# Patient Record
Sex: Male | Born: 1940
Health system: Southern US, Community
[De-identification: ages and names within clinical notes are randomized; demographics above are authoritative.]

## PROBLEM LIST (undated history)

## (undated) DIAGNOSIS — K5720 Diverticulitis of large intestine with perforation and abscess without bleeding: Secondary | ICD-10-CM

## (undated) DIAGNOSIS — I2510 Atherosclerotic heart disease of native coronary artery without angina pectoris: Secondary | ICD-10-CM

## (undated) DIAGNOSIS — E785 Hyperlipidemia, unspecified: Secondary | ICD-10-CM

## (undated) DIAGNOSIS — C679 Malignant neoplasm of bladder, unspecified: Secondary | ICD-10-CM

## (undated) DIAGNOSIS — K5792 Diverticulitis of intestine, part unspecified, without perforation or abscess without bleeding: Secondary | ICD-10-CM

## (undated) DIAGNOSIS — T8859XA Other complications of anesthesia, initial encounter: Secondary | ICD-10-CM

## (undated) DIAGNOSIS — C4490 Unspecified malignant neoplasm of skin, unspecified: Secondary | ICD-10-CM

## (undated) DIAGNOSIS — I6529 Occlusion and stenosis of unspecified carotid artery: Secondary | ICD-10-CM

## (undated) DIAGNOSIS — I739 Peripheral vascular disease, unspecified: Secondary | ICD-10-CM

## (undated) DIAGNOSIS — G458 Other transient cerebral ischemic attacks and related syndromes: Secondary | ICD-10-CM

## (undated) DIAGNOSIS — Z5320 Procedure and treatment not carried out because of patient's decision for unspecified reasons: Secondary | ICD-10-CM

## (undated) DIAGNOSIS — L02211 Cutaneous abscess of abdominal wall: Secondary | ICD-10-CM

## (undated) DIAGNOSIS — IMO0001 Reserved for inherently not codable concepts without codable children: Secondary | ICD-10-CM

## (undated) DIAGNOSIS — I10 Essential (primary) hypertension: Secondary | ICD-10-CM

## (undated) HISTORY — PX: APPENDECTOMY: SHX54

## (undated) HISTORY — DX: Unspecified malignant neoplasm of skin, unspecified: C44.90

## (undated) HISTORY — DX: Cutaneous abscess of abdominal wall: L02.211

## (undated) HISTORY — DX: Diverticulitis of large intestine with perforation and abscess without bleeding: K57.20

## (undated) HISTORY — DX: Peripheral vascular disease, unspecified: I73.9

## (undated) HISTORY — PX: CYSTOSCOPY WITH INJECTION: SHX1424

## (undated) HISTORY — DX: Other transient cerebral ischemic attacks and related syndromes: G45.8

## (undated) HISTORY — DX: Reserved for inherently not codable concepts without codable children: IMO0001

## (undated) HISTORY — DX: Atherosclerotic heart disease of native coronary artery without angina pectoris: I25.10

## (undated) HISTORY — DX: Procedure and treatment not carried out because of patient's decision for unspecified reasons: Z53.20

## (undated) HISTORY — DX: Essential (primary) hypertension: I10

## (undated) HISTORY — DX: Occlusion and stenosis of unspecified carotid artery: I65.29

## (undated) HISTORY — PX: TONSILLECTOMY: SUR1361

## (undated) HISTORY — DX: Hyperlipidemia, unspecified: E78.5

## (undated) HISTORY — PX: ROTATOR CUFF REPAIR: SHX139

## (undated) HISTORY — DX: Diverticulitis of intestine, part unspecified, without perforation or abscess without bleeding: K57.92

## (undated) SURGERY — VIDEO BRONCHOSCOPY WITHOUT FLUORO
Anesthesia: General | Laterality: Right

---

## 1998-03-21 ENCOUNTER — Ambulatory Visit (HOSPITAL_BASED_OUTPATIENT_CLINIC_OR_DEPARTMENT_OTHER): Admission: RE | Admit: 1998-03-21 | Discharge: 1998-03-21 | Payer: Self-pay | Admitting: Urology

## 1998-03-21 ENCOUNTER — Emergency Department (HOSPITAL_COMMUNITY): Admission: EM | Admit: 1998-03-21 | Discharge: 1998-03-21 | Payer: Self-pay | Admitting: Emergency Medicine

## 2000-04-16 HISTORY — PX: CARDIAC CATHETERIZATION: SHX172

## 2000-08-20 ENCOUNTER — Ambulatory Visit (HOSPITAL_COMMUNITY): Admission: RE | Admit: 2000-08-20 | Discharge: 2000-08-20 | Payer: Self-pay | Admitting: Cardiology

## 2005-02-21 ENCOUNTER — Ambulatory Visit (HOSPITAL_BASED_OUTPATIENT_CLINIC_OR_DEPARTMENT_OTHER): Admission: RE | Admit: 2005-02-21 | Discharge: 2005-02-21 | Payer: Self-pay | Admitting: Urology

## 2005-02-21 ENCOUNTER — Encounter (INDEPENDENT_AMBULATORY_CARE_PROVIDER_SITE_OTHER): Payer: Self-pay | Admitting: Specialist

## 2005-02-21 ENCOUNTER — Ambulatory Visit (HOSPITAL_COMMUNITY): Admission: RE | Admit: 2005-02-21 | Discharge: 2005-02-21 | Payer: Self-pay | Admitting: Urology

## 2007-03-24 ENCOUNTER — Ambulatory Visit: Payer: Self-pay | Admitting: Vascular Surgery

## 2008-04-16 DIAGNOSIS — IMO0001 Reserved for inherently not codable concepts without codable children: Secondary | ICD-10-CM

## 2008-04-16 HISTORY — DX: Reserved for inherently not codable concepts without codable children: IMO0001

## 2009-11-21 ENCOUNTER — Ambulatory Visit: Payer: Self-pay | Admitting: Cardiology

## 2010-07-31 ENCOUNTER — Other Ambulatory Visit: Payer: Self-pay | Admitting: Dermatology

## 2010-08-29 NOTE — Procedures (Signed)
CAROTID DUPLEX EXAM   INDICATION:  Bilateral arm pressure differences.   HISTORY:  Diabetes:  No.  Cardiac:  No.  Hypertension:  Yes.  Smoking:  Pipe.  Previous Surgery:  CV History:  Amaurosis Fugax No, Paresthesias No, Hemiparesis No                                       RIGHT             LEFT  Brachial systolic pressure:         158               110  Brachial Doppler waveforms:         Biphasic          Monophasic  Vertebral direction of flow:        Antegrade         Retrograde  DUPLEX VELOCITIES (cm/sec)  CCA peak systolic                   98                81  ECA peak systolic                   122               111  ICA peak systolic                   107               74  ICA end diastolic                   40                30  PLAQUE MORPHOLOGY:                  heterogeneous     heterogeneous  PLAQUE AMOUNT:                      Mild              Mild  PLAQUE LOCATION:                    ICA and ECA   IMPRESSION:  1. Possibly greater than 80% stenosis noted in the proximal left      subclavian artery (623/133).  2. 1-39% stenosis noted in bilateral ICAs.  3. Retrograde left vertebral artery.   ___________________________________________  Janetta Hora Fields, MD   MG/MEDQ  D:  03/24/2007  T:  03/25/2007  Job:  [id number]

## 2010-09-01 NOTE — H&P (Signed)
[place]  Patient:    [name], [name]                        MRN: [id number] Adm. Date:  08/20/00 Attending:  Colleen Can. Deborah Chalk, M.D. Dictator:   Jennet Maduro. Earl Gala, R.N., A.N.P. CC:         Maretta Bees. Vonita Moss, M.D.                         History and Physical  DATE OF BIRTH:  [birth year]  CHIEF COMPLAINT:  Palpitations with subsequent abnormal stress echocardiogram.  HISTORY OF PRESENT ILLNESS:  [name] is a pleasant 70 year old male who basically has had no significant cardiac history except for hypertension.  He currently had not been taking any medicines.  He has had a four to six week period of having increase in palpitations which he describes as a forceful heartbeat as well as tachycardia.  He had a 24 hour Holter monitor placed which basically showed sinus rhythm.  His average heart rate was 84 beats a minute.  There were PACs and PVCs noted, but mainly single ectopics.  There were no atrial runs.  His maximal heart rate was 147.  There were no ST changes.  The only time that the patient noted palpitations that was noted on the diary there were no ectopics on the tracings able to be discerned.  He was referred on for a stress echocardiogram which was performed on Aug 19, 2000. With this he demonstrated very poor exercise tolerance.  There was no EKG changes, yet echocardiographically there was an abnormal wall motion abnormality of the distal septum.  In light of these findings he is now referred for cardiac catheterization.  PAST MEDICAL HISTORY: 1. Hypertension. 2. History of fever in 1946, questionable rheumatic fever in origin. 3. Bladder cancer followed by Dr. Vonita Moss. 4. Diverticulitis.  ALLERGIES:  None.  CURRENT MEDICATIONS:  Toprol XL 50 mg q.d.  FAMILY HISTORY:  Both of his parents are deceased.  Father died age 85 with COPD.  Mother died at 58 with history of hypertension and actually died from lung cancer.  SOCIAL  HISTORY:  He is married.  He is employed at the maintenance at [employer].  There has been no tobacco products for the past four years but previous to that he smoked for 40 years.  There is no alcohol use.  REVIEW OF SYSTEMS:  Otherwise unremarkable.  PHYSICAL EXAMINATION  GENERAL:  He is an alert white male, somewhat thin.  He is in no acute distress.  VITAL SIGNS:  Weight 172 pounds, blood pressure 170/100 sitting, 140/100 standing, heart rate 88 with occasional ectopy, respirations 18.  He is afebrile.  SKIN:  Warm and dry.  Color is unremarkable.  He is sun tanned.  NECK:  Supple.  No JVD.  LUNGS:  Basically clear.  HEART:  Regular rhythm.  There was no murmur or rumble that could be appreciated.  ABDOMEN:  Soft, positive bowel sounds, nontender.  EXTREMITIES:  Without edema.  LABORATORIES:  Currently pending.  Previous cholesterol panel showed total cholesterol 199, HDL 42, LDL 126, triglyceride level 157.  IMPRESSION: 1. Palpitations with a basically negative Holter monitor with subsequent    abnormal stress echocardiogram. 2. Hypertension.  PLAN:  Will continue the Toprol XL 50 mg q.d.  Will place him on aspirin q.d. as well.  He is referred on for elective  cardiac catheterization.  The risks, procedure, and benefits are discussed in full detail and he is going to proceed on Aug 20, 2000. DD:  08/19/00 TD:  08/19/00 Job: [id number]

## 2010-09-01 NOTE — Op Note (Signed)
[name], [name]               ACCOUNT NO.:  [id number]   MEDICAL RECORD NO.:  [id number]          PATIENT TYPE:  AMB   LOCATION:  [facility]                         FACILITY:  [facility]   PHYSICIAN:  Maretta Bees. Vonita Moss, M.D.DATE OF BIRTH:  October 13, 1940   DATE OF PROCEDURE:  02/21/2005  DATE OF DISCHARGE:                                 OPERATIVE REPORT   PREOPERATIVE DIAGNOSIS:  Recurrent bladder carcinoma.   POSTOPERATIVE DIAGNOSIS:  Not given.   PROCEDURE:  Cystoscopy, resection bladder tumor and bilateral retrograde  pyelograms with interpretation.   SURGEON:  Dr. Larey Dresser.   ANESTHESIA:  General.   INDICATIONS:  This 70 year old gentleman has had bladder tumors resected in  November 1998, March 1999 and November 1999 followed by a 6 week course of  therapy with BCG. He has been tumor free until a recent cystoscopy showed a  small papillary tumor that needs resection.   DESCRIPTION OF PROCEDURE:  The patient was brought to the operating room and  placed in lithotomy position. The external genitalia prepped and draped in  the usual fashion. He was cystoscoped, the anterior and prostatic urethras  were unremarkable. The only lesion was a small 1/2 cm papillary tumor on the  anterior wall.   Bilateral retrograde pyelograms were obtained using the cone-tip catheters  and the pyelocaliceal systems were delicate and without filling defects and  the ureters were nonobstructed and without filling defects.   The cold cup bladder biopsy forceps was utilized to completely remove this  1/2 cm tumor on the anterior wall and the biopsy site fulgurated with the  Bugbee electrode. The bladder was emptied, the scope removed and the patient  sent to the recovery room in good condition having tolerated the procedure  well.      Maretta Bees. Vonita Moss, M.D.  Electronically Signed     LJP/MEDQ  D:  02/21/2005  T:  02/21/2005  Job:  [id number]

## 2010-09-01 NOTE — Cardiovascular Report (Signed)
[place]  Patient:    NEEV, MCMAINS                      MRN: [id number] Proc. Date: 08/20/00 Adm. Date:  [id number] Attending:  Eleanora Neighbor                        Cardiac Catheterization  HISTORY:  Mr. Stammer has a history of hypertension and palpitations.  He has a history of previous bladder cancer.  He was referred for catheterization after having an abnormal exercise test with distal septal hypokinesia and poor exercise tolerance.  He had no EKG changes with exercise.  PROCEDURE:  Left heart catheterization with selective coronary angiography and left ventricular angiography with Perclose.  CARDIOLOGIST:  Colleen Can. Deborah Chalk, M.D.  TYPE AND SITE OF ENTRY:  Percutaneous right femoral artery.  CATHETERS:  6-French 4 curved Judkins right and left coronary catheters, 6-French pigtail ventricular guiding catheter.  MEDICATIONS GIVEN PRIOR TO PROCEDURE:  Valium 10 mg p.o.  MEDICATIONS GIVEN DURING PROCEDURE:  Versed 2 mg IV.  COMMENTS:  The patient tolerated the procedure well.  HEMODYNAMIC DATA:  The aortic pressure was 99/70.  LV was 103/8.  There was no aortic valve gradient noted on pullback.  ANGIOGRAPHIC DATA:  LEFT VENTRICULAR ANGIOGRAM:  The left ventricular angiogram was performed in the RAO position.  Overall cardiac size and silhouette were normal.  Left ventricular function is normal.  CORONARY ARTERIES:  The coronary arteries arise and distribute normally. 1. There is some mild narrowing in the right coronary artery that would be    less than 10 to 20%.  It is in the continuation portion of the right    coronary artery just prior to the acute margin. 2. Left main coronary artery is normal. 3. Left circumflex:  The left circumflex has some mild proximal narrowing.    It bifurcates within 1 cm of its origin into a large high obtuse marginal    and a large second obtuse marginal.  There are some mild  irregularities    but no significant focal disease. 4. Left anterior descending: The left anterior descending has mild scattered    irregularities, all being less than 10%; however, mild irregularities and    luminal are noted.  OVERALL IMPRESSION: 1. Normal left ventricular function. 2. Mild coronary artery disease.  DISCUSSION:  These findings would suggest that the degree of coronary disease that Mr. Quinney has does not contribute to any of his symptoms.  He would be at low risk, but it should be noted that he does have irregularities in the vessels. DD:  08/20/00 TD:  08/20/00 Job: [id number]

## 2010-11-20 ENCOUNTER — Encounter: Payer: Self-pay | Admitting: Nurse Practitioner

## 2010-11-27 ENCOUNTER — Encounter: Payer: Self-pay | Admitting: Nurse Practitioner

## 2010-11-27 ENCOUNTER — Ambulatory Visit (INDEPENDENT_AMBULATORY_CARE_PROVIDER_SITE_OTHER): Payer: Medicare Other | Admitting: Nurse Practitioner

## 2010-11-27 VITALS — BP 138/86 | HR 78 | Ht 74.0 in | Wt 159.6 lb

## 2010-11-27 DIAGNOSIS — I2510 Atherosclerotic heart disease of native coronary artery without angina pectoris: Secondary | ICD-10-CM

## 2010-11-27 DIAGNOSIS — E785 Hyperlipidemia, unspecified: Secondary | ICD-10-CM | POA: Insufficient documentation

## 2010-11-27 DIAGNOSIS — G458 Other transient cerebral ischemic attacks and related syndromes: Secondary | ICD-10-CM

## 2010-11-27 DIAGNOSIS — I498 Other specified cardiac arrhythmias: Secondary | ICD-10-CM

## 2010-11-27 DIAGNOSIS — I10 Essential (primary) hypertension: Secondary | ICD-10-CM | POA: Insufficient documentation

## 2010-11-27 DIAGNOSIS — R001 Bradycardia, unspecified: Secondary | ICD-10-CM | POA: Insufficient documentation

## 2010-11-27 MED ORDER — SIMVASTATIN 40 MG PO TABS: 40.0000 mg | ORAL_TABLET | Freq: Every day | ORAL | Status: DC

## 2010-11-27 MED ORDER — METOPROLOL TARTRATE 50 MG PO TABS: 50.0000 mg | ORAL_TABLET | Freq: Two times a day (BID) | ORAL | Status: DC

## 2010-11-27 NOTE — Progress Notes (Signed)
[name] Date of Birth: January 05, 1941   History of Present Illness: [name] is seen today for a one year visit. He is seen for [name]. He is a former patient of [name]. He continues to do well. He denies chest pain or shortness of breath. No left arm pain. Blood pressure has been good at home. He notes some low heart rates but he is totally asymptomatic. He continues to walk 2 miles each day. He is not dizzy. He tolerates his medicines. He just celebrated his 50th wedding anniversary with a trip to [place].  Current Outpatient Prescriptions on File Prior to Visit  Medication Sig Dispense Refill  . aspirin 81 MG tablet Take 81 mg by mouth daily.        Marland Kitchen BIOTIN PO Take by mouth.        . Cyanocobalamin (VITAMIN B 12 PO) Take by mouth.        . fish oil-omega-3 fatty acids 1000 MG capsule Take 2 g by mouth daily.        Marland Kitchen glipiZIDE (GLUCOTROL) 5 MG tablet Take 5 mg by mouth 2 (two) times daily before a meal.        . Multiple Vitamin (MULTIVITAMIN PO) Take by mouth.        . DISCONTD: simvastatin (ZOCOR) 40 MG tablet Take 40 mg by mouth daily.        Marland Kitchen dicyclomine (BENTYL) 20 MG tablet Take 20 mg by mouth as needed.        Marland Kitchen DISCONTD: metoprolol (LOPRESSOR) 50 MG tablet Take 50 mg by mouth 2 (two) times daily.          No Known Allergies  Past Medical History  Diagnosis Date  . Diabetes mellitus   . Hypertension   . CAD (coronary artery disease)     Mild per cath in 2002  . Hyperlipidemia     Is on statin therapy  . Diverticulitis   . Skin cancer   . Hx of bladder cancer   . Normal nuclear stress test 2010    EF 63% with no ischemia  . Subclavian steal syndrome     left; evaluated by [name]; managed medically    Past Surgical History  Procedure Date  . Cardiac catheterization 2002    He had a which showed mild coronary atherosclerosis and normal left ventricular function.    History  Smoking status  . Never Smoker   Smokeless tobacco  . Not on  file    History  Alcohol Use No    Family History  Problem Relation Age of Onset  . Hypertension Mother   . Lung cancer Mother     Review of Systems: The review of systems is as above. No chest pain, shortness of breath. No cough or edema.  All other systems were reviewed and are negative.  Physical Exam: BP 138/86  Pulse 78  Ht 6\' 2"  (1.88 m)  Wt 159 lb 9.6 oz (72.394 kg)  BMI 20.49 kg/m2 Patient is very pleasant and in no acute distress. Skin is warm and dry. Color is normal.  HEENT is unremarkable. Normocephalic/atraumatic. PERRL. Sclera are nonicteric. Neck is supple. No masses. No JVD. Lungs are clear. Cardiac exam shows a regular rate and rhythm. He is bradycardic on exam. Abdomen is soft. Extremities are without edema. Gait and ROM are intact. No gross neurologic deficits noted.  LABORATORY DATA: Recent labs from his primary care are reviewed. Lipids are excellent.  EKG shows sinus bradycardia.   Assessment / Plan:

## 2010-11-27 NOTE — Assessment & Plan Note (Signed)
Lipids are excellent. No change in his therapy.

## 2010-11-27 NOTE — Assessment & Plan Note (Signed)
Blood pressure has been good at home and is satisfactory here today. No change in his medicines. He will continue to monitor.

## 2010-11-27 NOTE — Assessment & Plan Note (Signed)
He has a resting bradycardia. He is on metoprolol. He is totally asymptomatic. He will continue to monitor his heart rate with his blood pressure at home. He will let us know if he has symptoms. For now, no change in his medicines.

## 2010-11-27 NOTE — Assessment & Plan Note (Signed)
Remote cath in 2002. Last nuclear was in 2010. He remains asymptomatic. Will continue with his current medicines. May consider repeat stress testing on return. He remains active and exercises routinely. I will have him see [name] in one year. Patient is agreeable to this plan and will call if any problems develop in the interim.

## 2010-11-27 NOTE — Assessment & Plan Note (Signed)
He was evaluated by [name] back in 2009. He has been managed with blood pressure control and lipid lowering.

## 2010-11-27 NOTE — Patient Instructions (Signed)
Stay on your current medicines Keep up with your current exercise program I will have you see Dr. Marca Ancona in one year Let me know if you have any dizzy spells or lightheadedness

## 2010-11-28 ENCOUNTER — Encounter: Payer: Self-pay | Admitting: Nurse Practitioner

## 2010-11-28 NOTE — Progress Notes (Signed)
Agree with above note. [name] [employer]

## 2011-04-02 ENCOUNTER — Encounter: Payer: Self-pay | Admitting: Nurse Practitioner

## 2011-04-24 DIAGNOSIS — Z87440 Personal history of urinary (tract) infections: Secondary | ICD-10-CM | POA: Diagnosis not present

## 2011-04-24 DIAGNOSIS — R6882 Decreased libido: Secondary | ICD-10-CM | POA: Diagnosis not present

## 2011-04-24 DIAGNOSIS — N401 Enlarged prostate with lower urinary tract symptoms: Secondary | ICD-10-CM | POA: Diagnosis not present

## 2011-04-30 ENCOUNTER — Other Ambulatory Visit: Payer: Self-pay | Admitting: Urology

## 2011-05-07 ENCOUNTER — Encounter (HOSPITAL_COMMUNITY): Payer: Self-pay | Admitting: Pharmacy Technician

## 2011-05-08 ENCOUNTER — Encounter: Payer: Self-pay | Admitting: Nurse Practitioner

## 2011-05-08 ENCOUNTER — Ambulatory Visit (INDEPENDENT_AMBULATORY_CARE_PROVIDER_SITE_OTHER): Payer: Medicare Other | Admitting: Nurse Practitioner

## 2011-05-08 VITALS — BP 134/70 | HR 48 | Ht 74.0 in | Wt 161.0 lb

## 2011-05-08 DIAGNOSIS — E785 Hyperlipidemia, unspecified: Secondary | ICD-10-CM

## 2011-05-08 DIAGNOSIS — I2510 Atherosclerotic heart disease of native coronary artery without angina pectoris: Secondary | ICD-10-CM | POA: Diagnosis not present

## 2011-05-08 DIAGNOSIS — R001 Bradycardia, unspecified: Secondary | ICD-10-CM

## 2011-05-08 DIAGNOSIS — Z01818 Encounter for other preprocedural examination: Secondary | ICD-10-CM

## 2011-05-08 DIAGNOSIS — I10 Essential (primary) hypertension: Secondary | ICD-10-CM

## 2011-05-08 DIAGNOSIS — I498 Other specified cardiac arrhythmias: Secondary | ICD-10-CM

## 2011-05-08 NOTE — Assessment & Plan Note (Signed)
Lipids are excellent on statin therapy.

## 2011-05-08 NOTE — Patient Instructions (Addendum)
I think you are doing well.   We will see you back at your regular time.   Call the [facility] office at [phone] if you have any questions, problems or concerns.

## 2011-05-08 NOTE — Assessment & Plan Note (Signed)
He is doing well from our standpoint. Should be at low risk for his planned TURP.

## 2011-05-08 NOTE — Assessment & Plan Note (Signed)
This is chronic. He is asymptomatic.

## 2011-05-08 NOTE — Assessment & Plan Note (Signed)
He is doing well clinically. Last nuclear was in 2010 and was normal. He is not wanting to have repeat stress testing. He remains very active and exercises regularly. No CHF. No valvular disease. His risk is felt to be low for surgery. It was explained to him however that complications may arise and he understands that.

## 2011-05-08 NOTE — Progress Notes (Signed)
Agree with note.  Should be reasonable risk to undergo TURP.  Does not need pre-op stress with no new symptoms and good exercise tolerance.

## 2011-05-08 NOTE — Assessment & Plan Note (Signed)
Repeat blood pressure by me is good. No change in his therapy.

## 2011-05-08 NOTE — Progress Notes (Signed)
[name] Date of Birth: January 23, 1941 Medical Record #[id number]  History of Present Illness: [name] is seen back today for a preop clearance visit. He is seen for [name]. He is a former patient of [name]. He has a known history of mild nonobstructive CAD from remote cath in 2002. His last stress test was in 2012 and was negative for ischemia with a normal EF. He has had chronic asymptomatic bradycardia. His other problems include hyperlipidemia, HTN and diabetes.  He comes in today. He is doing very well. He continues to walk 2 1/2 miles 5 days a week without any issue. He is not having any chest pain or arm pain. He is not lightheaded or dizzy. He feels good on his medicines. He brings in his labs which are very good. LDL is 51.  He is totally asymptomatic from a cardiac standpoint. He does have BPH and is planning on having a TURP with [name] next week.   Current Outpatient Prescriptions on File Prior to Visit  Medication Sig Dispense Refill  . aspirin 81 MG tablet Take 81 mg by mouth daily after breakfast.       . BIOTIN PO Take 1 tablet by mouth daily.       . Cyanocobalamin (VITAMIN B 12 PO) Take 500 mcg by mouth daily.       Marland Kitchen dicyclomine (BENTYL) 20 MG tablet Take 20 mg by mouth 4 (four) times daily as needed. Bladder spasm       . finasteride (PROSCAR) 5 MG tablet Take 5 mg by mouth daily.      . fish oil-omega-3 fatty acids 1000 MG capsule Take 1 g by mouth daily.       Marland Kitchen glipiZIDE (GLUCOTROL) 5 MG tablet Take 2.5 mg by mouth daily before breakfast.       . metoprolol (LOPRESSOR) 50 MG tablet Take 1 tablet (50 mg total) by mouth 2 (two) times daily.  60 tablet  11  . Multiple Vitamin (MULTIVITAMIN PO) Take 1 tablet by mouth daily.       . simvastatin (ZOCOR) 40 MG tablet Take 40 mg by mouth every evening.      . Tamsulosin HCl (FLOMAX) 0.4 MG CAPS Take 0.4 mg by mouth daily after supper.        No Known Allergies  Past Medical History  Diagnosis Date  .  Diabetes mellitus   . Hypertension   . CAD (coronary artery disease)     Mild per cath in 2002  . Hyperlipidemia     Is on statin therapy  . Diverticulitis   . Skin cancer   . Hx of bladder cancer   . Normal nuclear stress test 2010    EF 63% with no ischemia  . Subclavian steal syndrome     left; evaluated by [name]; managed medically    Past Surgical History  Procedure Date  . Cardiac catheterization 2002    He had a which showed mild coronary atherosclerosis and normal left ventricular function.    History  Smoking status  . Current Some Day Smoker  . Types: Pipe  Smokeless tobacco  . Not on file    History  Alcohol Use No    Family History  Problem Relation Age of Onset  . Hypertension Mother   . Lung cancer Mother     Review of Systems: The review of systems is positive for BPH.  All other systems were reviewed and are negative.  Physical Exam: BP 134/70  Pulse 48  Ht 6\' 2"  (1.88 m)  Wt 161 lb (73.029 kg)  BMI 20.67 kg/m2 Patient is very pleasant and in no acute distress. Skin is warm and dry. Color is normal.  HEENT is unremarkable. Normocephalic/atraumatic. PERRL. Sclera are nonicteric. Neck is supple. No masses. No JVD. Lungs are clear. Cardiac exam shows a regular rate and rhythm. Abdomen is soft. Extremities are without edema. Gait and ROM are intact. No gross neurologic deficits noted.   LABORATORY DATA: EKG today shows sinus bradycardia. Rate of 48. Tracing is unchanged from his prior tracings.    Assessment / Plan:

## 2011-05-14 ENCOUNTER — Encounter (HOSPITAL_COMMUNITY)
Admission: RE | Admit: 2011-05-14 | Discharge: 2011-05-14 | Disposition: A | Discharge status: Home / Self Care | Payer: Medicare Other | Source: Ambulatory Visit | Attending: Urology | Admitting: Urology

## 2011-05-14 ENCOUNTER — Encounter (HOSPITAL_COMMUNITY): Payer: Self-pay

## 2011-05-14 ENCOUNTER — Ambulatory Visit (HOSPITAL_COMMUNITY)
Admission: RE | Admit: 2011-05-14 | Discharge: 2011-05-14 | Disposition: A | Discharge status: Home / Self Care | Payer: Medicare Other | Source: Ambulatory Visit | Attending: Urology | Admitting: Urology

## 2011-05-14 DIAGNOSIS — Z01818 Encounter for other preprocedural examination: Secondary | ICD-10-CM | POA: Diagnosis not present

## 2011-05-14 DIAGNOSIS — J449 Chronic obstructive pulmonary disease, unspecified: Secondary | ICD-10-CM | POA: Diagnosis not present

## 2011-05-14 DIAGNOSIS — Z01811 Encounter for preprocedural respiratory examination: Secondary | ICD-10-CM | POA: Diagnosis not present

## 2011-05-14 DIAGNOSIS — Z01812 Encounter for preprocedural laboratory examination: Secondary | ICD-10-CM | POA: Diagnosis not present

## 2011-05-14 HISTORY — DX: Malignant neoplasm of bladder, unspecified: C67.9

## 2011-05-14 LAB — CBC
MCH: 33.1 pg (ref 26.0–34.0)
MCHC: 33.9 g/dL (ref 30.0–36.0)
MCV: 97.5 fL (ref 78.0–100.0)
Platelets: 261 10*3/uL (ref 150–400)
RDW: 12.8 % (ref 11.5–15.5)
WBC: 7.6 10*3/uL (ref 4.0–10.5)

## 2011-05-14 LAB — BASIC METABOLIC PANEL
Calcium: 9 mg/dL (ref 8.4–10.5)
Creatinine, Ser: 0.79 mg/dL (ref 0.50–1.35)
GFR calc Af Amer: >90 mL/min (ref >90)
GFR calc non Af Amer: 89 mL/min — ABNORMAL LOW (ref >90)

## 2011-05-14 NOTE — Patient Instructions (Signed)
[name]  05/14/2011   Your procedure is scheduled on:   05/17/11   Surgery   [id number]   Thursday  Report to [facility] at 0600 AM.  Call this number if you have problems the morning of surgery: [phone]   [facility]  PST [id number]   Remember:   Do not eat food:After Midnight.  Wednesday NIGHT  May have clear liquids:until Midnight .WEDNESDAY NIGHT   Clear liquids include soda, tea, black coffee, apple or grape juice, broth.  Take these medicines the morning of surgery with A SIP OF WATER: METOPROLOL WITH SIP WATER              NO BLOOD SUGAR MEDICINE AM OF SURGERY   Do not wear jewelry, make-up or nail polish.  Do not wear lotions, powders, or perfumes. You may wear deodorant.  Do not shave 48 hours prior to surgery.  Do not bring valuables to the hospital.  Contacts, dentures or bridgework may not be worn into surgery.  Leave suitcase in the car. After surgery it may be brought to your room.  For patients admitted to the hospital, checkout time is 11:00 AM the day of discharge.   Patients discharged the day of surgery will not be allowed to drive home.  Name and phone number of your driver: wife    [name]  Special Instructions: CHG Shower Use Special Wash: 1/2 bottle night before surgery and 1/2 bottle morning of surgery.   Regular soap face and privates    May shave face day of surgery   Please read over the following fact sheets that you were given: MRSA Information

## 2011-05-14 NOTE — Pre-Procedure Instructions (Signed)
Spoke with Pam at [facility] and asked her to notify Dr Annabell Howells that Mr Godman has not stopped his asa and vitamins- instructed patient no more- last dose this AM

## 2011-05-14 NOTE — H&P (Signed)
e Problems Problems  1. Benign Prostatic Hypertrophy With Urinary Obstruction 600.01 2. History of  Bladder Cancer V10.51 3. Organic Impotence 607.84  History of Present Illness  [name] returns today in f/u.   He was seen in October for f/u of bladder cancer and BPH with BOO.  He was on Rapaflo but was switched to tamsulosin and finasteride for progressive voiding symptoms.  He has not noticed a lot of improvement but has reduced nocturia to 0-1x from q1hr.  His PSA on therapy is 0.9.   His PVR is 57cc.   Past Medical History Problems  1. History of  Acute Urinary Retention 788.20 2. History of  Bladder Cancer V10.51 3. History of  Bladder Neck Contracture 596.0 4. History of  Diabetes Mellitus 250.00 5. History of  Heartburn 787.1 6. History of  Hypercholesterolemia 272.0 7. History of  Hypertension 401.9 8. History of  Pyuria 791.9 9. History of  Skin Cancer V10.83 10. History of  Urinary Tract Infection V13.02  Surgical History Problems  1. History of  Bladder Surgery 2. History of  Cystoscopy With Resection Of Tumor  Current Meds 1. Aspirin 81 MG Oral Tablet; Therapy: (Recorded:31Jan2008) to 2. Biotin TABS; Therapy: (Recorded:16Aug2012) to 3. Finasteride 5 MG Oral Tablet; TAKE 1 TABLET DAILY AS DIRECTED; Therapy: 01Oct2012 to  (Evaluate:26Sep2013)  Requested for: 01Oct2012; Last Rx:01Oct2012 4. Fish Oil CAPS; Therapy: (Recorded:16Aug2012) to 5. GlipiZIDE 5 MG Oral Tablet; Therapy: (Recorded:17May2010) to 6. Loratadine 10 MG Oral Tablet; Therapy: 16Aug2012 to 7. Multi-Vitamin Oral Tablet; Therapy: (Recorded:17May2010) to 8. Simvastatin 40 MG Oral Tablet; Therapy: (Recorded:17May2010) to 9. Tamsulosin HCl 0.4 MG Oral Capsule; TAKE 1 CAPSULE Daily with meals; Therapy: 01Oct2012  to (Evaluate:26Sep2013)  Requested for: 01Oct2012; Last Rx:01Oct2012 10. Toprol XL 50 MG Oral Tablet Extended Release 24 Hour; Therapy: (Recorded:31Jan2008) to 11. Vitamin B-12 ER TBCR;  Therapy: (Recorded:16Aug2012) to  Allergies Medication  1. No Known Drug Allergies  Social History Problems  1. Marital History - Currently Married 2. Tobacco Use V15.82 has smoked a pipe for 50 years Denied  3. History of  Alcohol Use  Review of Systems  Genitourinary: decreased libido.  ENT: nasal passage blockage (worse at night).  Cardiovascular: no leg swelling.  Endocrine: gynecomastia.    Vitals Vital Signs [Data Includes: Last 1 Day]  08Jan2013 09:55AM  Blood Pressure: 143 / 73 Temperature: 97.5 F Heart Rate: 44  Physical Exam Constitutional: Well nourished and well developed . No acute distress.  Pulmonary: No respiratory distress and normal respiratory rhythm and effort.  Cardiovascular: Heart rate and rhythm are normal . No peripheral edema.    Results/Data Urine [Data Includes: Last 1 Day]   08Jan2013  COLOR YELLOW   APPEARANCE CLEAR   SPECIFIC GRAVITY 1.010   pH 7.0   GLUCOSE NEG mg/dL  BILIRUBIN NEG   KETONE NEG mg/dL  BLOOD NEG   PROTEIN NEG mg/dL  UROBILINOGEN 0.2 mg/dL  NITRITE NEG   LEUKOCYTE ESTERASE NEG    The following clinical lab reports were reviewed:  PSA from 12/17 was 0.9.  Flow Rate: Voided 85 ml. A peak flow rate of 53ml/s, mean flow rate of 94ml/s and with a very erratic sawtooth pattern. flow curve .  PVR: Ultrasound PVR 57 ml. Prostate volume is 54cc.    Assessment Assessed  1. Benign Prostatic Hypertrophy With Urinary Obstruction 600.01 2. Urinary Tract Infection V13.02 3. Libido, Decreased 799.81   He has had improvement in the combination therapy but still has a slow  stream. he has a reduced libido on finasteride and nasal stuffiness on the tamsulosin.   Plan Benign Prostatic Hypertrophy With Urinary Obstruction (600.01)  1. Follow-up Month x 3 Office  Follow-up  Requested for: 08Jan2013 2. Complex Uroflowmetry  Requested for: 08Jan2013 3. IPSS Questionnarie  Requested for: 08Jan2013 4. PSA 5. PVR U/S  Requested for:  08Jan2013 Health Maintenance (V70.0)  6. UA With REFLEX  Done: 08Jan2013 09:39AM   I discussed continued medical therapy, CTT and TURP and reviewed the risks and benefits of each options. He has decided to proceed with a TURP.   Time in consultation was with >50% face to face.

## 2011-05-17 ENCOUNTER — Encounter (HOSPITAL_COMMUNITY): Payer: Self-pay | Admitting: *Deleted

## 2011-05-17 ENCOUNTER — Other Ambulatory Visit: Payer: Self-pay | Admitting: Urology

## 2011-05-17 ENCOUNTER — Observation Stay (HOSPITAL_COMMUNITY)
Admission: RE | Admit: 2011-05-17 | Discharge: 2011-05-18 | Disposition: A | Discharge status: Home / Self Care | Payer: Medicare Other | Source: Ambulatory Visit | Attending: Urology | Admitting: Urology

## 2011-05-17 ENCOUNTER — Ambulatory Visit (HOSPITAL_COMMUNITY): Payer: Medicare Other | Admitting: Anesthesiology

## 2011-05-17 ENCOUNTER — Encounter (HOSPITAL_COMMUNITY)
Admission: RE | Disposition: A | Discharge status: Home / Self Care | Payer: Self-pay | Source: Ambulatory Visit | Attending: Urology

## 2011-05-17 ENCOUNTER — Encounter (HOSPITAL_COMMUNITY): Payer: Self-pay | Admitting: Anesthesiology

## 2011-05-17 DIAGNOSIS — E785 Hyperlipidemia, unspecified: Secondary | ICD-10-CM | POA: Diagnosis not present

## 2011-05-17 DIAGNOSIS — E119 Type 2 diabetes mellitus without complications: Secondary | ICD-10-CM | POA: Insufficient documentation

## 2011-05-17 DIAGNOSIS — I10 Essential (primary) hypertension: Secondary | ICD-10-CM | POA: Insufficient documentation

## 2011-05-17 DIAGNOSIS — C679 Malignant neoplasm of bladder, unspecified: Secondary | ICD-10-CM | POA: Insufficient documentation

## 2011-05-17 DIAGNOSIS — Z7982 Long term (current) use of aspirin: Secondary | ICD-10-CM | POA: Insufficient documentation

## 2011-05-17 DIAGNOSIS — N320 Bladder-neck obstruction: Secondary | ICD-10-CM | POA: Diagnosis not present

## 2011-05-17 DIAGNOSIS — N401 Enlarged prostate with lower urinary tract symptoms: Secondary | ICD-10-CM | POA: Diagnosis not present

## 2011-05-17 DIAGNOSIS — N400 Enlarged prostate without lower urinary tract symptoms: Secondary | ICD-10-CM | POA: Diagnosis not present

## 2011-05-17 DIAGNOSIS — Z79899 Other long term (current) drug therapy: Secondary | ICD-10-CM | POA: Diagnosis not present

## 2011-05-17 DIAGNOSIS — N138 Other obstructive and reflux uropathy: Principal | ICD-10-CM | POA: Insufficient documentation

## 2011-05-17 HISTORY — PX: TRANSURETHRAL RESECTION OF PROSTATE: SHX73

## 2011-05-17 LAB — GLUCOSE, CAPILLARY
Glucose-Capillary: 105 mg/dL — ABNORMAL HIGH (ref 70–99)
Glucose-Capillary: 111 mg/dL — ABNORMAL HIGH (ref 70–99)

## 2011-05-17 SURGERY — TRANSURETHRAL RESECTION OF THE PROSTATE WITH GYRUS INSTRUMENTS
Anesthesia: General | Site: Prostate | Wound class: Clean Contaminated

## 2011-05-17 MED ORDER — DOCUSATE SODIUM 100 MG PO CAPS: 100.0000 mg | ORAL_CAPSULE | Freq: Two times a day (BID) | ORAL | Status: AC

## 2011-05-17 MED ORDER — POTASSIUM CHLORIDE IN NACL 20-0.45 MEQ/L-% IV SOLN
INTRAVENOUS | Status: DC
  Administered 2011-05-17: 13:00:00 via INTRAVENOUS
  Filled 2011-05-17 (×5): qty 1000

## 2011-05-17 MED ORDER — ZOLPIDEM TARTRATE 5 MG PO TABS: 5.0000 mg | ORAL_TABLET | Freq: Every evening | ORAL | Status: DC | PRN

## 2011-05-17 MED ORDER — LACTATED RINGERS IV SOLN: INTRAVENOUS | Status: DC

## 2011-05-17 MED ORDER — PROMETHAZINE HCL 25 MG/ML IJ SOLN: 6.2500 mg | INTRAMUSCULAR | Status: DC | PRN

## 2011-05-17 MED ORDER — ONDANSETRON HCL 4 MG/2ML IJ SOLN
INTRAMUSCULAR | Status: DC | PRN
  Administered 2011-05-17: 4 mg via INTRAVENOUS

## 2011-05-17 MED ORDER — METOPROLOL TARTRATE 50 MG PO TABS
50.0000 mg | ORAL_TABLET | Freq: Two times a day (BID) | ORAL | Status: DC
  Administered 2011-05-17 – 2011-05-18 (×2): 50 mg via ORAL
  Filled 2011-05-17 (×4): qty 1

## 2011-05-17 MED ORDER — CIPROFLOXACIN IN D5W 400 MG/200ML IV SOLN
400.0000 mg | INTRAVENOUS | Status: AC
  Administered 2011-05-17: 400 mg via INTRAVENOUS

## 2011-05-17 MED ORDER — HYDROMORPHONE HCL PF 1 MG/ML IJ SOLN: 0.2500 mg | INTRAMUSCULAR | Status: DC | PRN

## 2011-05-17 MED ORDER — SODIUM CHLORIDE 0.9 % IR SOLN
Status: DC | PRN
  Administered 2011-05-17: 15000 mL

## 2011-05-17 MED ORDER — LACTATED RINGERS IV SOLN
INTRAVENOUS | Status: DC | PRN
  Administered 2011-05-17 (×2): via INTRAVENOUS

## 2011-05-17 MED ORDER — MORPHINE SULFATE 2 MG/ML IJ SOLN: 2.0000 mg | INTRAMUSCULAR | Status: DC | PRN

## 2011-05-17 MED ORDER — GLIPIZIDE 2.5 MG HALF TABLET
2.5000 mg | ORAL_TABLET | Freq: Every day | ORAL | Status: DC
  Filled 2011-05-17 (×2): qty 1

## 2011-05-17 MED ORDER — SIMVASTATIN 40 MG PO TABS
40.0000 mg | ORAL_TABLET | Freq: Every day | ORAL | Status: DC
  Administered 2011-05-17: 40 mg via ORAL
  Filled 2011-05-17 (×2): qty 1

## 2011-05-17 MED ORDER — FENTANYL CITRATE 0.05 MG/ML IJ SOLN
INTRAMUSCULAR | Status: DC | PRN
  Administered 2011-05-17 (×3): 25 ug via INTRAVENOUS
  Administered 2011-05-17: 100 ug via INTRAVENOUS
  Administered 2011-05-17: 25 ug via INTRAVENOUS
  Administered 2011-05-17: 50 ug via INTRAVENOUS

## 2011-05-17 MED ORDER — PROPOFOL 10 MG/ML IV BOLUS
INTRAVENOUS | Status: DC | PRN
  Administered 2011-05-17: 140 mg via INTRAVENOUS

## 2011-05-17 MED ORDER — CIPROFLOXACIN HCL 250 MG PO TABS: 250.0000 mg | ORAL_TABLET | Freq: Two times a day (BID) | ORAL | Status: AC

## 2011-05-17 MED ORDER — HYDROCODONE-ACETAMINOPHEN 5-325 MG PO TABS: 1.0000 | ORAL_TABLET | Freq: Four times a day (QID) | ORAL | Status: AC | PRN

## 2011-05-17 MED ORDER — INSULIN ASPART 100 UNIT/ML ~~LOC~~ SOLN: 0.0000 [IU] | Freq: Three times a day (TID) | SUBCUTANEOUS | Status: DC

## 2011-05-17 MED ORDER — ACETAMINOPHEN 10 MG/ML IV SOLN
INTRAVENOUS | Status: DC | PRN
  Administered 2011-05-17: 1000 mg via INTRAVENOUS

## 2011-05-17 MED ORDER — ONDANSETRON HCL 4 MG/2ML IJ SOLN: 4.0000 mg | INTRAMUSCULAR | Status: DC | PRN

## 2011-05-17 MED ORDER — DOCUSATE SODIUM 100 MG PO CAPS
100.0000 mg | ORAL_CAPSULE | Freq: Two times a day (BID) | ORAL | Status: DC
  Administered 2011-05-17 – 2011-05-18 (×3): 100 mg via ORAL
  Filled 2011-05-17 (×4): qty 1

## 2011-05-17 MED ORDER — CIPROFLOXACIN HCL 250 MG PO TABS
250.0000 mg | ORAL_TABLET | Freq: Two times a day (BID) | ORAL | Status: DC
  Administered 2011-05-17 – 2011-05-18 (×3): 250 mg via ORAL
  Filled 2011-05-17 (×4): qty 1

## 2011-05-17 MED ORDER — ACETAMINOPHEN 325 MG PO TABS: 650.0000 mg | ORAL_TABLET | ORAL | Status: DC | PRN

## 2011-05-17 MED ORDER — HYDROCODONE-ACETAMINOPHEN 5-325 MG PO TABS
1.0000 | ORAL_TABLET | ORAL | Status: DC | PRN
  Administered 2011-05-17: 1 via ORAL
  Filled 2011-05-17: qty 1

## 2011-05-17 MED ORDER — HYOSCYAMINE SULFATE 0.125 MG SL SUBL
0.1250 mg | SUBLINGUAL_TABLET | SUBLINGUAL | Status: DC | PRN
  Filled 2011-05-17: qty 1

## 2011-05-17 MED ORDER — MEPERIDINE HCL 50 MG/ML IJ SOLN: 6.2500 mg | INTRAMUSCULAR | Status: DC | PRN

## 2011-05-17 SURGICAL SUPPLY — 24 items
BAG URINE DRAINAGE (UROLOGICAL SUPPLIES) ×1 IMPLANT
BAG URO CATCHER STRL LF (DRAPE) ×2 IMPLANT
BLADE SURG 15 STRL LF DISP TIS (BLADE) IMPLANT
BLADE SURG 15 STRL SS (BLADE)
CATH FOLEY 3WAY 30CC 22FR (CATHETERS) ×1 IMPLANT
CLOTH BEACON ORANGE TIMEOUT ST (SAFETY) ×2 IMPLANT
DRAPE CAMERA CLOSED 9X96 (DRAPES) ×2 IMPLANT
ELECT BUTTON HF 24-28F 2 30DE (ELECTRODE) ×1 IMPLANT
ELECT HF RESECT BIPO 24F 45 ND (CUTTING LOOP) ×2 IMPLANT
ELECT LOOP MED HF 24F 12D (CUTTING LOOP) ×2 IMPLANT
ELECT REM PT RETURN 9FT ADLT (ELECTROSURGICAL) ×2
ELECTRODE REM PT RTRN 9FT ADLT (ELECTROSURGICAL) ×1 IMPLANT
GLOVE SURG SS PI 8.0 STRL IVOR (GLOVE) ×2 IMPLANT
GOWN PREVENTION PLUS XLARGE (GOWN DISPOSABLE) ×2 IMPLANT
GOWN STRL REIN XL XLG (GOWN DISPOSABLE) ×2 IMPLANT
HOLDER FOLEY CATH W/STRAP (MISCELLANEOUS) ×1 IMPLANT
IV NS IRRIG 3000ML ARTHROMATIC (IV SOLUTION) ×9 IMPLANT
KIT ASPIRATION TUBING (SET/KITS/TRAYS/PACK) ×2 IMPLANT
MANIFOLD NEPTUNE II (INSTRUMENTS) ×2 IMPLANT
PACK CYSTO (CUSTOM PROCEDURE TRAY) ×2 IMPLANT
SUT ETHILON 3 0 PS 1 (SUTURE) IMPLANT
SYR 30ML LL (SYRINGE) ×1 IMPLANT
SYRINGE IRR TOOMEY STRL 70CC (SYRINGE) ×1 IMPLANT
TUBING CONNECTING 10 (TUBING) ×2 IMPLANT

## 2011-05-17 NOTE — Transfer of Care (Signed)
Immediate Anesthesia Transfer of Care Note  Patient: [name]  Procedure(s) Performed:  TRANSURETHRAL RESECTION OF THE PROSTATE WITH GYRUS INSTRUMENTS  Patient Location: PACU  Anesthesia Type: General  Level of Consciousness: awake, alert , oriented and patient cooperative  Airway & Oxygen Therapy: Patient Spontanous Breathing and Patient connected to face mask oxygen  Post-op Assessment: Report given to PACU RN and Post -op Vital signs reviewed and stable  Post vital signs: Reviewed and stable  Complications: No apparent anesthesia complications

## 2011-05-17 NOTE — Brief Op Note (Signed)
05/17/2011  10:53 AM  PATIENT:  [name]  71 y.o. male  PRE-OPERATIVE DIAGNOSIS:  Benign Prostatic Hyperthrophy  POST-OPERATIVE DIAGNOSIS:  Benign Prostatic Hyperthrophy with obstruction PROCEDURE:  Procedure(s): TRANSURETHRAL RESECTION OF THE PROSTATE WITH GYRUS INSTRUMENTS  SURGEON:  Surgeon(s): Anner Crete, MD  PHYSICIAN ASSISTANT:   ASSISTANTS: none   ANESTHESIA:   general  EBL:  Total I/O In: 1200 [I.V.:1200] Out: -500   BLOOD ADMINISTERED:none  DRAINS: Urinary Catheter (Foley)   LOCAL MEDICATIONS USED:  NONE  SPECIMEN:  Source of Specimen:  Prostate chips  DISPOSITION OF SPECIMEN:  PATHOLOGY  COUNTS:  YES  TOURNIQUET:  * No tourniquets in log *  DICTATION: .Other Dictation: Dictation Number [id number]  PLAN OF CARE: Admit for overnight observation  PATIENT DISPOSITION:  PACU - hemodynamically stable.   Delay start of Pharmacological VTE agent (>24hrs) due to surgical blood loss or risk of bleeding:  {YES/NO/NOT APPLICABLE:20182

## 2011-05-17 NOTE — Anesthesia Postprocedure Evaluation (Signed)
  Anesthesia Post-op Note  Patient: [name]  Procedure(s) Performed:  TRANSURETHRAL RESECTION OF THE PROSTATE WITH GYRUS INSTRUMENTS  Patient Location: PACU  Anesthesia Type: General  Level of Consciousness: awake and alert   Airway and Oxygen Therapy: Patient Spontanous Breathing  Post-op Pain: mild  Post-op Assessment: Post-op Vital signs reviewed, Patient's Cardiovascular Status Stable, Respiratory Function Stable, Patent Airway and No signs of Nausea or vomiting  Post-op Vital Signs: stable  Complications: No apparent anesthesia complications

## 2011-05-17 NOTE — Anesthesia Preprocedure Evaluation (Addendum)
Anesthesia Evaluation  Patient identified by MRN, date of birth, ID band Patient awake    Reviewed: Allergy & Precautions, H&P , NPO status , Patient's Chart, lab work & pertinent test results  Airway Mallampati: III TM Distance: >3 FB Neck ROM: Full    Dental No notable dental hx.    Pulmonary neg pulmonary ROS, COPDCurrent Smoker,  clear to auscultation  Pulmonary exam normal       Cardiovascular hypertension, + CAD (mild on cath 2002) and neg cardio ROS Regular Normal Subclavian steal syndrome    Neuro/Psych Negative Neurological ROS  Negative Psych ROS   GI/Hepatic negative GI ROS, Neg liver ROS,   Endo/Other  Negative Endocrine ROSDiabetes mellitus-, Oral Hypoglycemic Agents  Renal/GU negative Renal ROS  Genitourinary negative   Musculoskeletal negative musculoskeletal ROS (+)   Abdominal   Peds negative pediatric ROS (+)  Hematology negative hematology ROS (+)   Anesthesia Other Findings   Reproductive/Obstetrics negative OB ROS                          Anesthesia Physical Anesthesia Plan  ASA: III  Anesthesia Plan: General   Post-op Pain Management:    Induction: Intravenous  Airway Management Planned:   Additional Equipment:   Intra-op Plan:   Post-operative Plan: Extubation in OR  Informed Consent: I have reviewed the patients History and Physical, chart, labs and discussed the procedure including the risks, benefits and alternatives for the proposed anesthesia with the patient or authorized representative who has indicated his/her understanding and acceptance.   Dental advisory given  Plan Discussed with: CRNA  Anesthesia Plan Comments:        Anesthesia Quick Evaluation

## 2011-05-17 NOTE — Interval H&P Note (Signed)
History and Physical Interval Note:  05/17/2011 8:06 AM  [name]  has presented today for surgery, with the diagnosis of Benign Prostatic Hyperthrophy  The various methods of treatment have been discussed with the patient and family. After consideration of risks, benefits and other options for treatment, the patient has consented to  Procedure(s): TRANSURETHRAL RESECTION OF THE PROSTATE WITH GYRUS INSTRUMENTS as a surgical intervention .  The patients' history has been reviewed, patient examined, no change in status, stable for surgery.  I have reviewed the patients' chart and labs.  Questions were answered to the patient's satisfaction.     Legend Tumminello J

## 2011-05-17 NOTE — Preoperative (Signed)
Beta Blockers   Reason not to administer Beta Blockers:Not Applicable 

## 2011-05-17 NOTE — Progress Notes (Signed)
   Patient ID: [name], male   DOB: 27-Mar-1941, 71 y.o.   MRN: [id number]  He is doing well but does have some pain when he sits up.   His urine is faintly pink on irrigation.  If the urine is clear tomorrow, he can have the foley out.  If the urine is still bloody, it would be best to leave the foley an additional day.

## 2011-05-18 LAB — BASIC METABOLIC PANEL
BUN: 11 mg/dL (ref 6–23)
CO2: 25 mEq/L (ref 19–32)
Calcium: 8.6 mg/dL (ref 8.4–10.5)
GFR calc non Af Amer: 88 mL/min — ABNORMAL LOW (ref >90)
Glucose, Bld: 97 mg/dL (ref 70–99)
Sodium: 134 mEq/L — ABNORMAL LOW (ref 135–145)

## 2011-05-18 LAB — GLUCOSE, CAPILLARY: Glucose-Capillary: 104 mg/dL — ABNORMAL HIGH (ref 70–99)

## 2011-05-18 MED ORDER — CIPROFLOXACIN HCL 500 MG PO TABS
500.0000 mg | ORAL_TABLET | Freq: Two times a day (BID) | ORAL | Status: DC
  Filled 2011-05-18: qty 1

## 2011-05-18 NOTE — Discharge Summary (Signed)
Date of admission: 05/17/2011  Date of discharge: 05/18/2011  Admission diagnosis: BPH, BOO  Discharge diagnosis: same  Secondary diagnoses: bladder cancer, HTN, hyperlipidemia, DM,   History and Physical: For full details, please see admission history and physical. Briefly, [name] is a 71 y.o. year old patient with bladder cancer and BPH with BOO. He was on Rapaflo but was switched to tamsulosin and finasteride for progressive voiding symptoms. He has not noticed a lot of improvement but has reduced nocturia to 0-1x from q1hr. His PSA on therapy is 0.9. His PVR is 57cc.  He was therefore scheduled for TURP.   Hospital Course: Pt was admitted and taken to the OR on 05/17/11 for TURP.  Pt tolerated the procedure well and was hemodynamically stable immediately post op.  He was transferred to the floor without difficulty.  Pt has remained stable throughout the post op course.  He has been able to tolerate a regular diet and ambulate without difficulty.  His foley was discontinued on POD 1 and he was able to void.  He is felt stable for d/c home.  Laboratory values:  Basename 05/18/11 0435  HGB 13.0  HCT 37.5*    Basename 05/18/11 0435  CREATININE 0.81    Disposition: Home  Discharge instruction: The patient was instructed to be ambulatory but told to refrain from heavy lifting, strenuous activity, or driving.   Discharge medications:  Medication List  As of 05/18/2011 10:53 AM   START taking these medications         ciprofloxacin 250 MG tablet   Commonly known as: CIPRO   Take 1 tablet (250 mg total) by mouth 2 (two) times daily.      docusate sodium 100 MG capsule   Commonly known as: COLACE   Take 1 capsule (100 mg total) by mouth 2 (two) times daily.      HYDROcodone-acetaminophen 5-325 MG per tablet   Commonly known as: NORCO   Take 1 tablet by mouth every 6 (six) hours as needed for pain.         CONTINUE taking these medications         glipiZIDE 5 MG tablet     Commonly known as: GLUCOTROL      metoprolol 50 MG tablet   Commonly known as: LOPRESSOR   Take 1 tablet (50 mg total) by mouth 2 (two) times daily.      simvastatin 40 MG tablet   Commonly known as: ZOCOR         STOP taking these medications         aspirin 81 MG tablet      BIOTIN PO      dicyclomine 20 MG tablet      finasteride 5 MG tablet      fish oil-omega-3 fatty acids 1000 MG capsule      ibuprofen 200 MG tablet      MULTIVITAMIN PO      Tamsulosin HCl 0.4 MG Caps      VITAMIN B 12 PO          Where to get your medications    These are the prescriptions that you need to pick up.   You may get these medications from any pharmacy.         ciprofloxacin 250 MG tablet   docusate sodium 100 MG capsule   HYDROcodone-acetaminophen 5-325 MG per tablet            Followup:  Follow-up Information  Follow up with Anner Crete, MD on 05/28/2011. (8)    Contact information:   [address] [place] [address] [place] [address] [phone]

## 2011-05-18 NOTE — Op Note (Signed)
[name], [name] NO.:  [id number]  MEDICAL RECORD NO.:  [id number]  LOCATION:  1403                         FACILITY:  [facility]  PHYSICIAN:  Excell Seltzer. Annabell Howells, M.D.    DATE OF BIRTH:  [birth year]  DATE OF PROCEDURE:  05/17/2011 DATE OF DISCHARGE:                              OPERATIVE REPORT   Patient of Dr. Bjorn Pippin.  PROCEDURE:  Transurethral resection of prostate.  PREOPERATIVE DIAGNOSIS:  Benign prostatic hypertrophy with bladder outlet obstruction.  POSTOPERATIVE DIAGNOSIS:  Benign prostatic hypertrophy with bladder outlet obstruction.  SURGEON:  Excell Seltzer. Annabell Howells, M.D.  ANESTHESIA:  General.  SPECIMEN:  Prostate chips.  DRAINS:  22-French 3-way Foley catheter.  BLOOD LOSS:  Approximately 200 cc.  COMPLICATIONS:  None.  INDICATIONS:  [name] is a 71 year old white male with a history of BPH with bladder outlet obstruction.  He is on maximal medical therapy and has elected to proceed with TURP for further symptom improvement and to be able to get off medications.  FINDINGS OF PROCEDURE:  He was taken to operating room, where general anesthetic was induced.  He was given Cipro.  He has been placed in lithotomy position and fitted with PAS hose.  His perineum and genitalia were prepped with Betadine solution, he was draped in usual sterile fashion.  Cystoscopy was performed using a 22-French scope and 12 and 70-degree lenses.  Examination revealed a normal urethra.  The external sphincter was intact.  The prostatic urethra had bilobar hyperplasia with coaptation and obstruction, and the bladder neck was elevated as well contributing the obstruction.  Examination of bladder revealed the ureteral orifices in the normal anatomic position.  No mucosal lesions were identified.  He had moderate trabeculation.  An approximately 3 cm diverticulum on the right posterior bladder wall.  Once cystoscopy had been completed, the urethra was calibrated to  30- [place] with [employer] sounds and a 28-French continuous flow resectoscope sheath was inserted.  Saline was used as the irrigant with the gyrus bipolar equipment.  The scope was fitted with an Wandra Scot handle, 12-degree lens, and bipolar loop; and prostatic resection was performed.  The bladder neck was resected from 5-7 o'clock down to the capsular fibers.  The floor of the prostate was then resected out to and alongside of the verumontanum.  The right lobe of the prostate was resected from bladder neck to apex out to the capsular fibers.  This was followed by the left lobe in a similar fashion.  The bladder was evacuated free of chips and initial hemostasis was performed.  Residual, apical, and anterior tissue was then resected and those chips were removed.  Some residual lateral tissue was resected on both sides.  Those chips were removed.  Hemostasis was achieved.  Final inspection revealed a widely patent prostatic urethra.  The ureteral orifices were intact as was the external sphincter.  At this point, the resectoscope was removed.  No retained chips or active bleeding was noted.  The scope was removed.  Pressure on the bladder produced an excellent stream.  A 22-French 3-way Foley catheter was placed with the aid of a catheter guide.  The balloon was filled with  30 mL sterile fluid.  The catheter was then hand irrigated with clear return and then placed on continuous irrigation and straight drainage.  The patient was taken down from lithotomy position.  His anesthetic was reversed.  He was moved to recovery room in stable condition.  There were no complications.     Excell Seltzer. Annabell Howells, M.D.     JJW/MEDQ  D:  05/17/2011  T:  05/18/2011  Job:  [id number]

## 2011-05-18 NOTE — Progress Notes (Signed)
1 Day Post-Op Subjective: Patient reports tolerating PO and pain control good.  Objective: Vital signs in last 24 hours: Temp:  [97.4 F (36.3 C)-98.7 F (37.1 C)] 98.5 F (36.9 C) (02/01 0515) Pulse Rate:  [43-55] 49  (02/01 0515) Resp:  [14-16] 16  (02/01 0515) BP: (144-169)/(62-72) 161/65 mmHg (02/01 0515) SpO2:  [94 %-100 %] 95 % (02/01 0515) Weight:  [72.8 kg (160 lb 7.9 oz)] 72.8 kg (160 lb 7.9 oz) (01/31 1044)  Intake/Output from previous day: 01/31 0701 - 02/01 0700 In: 3165 [P.O.:680; I.V.:2485] Out: -1060  Intake/Output this shift: Total I/O In: 240 [P.O.:240] Out: -   Physical Exam:  General:alert, cooperative and no distress GI: soft, non tender, normal bowel sounds Cardiac: RRR Lungs: CTA Urine: pink Ext: warm  Lab Results:  Basename 05/18/11 0435  HGB 13.0  HCT 37.5*   BMET  Basename 05/18/11 0435  NA 134*  K 3.9  CL 101  CO2 25  GLUCOSE 97  BUN 11  CREATININE 0.81  CALCIUM 8.6   No results found for this basename: LABPT:3,INR:3 in the last 72 hours No results found for this basename: LABURIN:1 in the last 72 hours Results for orders placed during the hospital encounter of 05/14/11  SURGICAL PCR SCREEN     Status: Normal   Collection Time   05/14/11  2:00 PM      Component Value Range Status Comment   MRSA, PCR NEGATIVE  NEGATIVE  Final    Staphylococcus aureus NEGATIVE  NEGATIVE  Final      Assessment/Plan: 1 Day Post-Op Procedure(s) (LRB): TRANSURETHRAL RESECTION OF THE PROSTATE WITH GYRUS INSTRUMENTS (N/A)  Doing well Ambulate D/C foley D/C home if able to void    LOS: 1 day   YARBROUGH,Min Tunnell G. 05/18/2011, 9:20 AM

## 2011-05-21 ENCOUNTER — Encounter (HOSPITAL_COMMUNITY): Payer: Self-pay | Admitting: Urology

## 2011-05-22 DIAGNOSIS — N401 Enlarged prostate with lower urinary tract symptoms: Secondary | ICD-10-CM | POA: Diagnosis not present

## 2011-05-28 DIAGNOSIS — R82998 Other abnormal findings in urine: Secondary | ICD-10-CM | POA: Diagnosis not present

## 2011-05-28 DIAGNOSIS — N401 Enlarged prostate with lower urinary tract symptoms: Secondary | ICD-10-CM | POA: Diagnosis not present

## 2011-07-23 DIAGNOSIS — N401 Enlarged prostate with lower urinary tract symptoms: Secondary | ICD-10-CM | POA: Diagnosis not present

## 2011-07-30 DIAGNOSIS — L821 Other seborrheic keratosis: Secondary | ICD-10-CM | POA: Diagnosis not present

## 2011-07-30 DIAGNOSIS — L570 Actinic keratosis: Secondary | ICD-10-CM | POA: Diagnosis not present

## 2011-07-30 DIAGNOSIS — Z85828 Personal history of other malignant neoplasm of skin: Secondary | ICD-10-CM | POA: Diagnosis not present

## 2011-10-10 DIAGNOSIS — E119 Type 2 diabetes mellitus without complications: Secondary | ICD-10-CM | POA: Diagnosis not present

## 2011-10-10 DIAGNOSIS — I10 Essential (primary) hypertension: Secondary | ICD-10-CM | POA: Diagnosis not present

## 2011-10-10 DIAGNOSIS — I2510 Atherosclerotic heart disease of native coronary artery without angina pectoris: Secondary | ICD-10-CM | POA: Diagnosis not present

## 2011-10-10 DIAGNOSIS — Z8551 Personal history of malignant neoplasm of bladder: Secondary | ICD-10-CM | POA: Diagnosis not present

## 2011-10-10 DIAGNOSIS — R972 Elevated prostate specific antigen [PSA]: Secondary | ICD-10-CM | POA: Diagnosis not present

## 2011-10-10 DIAGNOSIS — F17200 Nicotine dependence, unspecified, uncomplicated: Secondary | ICD-10-CM | POA: Diagnosis not present

## 2011-10-10 DIAGNOSIS — E785 Hyperlipidemia, unspecified: Secondary | ICD-10-CM | POA: Diagnosis not present

## 2011-11-23 ENCOUNTER — Encounter: Payer: Self-pay | Admitting: Cardiology

## 2011-11-23 ENCOUNTER — Ambulatory Visit (INDEPENDENT_AMBULATORY_CARE_PROVIDER_SITE_OTHER): Payer: Medicare Other | Admitting: Cardiology

## 2011-11-23 VITALS — BP 145/76 | HR 47 | Ht 74.0 in | Wt 161.1 lb

## 2011-11-23 DIAGNOSIS — G458 Other transient cerebral ischemic attacks and related syndromes: Secondary | ICD-10-CM | POA: Diagnosis not present

## 2011-11-23 DIAGNOSIS — E785 Hyperlipidemia, unspecified: Secondary | ICD-10-CM

## 2011-11-23 DIAGNOSIS — I10 Essential (primary) hypertension: Secondary | ICD-10-CM

## 2011-11-23 DIAGNOSIS — F17200 Nicotine dependence, unspecified, uncomplicated: Secondary | ICD-10-CM | POA: Diagnosis not present

## 2011-11-23 MED ORDER — SIMVASTATIN 40 MG PO TABS: 40.0000 mg | ORAL_TABLET | Freq: Every evening | ORAL | Status: DC

## 2011-11-23 MED ORDER — METOPROLOL TARTRATE 50 MG PO TABS: 50.0000 mg | ORAL_TABLET | Freq: Two times a day (BID) | ORAL | Status: DC

## 2011-11-23 NOTE — Patient Instructions (Addendum)
Continue current medications as listed.  Follow up in 1 year with Dr Shirlee Latch.  You will receive a letter in the mail 2 months before you are due.  Please call us when you receive this letter to schedule your follow up appointment.

## 2011-11-25 DIAGNOSIS — F17200 Nicotine dependence, unspecified, uncomplicated: Secondary | ICD-10-CM | POA: Insufficient documentation

## 2011-11-25 NOTE — Assessment & Plan Note (Signed)
BP mildly elevated today, has been normal in the past.  No changes today.

## 2011-11-25 NOTE — Assessment & Plan Note (Signed)
I strongly encouraged [name] to quit smoking.  He still smokes a pipe.

## 2011-11-25 NOTE — Assessment & Plan Note (Signed)
Left subclavian stenosis with doppler evidence for steal syndrome.  No evidence for cerebral ischemia, no arm claudication.  Continue to manage medically.  I will get a doppler evaluation of the carotids and other great vessels.

## 2011-11-25 NOTE — Assessment & Plan Note (Signed)
Goal LDL < 70 with vascular disease.  LDL at goal when checked in 6/13.

## 2011-11-25 NOTE — Progress Notes (Signed)
Patient ID: [name], male   DOB: Jul 15, 1940, 71 y.o.   MRN: [id number] PCP: Dr. Clovis Riley in [place]  71 yo with history of subclavian steal syndrome and diabetes presents for cardiology followup.  He has been seen by Dr. Deborah Chalk in the past and is seen by me for the first time today.  He had a cath in 2002 with minimal disease.  He has left subclavian stenosis with evidence for steal by doppler imaging.  He does not have symptoms of arm claudication or cerebral ischemia.  No chest pain, no exertional dyspnea.  He is active, walking 2+ miles/day.  No lightheadedness or syncope.    Labs (6/13): K 4.7, creatinine 0.8, hgbA1c 6.3, LDL 41, HDL 46  PMH: 1. DM 2. HTN 3. CAD: Mild on cath in 2002.  Normal myoview in 2010.  4. Hyperlipidemia 5. Subclavian stenosis on left with evidence for steal, managed medically.  6. Mild bradycardia 7. TURP 1/13 8. Bladder cancer  SH: Nonsmoker.  Retired, lives in [place].  Married. Smokes pipe 3-4 times a day.   FH: No premature CAD.   ROS: All systems reviewed and negative except as per HPI.   Current Outpatient Prescriptions  Medication Sig Dispense Refill  . aspirin 81 MG tablet Take 81 mg by mouth daily.      Marland Kitchen BIOTIN PO Take by mouth as directed.      . Cyanocobalamin (VITAMIN B 12 PO) Take by mouth as directed.      . dicyclomine (BENTYL) 20 MG tablet Take 20 mg by mouth as directed.      . fish oil-omega-3 fatty acids 1000 MG capsule Take 2 g by mouth daily.      Marland Kitchen glipiZIDE (GLUCOTROL) 5 MG tablet Take 2.5 mg by mouth daily before breakfast.       . metoprolol (LOPRESSOR) 50 MG tablet Take 1 tablet (50 mg total) by mouth 2 (two) times daily.  30 tablet  3  . Multiple Vitamin (MULTIVITAMIN) tablet Take 1 tablet by mouth daily.      . simvastatin (ZOCOR) 40 MG tablet Take 1 tablet (40 mg total) by mouth every evening.  30 tablet  3    BP 145/76  Pulse 47  Ht 6\' 2"  (1.88 m)  Wt 161 lb 1.9 oz (73.084 kg)  BMI 20.69 kg/m2 General:  NAD Neck: No JVD, no thyromegaly or thyroid nodule.  Lungs: Clear to auscultation bilaterally with normal respiratory effort. CV: Nondisplaced PMI.  Heart regular S1/S2, no S3/S4, no murmur.  No peripheral edema.  Murmur across upper left precordium, left shoulder, left side of the neck.  Normal pedal pulses.  Abdomen: Soft, nontender, no hepatosplenomegaly, no distention.  Neurologic: Alert and oriented x 3.  Psych: Normal affect. Extremities: No clubbing or cyanosis.

## 2011-12-04 ENCOUNTER — Encounter (INDEPENDENT_AMBULATORY_CARE_PROVIDER_SITE_OTHER): Payer: Medicare Other

## 2011-12-04 DIAGNOSIS — I6529 Occlusion and stenosis of unspecified carotid artery: Secondary | ICD-10-CM

## 2011-12-04 DIAGNOSIS — G458 Other transient cerebral ischemic attacks and related syndromes: Secondary | ICD-10-CM

## 2011-12-10 ENCOUNTER — Telehealth: Payer: Self-pay | Admitting: Cardiology

## 2011-12-10 NOTE — Telephone Encounter (Signed)
Pt rtn call to [place],  [phone]

## 2011-12-10 NOTE — Telephone Encounter (Signed)
Spoke with pt about recent carotid results 

## 2012-01-21 DIAGNOSIS — I10 Essential (primary) hypertension: Secondary | ICD-10-CM | POA: Diagnosis not present

## 2012-01-21 DIAGNOSIS — E119 Type 2 diabetes mellitus without complications: Secondary | ICD-10-CM | POA: Diagnosis not present

## 2012-01-21 DIAGNOSIS — Z23 Encounter for immunization: Secondary | ICD-10-CM | POA: Diagnosis not present

## 2012-01-21 DIAGNOSIS — E785 Hyperlipidemia, unspecified: Secondary | ICD-10-CM | POA: Diagnosis not present

## 2012-02-01 DIAGNOSIS — E119 Type 2 diabetes mellitus without complications: Secondary | ICD-10-CM | POA: Diagnosis not present

## 2012-02-01 DIAGNOSIS — H2510 Age-related nuclear cataract, unspecified eye: Secondary | ICD-10-CM | POA: Diagnosis not present

## 2012-02-20 DIAGNOSIS — H2510 Age-related nuclear cataract, unspecified eye: Secondary | ICD-10-CM | POA: Diagnosis not present

## 2012-02-27 DIAGNOSIS — H2510 Age-related nuclear cataract, unspecified eye: Secondary | ICD-10-CM | POA: Diagnosis not present

## 2012-04-22 DIAGNOSIS — I10 Essential (primary) hypertension: Secondary | ICD-10-CM | POA: Diagnosis not present

## 2012-04-22 DIAGNOSIS — E785 Hyperlipidemia, unspecified: Secondary | ICD-10-CM | POA: Diagnosis not present

## 2012-04-22 DIAGNOSIS — E119 Type 2 diabetes mellitus without complications: Secondary | ICD-10-CM | POA: Diagnosis not present

## 2012-04-30 DIAGNOSIS — N35919 Unspecified urethral stricture, male, unspecified site: Secondary | ICD-10-CM | POA: Diagnosis not present

## 2012-04-30 DIAGNOSIS — N401 Enlarged prostate with lower urinary tract symptoms: Secondary | ICD-10-CM | POA: Diagnosis not present

## 2012-04-30 DIAGNOSIS — Z8551 Personal history of malignant neoplasm of bladder: Secondary | ICD-10-CM | POA: Diagnosis not present

## 2012-07-03 DIAGNOSIS — Z961 Presence of intraocular lens: Secondary | ICD-10-CM | POA: Diagnosis not present

## 2012-07-25 DIAGNOSIS — H1030 Unspecified acute conjunctivitis, unspecified eye: Secondary | ICD-10-CM | POA: Diagnosis not present

## 2012-07-28 DIAGNOSIS — Z85828 Personal history of other malignant neoplasm of skin: Secondary | ICD-10-CM | POA: Diagnosis not present

## 2012-07-28 DIAGNOSIS — L739 Follicular disorder, unspecified: Secondary | ICD-10-CM | POA: Diagnosis not present

## 2012-07-28 DIAGNOSIS — D0420 Carcinoma in situ of skin of unspecified ear and external auricular canal: Secondary | ICD-10-CM | POA: Diagnosis not present

## 2012-07-28 DIAGNOSIS — D485 Neoplasm of uncertain behavior of skin: Secondary | ICD-10-CM | POA: Diagnosis not present

## 2012-07-28 DIAGNOSIS — L821 Other seborrheic keratosis: Secondary | ICD-10-CM | POA: Diagnosis not present

## 2012-07-28 DIAGNOSIS — D2370 Other benign neoplasm of skin of unspecified lower limb, including hip: Secondary | ICD-10-CM | POA: Diagnosis not present

## 2012-07-28 DIAGNOSIS — H61009 Unspecified perichondritis of external ear, unspecified ear: Secondary | ICD-10-CM | POA: Diagnosis not present

## 2012-07-28 DIAGNOSIS — L570 Actinic keratosis: Secondary | ICD-10-CM | POA: Diagnosis not present

## 2012-09-12 DIAGNOSIS — R10814 Left lower quadrant abdominal tenderness: Secondary | ICD-10-CM | POA: Diagnosis not present

## 2012-09-19 DIAGNOSIS — E119 Type 2 diabetes mellitus without complications: Secondary | ICD-10-CM | POA: Diagnosis not present

## 2012-09-19 DIAGNOSIS — I10 Essential (primary) hypertension: Secondary | ICD-10-CM | POA: Diagnosis not present

## 2012-09-19 DIAGNOSIS — E7800 Pure hypercholesterolemia, unspecified: Secondary | ICD-10-CM | POA: Diagnosis not present

## 2012-09-19 DIAGNOSIS — E785 Hyperlipidemia, unspecified: Secondary | ICD-10-CM | POA: Diagnosis not present

## 2012-10-21 ENCOUNTER — Encounter: Payer: Self-pay | Admitting: Cardiology

## 2012-11-04 DIAGNOSIS — Z1212 Encounter for screening for malignant neoplasm of rectum: Secondary | ICD-10-CM | POA: Diagnosis not present

## 2012-11-04 DIAGNOSIS — R10814 Left lower quadrant abdominal tenderness: Secondary | ICD-10-CM | POA: Diagnosis not present

## 2012-11-10 ENCOUNTER — Inpatient Hospital Stay (HOSPITAL_COMMUNITY)
Admission: EM | Admit: 2012-11-10 | Discharge: 2012-11-16 | DRG: 392 | Disposition: A | Discharge status: Home / Self Care | Payer: Medicare Other | Attending: Internal Medicine | Admitting: Internal Medicine

## 2012-11-10 ENCOUNTER — Encounter (HOSPITAL_COMMUNITY): Payer: Self-pay | Admitting: *Deleted

## 2012-11-10 DIAGNOSIS — R001 Bradycardia, unspecified: Secondary | ICD-10-CM

## 2012-11-10 DIAGNOSIS — Z01818 Encounter for other preprocedural examination: Secondary | ICD-10-CM

## 2012-11-10 DIAGNOSIS — G458 Other transient cerebral ischemic attacks and related syndromes: Secondary | ICD-10-CM

## 2012-11-10 DIAGNOSIS — Z66 Do not resuscitate: Secondary | ICD-10-CM | POA: Diagnosis present

## 2012-11-10 DIAGNOSIS — F411 Generalized anxiety disorder: Secondary | ICD-10-CM | POA: Diagnosis not present

## 2012-11-10 DIAGNOSIS — E785 Hyperlipidemia, unspecified: Secondary | ICD-10-CM | POA: Diagnosis present

## 2012-11-10 DIAGNOSIS — Z8551 Personal history of malignant neoplasm of bladder: Secondary | ICD-10-CM | POA: Diagnosis not present

## 2012-11-10 DIAGNOSIS — Z87891 Personal history of nicotine dependence: Secondary | ICD-10-CM | POA: Diagnosis not present

## 2012-11-10 DIAGNOSIS — K651 Peritoneal abscess: Secondary | ICD-10-CM | POA: Diagnosis not present

## 2012-11-10 DIAGNOSIS — Z85828 Personal history of other malignant neoplasm of skin: Secondary | ICD-10-CM | POA: Diagnosis not present

## 2012-11-10 DIAGNOSIS — K5732 Diverticulitis of large intestine without perforation or abscess without bleeding: Principal | ICD-10-CM | POA: Diagnosis present

## 2012-11-10 DIAGNOSIS — I498 Other specified cardiac arrhythmias: Secondary | ICD-10-CM | POA: Diagnosis not present

## 2012-11-10 DIAGNOSIS — I2510 Atherosclerotic heart disease of native coronary artery without angina pectoris: Secondary | ICD-10-CM

## 2012-11-10 DIAGNOSIS — K5720 Diverticulitis of large intestine with perforation and abscess without bleeding: Secondary | ICD-10-CM | POA: Diagnosis present

## 2012-11-10 DIAGNOSIS — E119 Type 2 diabetes mellitus without complications: Secondary | ICD-10-CM | POA: Diagnosis not present

## 2012-11-10 DIAGNOSIS — Z79899 Other long term (current) drug therapy: Secondary | ICD-10-CM | POA: Diagnosis not present

## 2012-11-10 DIAGNOSIS — G4700 Insomnia, unspecified: Secondary | ICD-10-CM | POA: Diagnosis not present

## 2012-11-10 DIAGNOSIS — R10814 Left lower quadrant abdominal tenderness: Secondary | ICD-10-CM | POA: Diagnosis not present

## 2012-11-10 DIAGNOSIS — F17200 Nicotine dependence, unspecified, uncomplicated: Secondary | ICD-10-CM

## 2012-11-10 DIAGNOSIS — I10 Essential (primary) hypertension: Secondary | ICD-10-CM | POA: Diagnosis not present

## 2012-11-10 DIAGNOSIS — K630 Abscess of intestine: Secondary | ICD-10-CM | POA: Diagnosis present

## 2012-11-10 DIAGNOSIS — R109 Unspecified abdominal pain: Secondary | ICD-10-CM | POA: Diagnosis not present

## 2012-11-10 DIAGNOSIS — R933 Abnormal findings on diagnostic imaging of other parts of digestive tract: Secondary | ICD-10-CM | POA: Diagnosis not present

## 2012-11-10 HISTORY — DX: Diverticulitis of large intestine with perforation and abscess without bleeding: K57.20

## 2012-11-10 LAB — CBC WITH DIFFERENTIAL/PLATELET
Basophils Absolute: 0 10*3/uL (ref 0.0–0.1)
Eosinophils Absolute: 0 10*3/uL (ref 0.0–0.7)
Eosinophils Relative: 0 % (ref 0–5)
HCT: 35.3 % — ABNORMAL LOW (ref 39.0–52.0)
Lymphocytes Relative: 9 % — ABNORMAL LOW (ref 12–46)
MCH: 34.3 pg — ABNORMAL HIGH (ref 26.0–34.0)
MCHC: 36.5 g/dL — ABNORMAL HIGH (ref 30.0–36.0)
MCV: 93.9 fL (ref 78.0–100.0)
Monocytes Absolute: 1.5 10*3/uL — ABNORMAL HIGH (ref 0.1–1.0)
RDW: 12.8 % (ref 11.5–15.5)
WBC: 13.8 10*3/uL — ABNORMAL HIGH (ref 4.0–10.5)

## 2012-11-10 LAB — COMPREHENSIVE METABOLIC PANEL
AST: 27 U/L (ref 0–37)
CO2: 24 mEq/L (ref 19–32)
Calcium: 9 mg/dL (ref 8.4–10.5)
Creatinine, Ser: 0.74 mg/dL (ref 0.50–1.35)
GFR calc Af Amer: >90 mL/min (ref >90)
GFR calc non Af Amer: 90 mL/min — ABNORMAL LOW (ref >90)
Total Protein: 6.7 g/dL (ref 6.0–8.3)

## 2012-11-10 LAB — URINALYSIS, ROUTINE W REFLEX MICROSCOPIC
Glucose, UA: NEGATIVE mg/dL
Leukocytes, UA: NEGATIVE
pH: 6 (ref 5.0–8.0)

## 2012-11-10 MED ORDER — MORPHINE SULFATE 2 MG/ML IJ SOLN
2.0000 mg | INTRAMUSCULAR | Status: DC | PRN
  Administered 2012-11-10 – 2012-11-13 (×11): 2 mg via INTRAVENOUS
  Filled 2012-11-10 (×11): qty 1

## 2012-11-10 MED ORDER — MORPHINE SULFATE 4 MG/ML IJ SOLN
4.0000 mg | Freq: Once | INTRAMUSCULAR | Status: AC
  Administered 2012-11-10: 4 mg via INTRAVENOUS
  Filled 2012-11-10: qty 1

## 2012-11-10 MED ORDER — SODIUM CHLORIDE 0.9 % IV SOLN
INTRAVENOUS | Status: DC
  Administered 2012-11-10: 20:00:00 via INTRAVENOUS

## 2012-11-10 MED ORDER — METOPROLOL TARTRATE 50 MG PO TABS
50.0000 mg | ORAL_TABLET | Freq: Two times a day (BID) | ORAL | Status: DC
  Administered 2012-11-10 – 2012-11-14 (×8): 50 mg via ORAL
  Filled 2012-11-10 (×11): qty 1

## 2012-11-10 MED ORDER — ENOXAPARIN SODIUM 30 MG/0.3ML ~~LOC~~ SOLN
30.0000 mg | SUBCUTANEOUS | Status: DC
  Filled 2012-11-10 (×2): qty 0.3

## 2012-11-10 MED ORDER — ADULT MULTIVITAMIN W/MINERALS CH
1.0000 | ORAL_TABLET | Freq: Every day | ORAL | Status: DC
  Administered 2012-11-11 – 2012-11-16 (×6): 1 via ORAL
  Filled 2012-11-10 (×6): qty 1

## 2012-11-10 MED ORDER — INSULIN ASPART 100 UNIT/ML ~~LOC~~ SOLN
0.0000 [IU] | SUBCUTANEOUS | Status: DC
  Administered 2012-11-12: 2 [IU] via SUBCUTANEOUS
  Administered 2012-11-13: 3 [IU] via SUBCUTANEOUS
  Administered 2012-11-13 – 2012-11-14 (×3): 1 [IU] via SUBCUTANEOUS
  Administered 2012-11-14: 2 [IU] via SUBCUTANEOUS

## 2012-11-10 MED ORDER — ONDANSETRON HCL 4 MG/2ML IJ SOLN
4.0000 mg | Freq: Once | INTRAMUSCULAR | Status: AC
  Administered 2012-11-10: 4 mg via INTRAVENOUS
  Filled 2012-11-10: qty 2

## 2012-11-10 MED ORDER — SODIUM CHLORIDE 0.9 % IV SOLN
INTRAVENOUS | Status: DC
  Administered 2012-11-11 – 2012-11-13 (×2): via INTRAVENOUS

## 2012-11-10 MED ORDER — PANTOPRAZOLE SODIUM 40 MG IV SOLR
40.0000 mg | INTRAVENOUS | Status: DC
  Administered 2012-11-10 – 2012-11-11 (×2): 40 mg via INTRAVENOUS
  Filled 2012-11-10 (×3): qty 40

## 2012-11-10 MED ORDER — SIMVASTATIN 40 MG PO TABS
40.0000 mg | ORAL_TABLET | Freq: Every evening | ORAL | Status: DC
  Administered 2012-11-10 – 2012-11-13 (×4): 40 mg via ORAL
  Filled 2012-11-10 (×5): qty 1

## 2012-11-10 MED ORDER — ONDANSETRON HCL 4 MG/2ML IJ SOLN
4.0000 mg | Freq: Four times a day (QID) | INTRAMUSCULAR | Status: DC | PRN
  Administered 2012-11-11: 4 mg via INTRAVENOUS
  Filled 2012-11-10: qty 2

## 2012-11-10 MED ORDER — PIPERACILLIN-TAZOBACTAM 3.375 G IVPB
3.3750 g | Freq: Three times a day (TID) | INTRAVENOUS | Status: DC
  Administered 2012-11-11 – 2012-11-16 (×16): 3.375 g via INTRAVENOUS
  Filled 2012-11-10 (×20): qty 50

## 2012-11-10 MED ORDER — PIPERACILLIN-TAZOBACTAM 3.375 G IVPB 30 MIN
3.3750 g | Freq: Once | INTRAVENOUS | Status: AC
  Administered 2012-11-10: 3.375 g via INTRAVENOUS
  Filled 2012-11-10: qty 50

## 2012-11-10 NOTE — ED Notes (Signed)
Pt in stating he had an abdominal CT this am at [facility] and they were called back this afternoon and told the patient has an abscess at the end of his colon, pt c/o abd pain but denies vomiting, states he was told he would be seen by Dr. Randa Evens when he came in to the ED or another surgeon.

## 2012-11-10 NOTE — H&P (Addendum)
[name] History and Physical  TREYSHAWN MULDREW OZH:[id number] DOB: 1941-03-26 DOA: 11/10/2012  Referring physician: EDP PCP: Gabriel Cirri DO  Specialists: Cardiology [name]  Chief Complaint: Abdominal pain, abnormal CT abdomen  HPI: [name] is a 72 y.o. male with history of CAD, diabetes, history of subclavian steal syndrome, history of diverticulitis in June managed medically, presented to the ER today with the above complaints. Patient reports left lower quadrant pain for the last one week, he was started on ciprofloxacin by his PCP for presumed diverticulitis, despite antibiotics he continued to have worsening of left lower quadrant abdominal pain and finally had a CT abdomen pelvis today. CT abdomen pelvis showed 6.7 x 5 cm abscess, with rectosigmoid thickening, and he was sent to [facility] ER for further evaluation and management. His wife reports fever couple of days ago, also history of decreased appetite, poor by mouth intake and watery bowel movements in the past few days. He has never had a colonoscopy, report history a barium enema over 20 years ago by Dr. Randa Evens following an episode of diverticulitis. EDP, discussed with surgery who recommended non operative management.   Review of Systems: The patient denies anorexia, fever, weight loss,, vision loss, decreased hearing, hoarseness, chest pain, syncope, dyspnea on exertion, peripheral edema, balance deficits, hemoptysis, abdominal pain, melena, hematochezia, severe indigestion/heartburn, hematuria, incontinence, genital sores, muscle weakness, suspicious skin lesions, transient blindness, difficulty walking, depression, unusual weight change, abnormal bleeding, enlarged lymph nodes, angioedema, and breast masses.    Past Medical History  Diagnosis Date  . Diabetes mellitus   . Hyperlipidemia     Is on statin therapy  . Diverticulitis   . Hx of bladder cancer   . Normal nuclear stress test 2010    EF  63% with no ischemia  . Subclavian steal syndrome     left; evaluated by [name]; managed medically  . Skin cancer   . Bladder cancer   . Hypertension     LOV  Dr Shirlee Latch with clearance 05/08/11 and EKG  in EPIC   . CAD (coronary artery disease)     Mild per cath in 2002/last nuclear stress per note  2010   Past Surgical History  Procedure Laterality Date  . Cardiac catheterization  2002    He had a which showed mild coronary atherosclerosis and normal left ventricular function.  . Rotator cuff repair      right  . Tonsillectomy    . Appendectomy    . Cystoscopy with injection      cystoscopy with BCG injections  . Transurethral resection of prostate  05/17/2011    Procedure: TRANSURETHRAL RESECTION OF THE PROSTATE WITH GYRUS INSTRUMENTS;  Surgeon: Anner Crete, MD;  Location: [facility];  Service: Urology;  Laterality: N/A;   Social History:  reports that he quit smoking about 57 years ago. His smoking use included Pipe. He has quit using smokeless tobacco. He reports that he does not drink alcohol or use illicit drugs. Lives at home with wife  No Known Allergies  Family History  Problem Relation Age of Onset  . Hypertension Mother   . Lung cancer Mother     Prior to Admission medications   Medication Sig Start Date End Date Taking? Authorizing Provider  aspirin 81 MG tablet Take 81 mg by mouth daily.   Yes Historical Provider, MD  BIOTIN PO Take 1 tablet by mouth daily.    Yes Historical Provider, MD  Cyanocobalamin (VITAMIN B 12 PO)  Take by mouth as directed.   Yes Historical Provider, MD  dicyclomine (BENTYL) 20 MG tablet Take 20 mg by mouth 4 (four) times daily as needed (for abdominal pain).    Yes Historical Provider, MD  fish oil-omega-3 fatty acids 1000 MG capsule Take 1 g by mouth daily.    Yes Historical Provider, MD  glipiZIDE (GLUCOTROL) 5 MG tablet Take 2.5 mg by mouth daily before breakfast.    Yes Historical Provider, MD  metoprolol (LOPRESSOR) 50 MG tablet  Take 1 tablet (50 mg total) by mouth 2 (two) times daily. 11/23/11 11/22/12 Yes Laurey Morale, MD  Multiple Vitamin (MULTIVITAMIN) tablet Take 1 tablet by mouth daily.   Yes Historical Provider, MD  psyllium (METAMUCIL) 58.6 % powder Take 1 packet by mouth daily.   Yes Historical Provider, MD  simvastatin (ZOCOR) 40 MG tablet Take 1 tablet (40 mg total) by mouth every evening. 11/23/11  Yes Laurey Morale, MD   Physical Exam: Filed Vitals:   11/10/12 1715 11/10/12 1946  BP: 159/65 146/70  Pulse: 76   Temp: 99.1 F (37.3 C)   TempSrc: Oral   Resp: 17 20  SpO2: 96% 98%     General:  Alert awake oriented x3 in no distress  HEENT PERRLA, EOMI  CVS S1-S2 regular rate rhythm no murmurs rubs or gallops  Lungs clear auscultation bilaterally  Abdomen soft, mild left lower quadrant tenderness, no rigidity no rebound, bowel sounds diminished, no peritoneal signs  Extremities no edema clubbing or cyanosis  Skin no rashes or skin breakdown  Neuro moves her extremities no localizing signs   Labs on Admission:  Basic Metabolic Panel:  Recent Labs Lab 11/10/12 1723  NA 132*  K 3.5  CL 93*  CO2 24  GLUCOSE 120*  BUN 13  CREATININE 0.74  CALCIUM 9.0   Liver Function Tests:  Recent Labs Lab 11/10/12 1723  AST 27  ALT 37  ALKPHOS 79  BILITOT 0.4  PROT 6.7  ALBUMIN 3.2*   No results found for this basename: LIPASE, AMYLASE,  in the last 168 hours No results found for this basename: AMMONIA,  in the last 168 hours CBC:  Recent Labs Lab 11/10/12 1723  WBC 13.8*  NEUTROABS 11.0*  HGB 12.9*  HCT 35.3*  MCV 93.9  PLT 339   Cardiac Enzymes: No results found for this basename: CKTOTAL, CKMB, CKMBINDEX, TROPONINI,  in the last 168 hours  BNP (last 3 results) No results found for this basename: PROBNP,  in the last 8760 hours CBG: No results found for this basename: GLUCAP,  in the last 168 hours  Radiological Exams on Admission: No results  found.   Assessment/Plan Active Problems:   CAD (coronary artery disease)   HTN (hypertension)   Colonic diverticular abscess   Diabetes mellitus   1. Large Diverticular abscess -6.7 x 5cm based on CT at [facility] today -no peritoneal signs -CCS consult requested, I D/w Dr.Wilson, needs laproscopic vs Perc drain per IR -IV zosyn, IVF -supportive care, bowel rest, anti-emetics, pain control -will need a colonoscopy down the road when acute illness resolves  2. history of CAD -Stable, normal Myoview  in 2012 -Hold aspirin, continue metoprolol and statin  3. DM -Hold glipizide, sensitive sliding scale  4. Hypertension -Stable continue metoprolol  DVT prophylaxis with Lovenox  Code Status: DNR Family Communication: d/w wife and daughter at bedside Disposition Plan: inpatient  Time spent:  [place] [name] Pager [phone]  If 7PM-7AM, please  contact night-coverage [email or web address] Password [facility] 11/10/2012, 8:46 PM

## 2012-11-10 NOTE — Progress Notes (Signed)
ANTIBIOTIC CONSULT NOTE - INITIAL  Pharmacy Consult for zosyn Indication: diverticular abscess  No Known Allergies  Vital Signs: Temp: 99.1 F (37.3 C) (07/28 1715) Temp src: Oral (07/28 1715) BP: 146/70 mmHg (07/28 1946) Pulse Rate: 76 (07/28 1715) Intake/Output from previous day:   Intake/Output from this shift:    Labs:  Recent Labs  11/10/12 1723  WBC 13.8*  HGB 12.9*  PLT 339  CREATININE 0.74   The CrCl is unknown because both a height and weight (above a minimum accepted value) are required for this calculation. No results found for this basename: VANCOTROUGH, VANCOPEAK, VANCORANDOM, GENTTROUGH, GENTPEAK, GENTRANDOM, TOBRATROUGH, TOBRAPEAK, TOBRARND, AMIKACINPEAK, AMIKACINTROU, AMIKACIN,  in the last 72 hours   Microbiology: No results found for this or any previous visit (from the past 720 hour(s)).  Medical History: Past Medical History  Diagnosis Date  . Diabetes mellitus   . Hyperlipidemia     Is on statin therapy  . Diverticulitis   . Hx of bladder cancer   . Normal nuclear stress test 2010    EF 63% with no ischemia  . Subclavian steal syndrome     left; evaluated by [name]; managed medically  . Skin cancer   . Bladder cancer   . Hypertension     LOV  Dr Shirlee Latch with clearance 05/08/11 and EKG  in EPIC   . CAD (coronary artery disease)     Mild per cath in 2002/last nuclear stress per note  2010    Medications:  See med rec   Assessment: Patient is a 72 y.o M presented to the ED with c/o abdominal pain.  Patient was on Cipro PTA for presumed diverticulitis. Abdomen pelvis CT with noted abscess. Patient received zosyn 3.375gm IV in the ED at ~9 PM.  Plan:  1) zosyn 3.375gm IV q8h (infuse over 4 hours)  [name] 11/10/2012,9:16 PM

## 2012-11-11 ENCOUNTER — Inpatient Hospital Stay (HOSPITAL_COMMUNITY): Payer: Medicare Other

## 2012-11-11 ENCOUNTER — Encounter (HOSPITAL_COMMUNITY): Payer: Self-pay | Admitting: *Deleted

## 2012-11-11 DIAGNOSIS — K651 Peritoneal abscess: Secondary | ICD-10-CM | POA: Diagnosis not present

## 2012-11-11 DIAGNOSIS — K630 Abscess of intestine: Secondary | ICD-10-CM | POA: Diagnosis not present

## 2012-11-11 DIAGNOSIS — K5732 Diverticulitis of large intestine without perforation or abscess without bleeding: Secondary | ICD-10-CM | POA: Diagnosis not present

## 2012-11-11 DIAGNOSIS — E119 Type 2 diabetes mellitus without complications: Secondary | ICD-10-CM | POA: Diagnosis not present

## 2012-11-11 DIAGNOSIS — I10 Essential (primary) hypertension: Secondary | ICD-10-CM | POA: Diagnosis not present

## 2012-11-11 LAB — CBC
MCH: 32.7 pg (ref 26.0–34.0)
MCHC: 34.1 g/dL (ref 30.0–36.0)
Platelets: 317 10*3/uL (ref 150–400)
RDW: 12.9 % (ref 11.5–15.5)

## 2012-11-11 LAB — BASIC METABOLIC PANEL
Calcium: 8.9 mg/dL (ref 8.4–10.5)
GFR calc Af Amer: >90 mL/min (ref >90)
GFR calc non Af Amer: 85 mL/min — ABNORMAL LOW (ref >90)
Potassium: 3.7 mEq/L (ref 3.5–5.1)
Sodium: 132 mEq/L — ABNORMAL LOW (ref 135–145)

## 2012-11-11 LAB — GLUCOSE, CAPILLARY
Glucose-Capillary: 95 mg/dL (ref 70–99)
Glucose-Capillary: 90 mg/dL (ref 70–99)

## 2012-11-11 MED ORDER — ENOXAPARIN SODIUM 40 MG/0.4ML ~~LOC~~ SOLN
40.0000 mg | Freq: Every day | SUBCUTANEOUS | Status: DC
  Administered 2012-11-11 – 2012-11-15 (×5): 40 mg via SUBCUTANEOUS
  Filled 2012-11-11 (×6): qty 0.4

## 2012-11-11 MED ORDER — MIDAZOLAM HCL 2 MG/2ML IJ SOLN
INTRAMUSCULAR | Status: AC | PRN
  Administered 2012-11-11: 2 mg via INTRAVENOUS

## 2012-11-11 MED ORDER — FENTANYL CITRATE 0.05 MG/ML IJ SOLN
INTRAMUSCULAR | Status: AC | PRN
  Administered 2012-11-11 (×2): 25 ug via INTRAVENOUS

## 2012-11-11 MED ORDER — ENOXAPARIN SODIUM 30 MG/0.3ML ~~LOC~~ SOLN: 30.0000 mg | SUBCUTANEOUS | Status: DC

## 2012-11-11 NOTE — Progress Notes (Signed)
  Subjective: Pt states his pain has improved.  Having diarrhea.  Denies fever or chills.  Family at bedside.  Objective: Vital signs in last 24 hours: Temp:  [98.7 F (37.1 C)-99.7 F (37.6 C)] 99.7 F (37.6 C) (07/29 0522) Pulse Rate:  [60-76] 62 (07/29 0522) Resp:  [16-20] 16 (07/29 0522) BP: (118-159)/(58-70) 121/58 mmHg (07/29 0522) SpO2:  [96 %-98 %] 96 % (07/29 0522) Weight:  [159 lb (72.122 kg)] 159 lb (72.122 kg) (07/28 2156) Last BM Date: 11/10/12  Intake/Output from previous day: 07/28 0701 - 07/29 0700 In: 935 [I.V.:885; IV Piggyback:50] Out: -  Intake/Output this shift:    General appearance: alert, cooperative, appears stated age and no distress Resp: clear to auscultation bilaterally Cardio: regular rate and rhythm, S1, S2 normal, no murmur, click, rub or gallop GI: +BS, soft, flat and tender to LLQ and pelvic region.  No Extremities: extremities normal, atraumatic, no cyanosis or edema  Lab Results:   Recent Labs  11/10/12 1723 11/11/12 0522  WBC 13.8* 13.1*  HGB 12.9* 11.7*  HCT 35.3* 34.3*  PLT 339 317   BMET  Recent Labs  11/10/12 1723 11/11/12 0522  NA 132* 132*  K 3.5 3.7  CL 93* 95*  CO2 24 27  GLUCOSE 120* 90  BUN 13 11  CREATININE 0.74 0.86  CALCIUM 9.0 8.9    Anti-infectives: Anti-infectives   Start     Dose/Rate Route Frequency Ordered Stop   11/11/12 0300  piperacillin-tazobactam (ZOSYN) IVPB 3.375 g     3.375 g 12.5 mL/hr over 240 Minutes Intravenous Every 8 hours 11/10/12 2127     11/10/12 2100  piperacillin-tazobactam (ZOSYN) IVPB 3.375 g     3.375 g 100 mL/hr over 30 Minutes Intravenous  Once 11/10/12 2056 11/10/12 2146      Assessment/Plan: Sigmoid diverticulitis with abscess(second episode since June) -proceed with percutaneous drainage of abscess -continue NPO, IVF -pain control and antiemetics  -zosyn -repeat CBC in AM -pt has never had a colonoscopy, discussed the obtaining in non acute phase, 6 weeks to  rule out malignancy.  He does not wish to have one ever stating his sister died from the procedure.  VTE prophylaxis -Lovenox, hold until after IR drain placement. -SCDs, ambulate   LOS: 1 day    Bonner Puna [facility]  ANP-BC Pager [phone]   11/11/2012 11:36 AM

## 2012-11-11 NOTE — Progress Notes (Signed)
UR COMPLETED  

## 2012-11-11 NOTE — Progress Notes (Signed)
S/P drain placement. Abdominal pain much better, I spoke with him and his wife regarding the plan of care. Patient examined and I agree with the assessment and plan  Violeta Gelinas, MD, MPH, FACS Pager: [phone]  11/11/2012 4:26 PM .

## 2012-11-11 NOTE — Procedures (Signed)
Technically successful CT guided drainage catheter placement into left lower abdominal quadrant yielding 40 cc of purulent material.  Samples sent to laboratory.  No immediate post procedural complications.

## 2012-11-11 NOTE — Consult Note (Signed)
Reason for Consult:sigmoid diverticulitis Referring Physician: Dr [name] is an 72 y.o. male.  HPI: 72 year old Caucasian male admitted to the hospital with complicated sigmoid diverticulitis. The patient states about one week ago on Sunday he developed left lower quadrant and suprapubic abdominal pain. About a day later he contacted his primary care physician and was placed on twice a day ciprofloxacin. He states the abdominal pain persisted. It is actually been worsening. The pain is always there but it increases in intensity at times. It is worse with walking. He denies any nausea or vomiting. His last good bowel movement was on Saturday. Since that time he has had watery stools. He endorses some chills. He denies any pneumaturia. He denies any melena or hematochezia. He denies any family history of colorectal cancer. He has never had a colonoscopy. He states over the years he has had several episodes of diverticulitis. He had to be hospitalized about 20 years ago by Dr. Randa Evens for diverticulitis. He walks 2-1/2 miles generally every morning. He endorses flatus. He smokes one pipe per day.  Past Medical History  Diagnosis Date  . Diabetes mellitus   . Hyperlipidemia     Is on statin therapy  . Diverticulitis   . Hx of bladder cancer   . Normal nuclear stress test 2010    EF 63% with no ischemia  . Subclavian steal syndrome     left; evaluated by [name]; managed medically  . Skin cancer   . Bladder cancer   . Hypertension     LOV  Dr Shirlee Latch with clearance 05/08/11 and EKG  in EPIC   . CAD (coronary artery disease)     Mild per cath in 2002/last nuclear stress per note  2010    Past Surgical History  Procedure Laterality Date  . Cardiac catheterization  2002    He had a which showed mild coronary atherosclerosis and normal left ventricular function.  . Rotator cuff repair      right  . Tonsillectomy    . Appendectomy    . Cystoscopy with injection     cystoscopy with BCG injections  . Transurethral resection of prostate  05/17/2011    Procedure: TRANSURETHRAL RESECTION OF THE PROSTATE WITH GYRUS INSTRUMENTS;  Surgeon: Anner Crete, MD;  Location: [facility];  Service: Urology;  Laterality: N/A;    Family History  Problem Relation Age of Onset  . Hypertension Mother   . Lung cancer Mother     Social History:  reports that he quit smoking about 57 years ago. His smoking use included Pipe. He has quit using smokeless tobacco. He reports that he does not drink alcohol or use illicit drugs.  Allergies: No Known Allergies  Medications: I have reviewed the patient's current medications.  Results for orders placed during the hospital encounter of 11/10/12 (from the past 48 hour(s))  CBC WITH DIFFERENTIAL     Status: Abnormal   Collection Time    11/10/12  5:23 PM      Result Value Range   WBC 13.8 (*) 4.0 - 10.5 K/uL   RBC 3.76 (*) 4.22 - 5.81 MIL/uL   Hemoglobin 12.9 (*) 13.0 - 17.0 g/dL   HCT 96.0 (*) 45.4 - 09.8 %   MCV 93.9  78.0 - 100.0 fL   MCH 34.3 (*) 26.0 - 34.0 pg   MCHC 36.5 (*) 30.0 - 36.0 g/dL   RDW 11.9  14.7 - 82.9 %   Platelets 339  150 - 400 K/uL   Neutrophils Relative % 80 (*) 43 - 77 %   Neutro Abs 11.0 (*) 1.7 - 7.7 K/uL   Lymphocytes Relative 9 (*) 12 - 46 %   Lymphs Abs 1.3  0.7 - 4.0 K/uL   Monocytes Relative 11  3 - 12 %   Monocytes Absolute 1.5 (*) 0.1 - 1.0 K/uL   Eosinophils Relative 0  0 - 5 %   Eosinophils Absolute 0.0  0.0 - 0.7 K/uL   Basophils Relative 0  0 - 1 %   Basophils Absolute 0.0  0.0 - 0.1 K/uL  COMPREHENSIVE METABOLIC PANEL     Status: Abnormal   Collection Time    11/10/12  5:23 PM      Result Value Range   Sodium 132 (*) 135 - 145 mEq/L   Potassium 3.5  3.5 - 5.1 mEq/L   Chloride 93 (*) 96 - 112 mEq/L   CO2 24  19 - 32 mEq/L   Glucose, Bld 120 (*) 70 - 99 mg/dL   BUN 13  6 - 23 mg/dL   Creatinine, Ser 1.61  0.50 - 1.35 mg/dL   Calcium 9.0  8.4 - 09.6 mg/dL   Total Protein 6.7  6.0  - 8.3 g/dL   Albumin 3.2 (*) 3.5 - 5.2 g/dL   AST 27  0 - 37 U/L   ALT 37  0 - 53 U/L   Alkaline Phosphatase 79  39 - 117 U/L   Total Bilirubin 0.4  0.3 - 1.2 mg/dL   GFR calc non Af Amer 90 (*) >90 mL/min   GFR calc Af Amer >90  >90 mL/min   Comment:            The eGFR has been calculated     using the CKD EPI equation.     This calculation has not been     validated in all clinical     situations.     eGFR's persistently     <90 mL/min signify     possible Chronic Kidney Disease.  URINALYSIS, ROUTINE W REFLEX MICROSCOPIC     Status: Abnormal   Collection Time    11/10/12  5:34 PM      Result Value Range   Color, Urine YELLOW  YELLOW   APPearance CLEAR  CLEAR   Specific Gravity, Urine 1.010  1.005 - 1.030   pH 6.0  5.0 - 8.0   Glucose, UA NEGATIVE  NEGATIVE mg/dL   Hgb urine dipstick NEGATIVE  NEGATIVE   Bilirubin Urine NEGATIVE  NEGATIVE   Ketones, ur 40 (*) NEGATIVE mg/dL   Protein, ur NEGATIVE  NEGATIVE mg/dL   Urobilinogen, UA 0.2  0.0 - 1.0 mg/dL   Nitrite NEGATIVE  NEGATIVE   Leukocytes, UA NEGATIVE  NEGATIVE   Comment: MICROSCOPIC NOT DONE ON URINES WITH NEGATIVE PROTEIN, BLOOD, LEUKOCYTES, NITRITE, OR GLUCOSE <1000 mg/dL.  GLUCOSE, CAPILLARY     Status: None   Collection Time    11/10/12 11:35 PM      Result Value Range   Glucose-Capillary 99  70 - 99 mg/dL   RADIOLOGICAL STUDIES: I have personally reviewed the radiological exams myself  No results found.  Review of Systems  Constitutional: Positive for chills. Negative for fever and weight loss.  HENT: Negative for nosebleeds.   Eyes: Negative for blurred vision.  Respiratory: Negative for shortness of breath.   Cardiovascular: Negative for chest pain, palpitations, orthopnea, leg swelling and PND.  Denies DOE  Genitourinary: Positive for frequency. Negative for dysuria and hematuria.  Musculoskeletal: Negative.   Skin: Negative for itching and rash.  Neurological: Negative for dizziness, focal  weakness, seizures, loss of consciousness and headaches.       Denies TIAs, amaurosis fugax  Endo/Heme/Allergies: Does not bruise/bleed easily.  Psychiatric/Behavioral: The patient is not nervous/anxious.    Blood pressure 118/67, pulse 60, temperature 98.7 F (37.1 C), temperature source Oral, resp. rate 16, height 6' (1.829 m), weight 159 lb (72.122 kg), SpO2 98.00%. Physical Exam  Vitals reviewed. Constitutional: He is oriented to person, place, and time. He appears well-developed and well-nourished. No distress.  HENT:  Head: Normocephalic and atraumatic.  Right Ear: External ear normal.  Left Ear: External ear normal.  Eyes: Conjunctivae are normal. No scleral icterus.  Neck: Normal range of motion. Neck supple. No tracheal deviation present. No thyromegaly present.  Cardiovascular: Normal rate.   Murmur (?SEM 1/VI) heard. Respiratory: Effort normal and breath sounds normal. No stridor. No respiratory distress. He has no wheezes.  GI: Soft. He exhibits no distension. There is tenderness in the suprapubic area and left lower quadrant. There is no rigidity, no rebound and no guarding.    Musculoskeletal: He exhibits no edema and no tenderness.  Lymphadenopathy:    He has no cervical adenopathy.  Neurological: He is alert and oriented to person, place, and time. He exhibits normal muscle tone.  Skin: Skin is warm and dry. No rash noted. He is not diaphoretic. No erythema. No pallor.  Psychiatric: He has a normal mood and affect. His behavior is normal. Judgment and thought content normal.    Assessment/Plan: Complicated sigmoid diverticulitis With multiloculated pericolonic abscess Hypertension Coronary artery disease Diabetes mellitus   I reviewed a CT [facility] done at [facility] With Dr. Fredia Sorrow. He has essentially pandiverticulosis however he has sigmoid diverticulitis that is complicated with a pericolonic abscess that is multiloculated. It is causing some reactive bladder wall  thickening on the dome of the bladder. There does appear to be window for percutaneous drain; however, it may not be successful in draining the entire abscess since it is loculated. Currently the patient does not have peritonitis, he is afebrile, he is not tachycardic, he is nontoxic appearing. Do not see a role for laparotomy this evening. I discussed diverticula disease as well as diverticulitis. We discussed management of diverticulitis including but operative and nonoperative management. The ideal goal would be to get the patient through this hospitalization with nonoperative management; however, I did explain to the patient that if he clinically worsened he may require laparotomy  For right now: N.p.o., bowel rest IV fluids IV antibiotics Daily labs Percutaneous drain by IR of pericolonic abscess - We'll discuss with the surgical day team to make sure Dr. Janee Morn is in agreement with this plan since he is our surgical hospitalist this week  Mary Sella. Andrey Campanile, MD, FACS General, Bariatric, & Minimally Invasive Surgery [facility], [place]   [facility] M 11/11/2012, 12:04 AM

## 2012-11-11 NOTE — Progress Notes (Signed)
[name] PROGRESS NOTE  [name] ZOX:[id number] DOB: [birth year] DOA: 11/10/2012 PCP: Gabriel Cirri, DO  Assessment/Plan: 1. Large Diverticular abscess  -6.7 x 5cm based on CT at [facility]  -no peritoneal signs  -CCS consulted, plan for Perc drain per IR -IV zosyn, IVF  -supportive care, bowel rest, anti-emetics, pain control  -will need a colonoscopy down the road when acute illness resolves -Per surgery notes, pt has declined colonoscopy 2. history of CAD  -Stable, normal Myoview in 2012  -Hold aspirin, continue metoprolol and statin  3. DM  -Hold glipizide, sensitive sliding scale  4. Hypertension  -Stable continue metoprolol  DVT prophylaxis -Lovenox  Code Status: DNR Family Communication: Pt in room (indicate person spoken with, relationship, and if by phone, the number) Disposition Plan: Pending   Consultants:  Surgery  IR  Antibiotics:  Zosyn 11/10/12>>>  HPI/Subjective: No acute events overnight. Pt reports feeling better s/p drain placement  Objective: Filed Vitals:   11/10/12 1946 11/10/12 2156 11/11/12 0522 11/11/12 1314  BP: 146/70 118/67 121/58 145/58  Pulse:  60 62 56  Temp:  98.7 F (37.1 C) 99.7 F (37.6 C) 99.1 F (37.3 C)  TempSrc:  Oral Oral Oral  Resp: 20 16 16 17   Height:  6' (1.829 m)    Weight:  72.122 kg (159 lb)    SpO2: 98% 98% 96% 97%    Intake/Output Summary (Last 24 hours) at 11/11/12 1412 Last data filed at 11/11/12 0500  Gross per 24 hour  Intake    935 ml  Output      0 ml  Net    935 ml   Filed Weights   11/10/12 2156  Weight: 72.122 kg (159 lb)    Exam:   General:  Awake, in nad  Cardiovascular: regular, s1, s2  Respiratory: normal resp effort, no wheezing  Abdomen: soft, nondistended, drain in place  Musculoskeletal: perfused, no clubbing or cyanosis   Data Reviewed: Basic Metabolic Panel:  Recent Labs Lab 11/10/12 1723 11/11/12 0522  NA 132* 132*  K 3.5 3.7  CL 93* 95*   CO2 24 27  GLUCOSE 120* 90  BUN 13 11  CREATININE 0.74 0.86  CALCIUM 9.0 8.9   Liver Function Tests:  Recent Labs Lab 11/10/12 1723  AST 27  ALT 37  ALKPHOS 79  BILITOT 0.4  PROT 6.7  ALBUMIN 3.2*   No results found for this basename: LIPASE, AMYLASE,  in the last 168 hours No results found for this basename: AMMONIA,  in the last 168 hours CBC:  Recent Labs Lab 11/10/12 1723 11/11/12 0522  WBC 13.8* 13.1*  NEUTROABS 11.0*  --   HGB 12.9* 11.7*  HCT 35.3* 34.3*  MCV 93.9 95.8  PLT 339 317   Cardiac Enzymes: No results found for this basename: CKTOTAL, CKMB, CKMBINDEX, TROPONINI,  in the last 168 hours BNP (last 3 results) No results found for this basename: PROBNP,  in the last 8760 hours CBG:  Recent Labs Lab 11/10/12 2335 11/11/12 0359 11/11/12 0736 11/11/12 1144  GLUCAP 99 95 90 86    No results found for this or any previous visit (from the past 240 hour(s)).   Studies: No results found.  Scheduled Meds: . enoxaparin (LOVENOX) injection  40 mg Subcutaneous QHS  . insulin aspart  0-9 Units Subcutaneous Q4H  . metoprolol  50 mg Oral BID  . multivitamin with minerals  1 tablet Oral Daily  . pantoprazole (PROTONIX) IV  40 mg Intravenous  Q24H  . piperacillin-tazobactam (ZOSYN)  IV  3.375 g Intravenous Q8H  . simvastatin  40 mg Oral QPM   Continuous Infusions: . sodium chloride 100 mL/hr at 11/11/12 1100    Active Problems:   CAD (coronary artery disease)   HTN (hypertension)   Colonic diverticular abscess   Diabetes mellitus    Time spent:    Lura Falor K  [name] Pager [phone]. If 7PM-7AM, please contact night-coverage at [email or web address], password [facility] 11/11/2012, 2:12 PM  LOS: 1 day

## 2012-11-11 NOTE — H&P (Signed)
[name] is an 72 y.o. male.   Chief Complaint: Hx several days of LLQ abd pain Watery stools x 2 days Hx diverticulitis Low grad temp; high wbc CT from [facility] reveals LLQ sigmoid abscess [facility] has recommended abscess drain placement Scheduled now for LLQ abscess drain placement HPI: HLD; DM; diverticulitis; Bladder ca; HTN; CAD  Past Medical History  Diagnosis Date  . Diabetes mellitus   . Hyperlipidemia     Is on statin therapy  . Diverticulitis   . Hx of bladder cancer   . Normal nuclear stress test 2010    EF 63% with no ischemia  . Subclavian steal syndrome     left; evaluated by [name]; managed medically  . Skin cancer   . Bladder cancer   . Hypertension     LOV  Dr Shirlee Latch with clearance 05/08/11 and EKG  in [facility]   . CAD (coronary artery disease)     Mild per cath in 2002/last nuclear stress per note  2010    Past Surgical History  Procedure Laterality Date  . Cardiac catheterization  2002    He had a which showed mild coronary atherosclerosis and normal left ventricular function.  . Rotator cuff repair      right  . Tonsillectomy    . Appendectomy    . Cystoscopy with injection      cystoscopy with BCG injections  . Transurethral resection of prostate  05/17/2011    Procedure: TRANSURETHRAL RESECTION OF THE PROSTATE WITH GYRUS INSTRUMENTS;  Surgeon: Anner Crete, MD;  Location: [facility];  Service: Urology;  Laterality: N/A;    Family History  Problem Relation Age of Onset  . Hypertension Mother   . Lung cancer Mother    Social History:  reports that he quit smoking about 57 years ago. His smoking use included Pipe. He has quit using smokeless tobacco. He reports that he does not drink alcohol or use illicit drugs.  Allergies: No Known Allergies  Medications Prior to Admission  Medication Sig Dispense Refill  . aspirin 81 MG tablet Take 81 mg by mouth daily.      Marland Kitchen BIOTIN PO Take 1 tablet by mouth daily.       . Cyanocobalamin (VITAMIN B 12  PO) Take by mouth as directed.      . dicyclomine (BENTYL) 20 MG tablet Take 20 mg by mouth 4 (four) times daily as needed (for abdominal pain).       . fish oil-omega-3 fatty acids 1000 MG capsule Take 1 g by mouth daily.       Marland Kitchen glipiZIDE (GLUCOTROL) 5 MG tablet Take 2.5 mg by mouth daily before breakfast.       . metoprolol (LOPRESSOR) 50 MG tablet Take 1 tablet (50 mg total) by mouth 2 (two) times daily.  30 tablet  3  . Multiple Vitamin (MULTIVITAMIN) tablet Take 1 tablet by mouth daily.      . psyllium (METAMUCIL) 58.6 % powder Take 1 packet by mouth daily.      . simvastatin (ZOCOR) 40 MG tablet Take 1 tablet (40 mg total) by mouth every evening.  30 tablet  3    Results for orders placed during the hospital encounter of 11/10/12 (from the past 48 hour(s))  CBC WITH DIFFERENTIAL     Status: Abnormal   Collection Time    11/10/12  5:23 PM      Result Value Range   WBC 13.8 (*) 4.0 -  10.5 K/uL   RBC 3.76 (*) 4.22 - 5.81 MIL/uL   Hemoglobin 12.9 (*) 13.0 - 17.0 g/dL   HCT 65.7 (*) 84.6 - 96.2 %   MCV 93.9  78.0 - 100.0 fL   MCH 34.3 (*) 26.0 - 34.0 pg   MCHC 36.5 (*) 30.0 - 36.0 g/dL   RDW 95.2  84.1 - 32.4 %   Platelets 339  150 - 400 K/uL   Neutrophils Relative % 80 (*) 43 - 77 %   Neutro Abs 11.0 (*) 1.7 - 7.7 K/uL   Lymphocytes Relative 9 (*) 12 - 46 %   Lymphs Abs 1.3  0.7 - 4.0 K/uL   Monocytes Relative 11  3 - 12 %   Monocytes Absolute 1.5 (*) 0.1 - 1.0 K/uL   Eosinophils Relative 0  0 - 5 %   Eosinophils Absolute 0.0  0.0 - 0.7 K/uL   Basophils Relative 0  0 - 1 %   Basophils Absolute 0.0  0.0 - 0.1 K/uL  COMPREHENSIVE METABOLIC PANEL     Status: Abnormal   Collection Time    11/10/12  5:23 PM      Result Value Range   Sodium 132 (*) 135 - 145 mEq/L   Potassium 3.5  3.5 - 5.1 mEq/L   Chloride 93 (*) 96 - 112 mEq/L   CO2 24  19 - 32 mEq/L   Glucose, Bld 120 (*) 70 - 99 mg/dL   BUN 13  6 - 23 mg/dL   Creatinine, Ser 4.01  0.50 - 1.35 mg/dL   Calcium 9.0  8.4 -  02.7 mg/dL   Total Protein 6.7  6.0 - 8.3 g/dL   Albumin 3.2 (*) 3.5 - 5.2 g/dL   AST 27  0 - 37 U/L   ALT 37  0 - 53 U/L   Alkaline Phosphatase 79  39 - 117 U/L   Total Bilirubin 0.4  0.3 - 1.2 mg/dL   GFR calc non Af Amer 90 (*) >90 mL/min   GFR calc Af Amer >90  >90 mL/min   Comment:            The eGFR has been calculated     using the CKD EPI equation.     This calculation has not been     validated in all clinical     situations.     eGFR's persistently     <90 mL/min signify     possible Chronic Kidney Disease.  URINALYSIS, ROUTINE W REFLEX MICROSCOPIC     Status: Abnormal   Collection Time    11/10/12  5:34 PM      Result Value Range   Color, Urine YELLOW  YELLOW   APPearance CLEAR  CLEAR   Specific Gravity, Urine 1.010  1.005 - 1.030   pH 6.0  5.0 - 8.0   Glucose, UA NEGATIVE  NEGATIVE mg/dL   Hgb urine dipstick NEGATIVE  NEGATIVE   Bilirubin Urine NEGATIVE  NEGATIVE   Ketones, ur 40 (*) NEGATIVE mg/dL   Protein, ur NEGATIVE  NEGATIVE mg/dL   Urobilinogen, UA 0.2  0.0 - 1.0 mg/dL   Nitrite NEGATIVE  NEGATIVE   Leukocytes, UA NEGATIVE  NEGATIVE   Comment: MICROSCOPIC NOT DONE ON URINES WITH NEGATIVE PROTEIN, BLOOD, LEUKOCYTES, NITRITE, OR GLUCOSE <1000 mg/dL.  GLUCOSE, CAPILLARY     Status: None   Collection Time    11/10/12 11:35 PM      Result Value Range   Glucose-Capillary  99  70 - 99 mg/dL  GLUCOSE, CAPILLARY     Status: None   Collection Time    11/11/12  3:59 AM      Result Value Range   Glucose-Capillary 95  70 - 99 mg/dL  CBC     Status: Abnormal   Collection Time    11/11/12  5:22 AM      Result Value Range   WBC 13.1 (*) 4.0 - 10.5 K/uL   RBC 3.58 (*) 4.22 - 5.81 MIL/uL   Hemoglobin 11.7 (*) 13.0 - 17.0 g/dL   HCT 40.9 (*) 81.1 - 91.4 %   MCV 95.8  78.0 - 100.0 fL   MCH 32.7  26.0 - 34.0 pg   MCHC 34.1  30.0 - 36.0 g/dL   RDW 78.2  95.6 - 21.3 %   Platelets 317  150 - 400 K/uL  BASIC METABOLIC PANEL     Status: Abnormal   Collection Time     11/11/12  5:22 AM      Result Value Range   Sodium 132 (*) 135 - 145 mEq/L   Potassium 3.7  3.5 - 5.1 mEq/L   Chloride 95 (*) 96 - 112 mEq/L   CO2 27  19 - 32 mEq/L   Glucose, Bld 90  70 - 99 mg/dL   BUN 11  6 - 23 mg/dL   Creatinine, Ser 0.86  0.50 - 1.35 mg/dL   Calcium 8.9  8.4 - 57.8 mg/dL   GFR calc non Af Amer 85 (*) >90 mL/min   GFR calc Af Amer >90  >90 mL/min   Comment:            The eGFR has been calculated     using the CKD EPI equation.     This calculation has not been     validated in all clinical     situations.     eGFR's persistently     <90 mL/min signify     possible Chronic Kidney Disease.  GLUCOSE, CAPILLARY     Status: None   Collection Time    11/11/12  7:36 AM      Result Value Range   Glucose-Capillary 90  70 - 99 mg/dL  GLUCOSE, CAPILLARY     Status: None   Collection Time    11/11/12 11:44 AM      Result Value Range   Glucose-Capillary 86  70 - 99 mg/dL   No results found.  Review of Systems  Constitutional: Positive for fever. Negative for weight loss.  Respiratory: Negative for shortness of breath.   Cardiovascular: Negative for chest pain.  Gastrointestinal: Positive for nausea, vomiting and abdominal pain.  Neurological: Positive for weakness. Negative for headaches.    Blood pressure 121/58, pulse 62, temperature 99.7 F (37.6 C), temperature source Oral, resp. rate 16, height 6' (1.829 m), weight 159 lb (72.122 kg), SpO2 96.00%. Physical Exam  Constitutional: He is oriented to person, place, and time. He appears well-developed.  Cardiovascular: Normal rate.   No murmur heard. Respiratory: Effort normal and breath sounds normal.  GI: Soft. Bowel sounds are normal. There is tenderness.  Musculoskeletal: Normal range of motion.  Neurological: He is alert and oriented to person, place, and time.  Skin: Skin is warm.  Psychiatric: He has a normal mood and affect. His behavior is normal. Judgment and thought content normal.      Assessment/Plan LLQ abd pain x several days- watery diarrhea Low grad e fever; high  wbc CT shows sigmoid divertic abscess Scheduled now for abscess drain placement Pt and family aware of procedure benefits and risks and agreeable to proceed Consent signed and in chart  Kirat Mezquita A 11/11/2012, 12:39 PM

## 2012-11-11 NOTE — Progress Notes (Signed)
INITIAL NUTRITION ASSESSMENT  DOCUMENTATION CODES Per approved criteria  -Not Applicable   INTERVENTION: Advance diet as medically appropriate  RD to follow for nutrition care plan, add interventions accordingly  NUTRITION DIAGNOSIS: Inadequate oral intake related to inability to eat as evidenced by NPO status  Goal: Patient to meet >/= 90% of their estimated nutrition needs   Monitor:  PO diet advancement & intake, weight, labs, I/O's  Reason for Assessment: Malnutrition Screening Tool Report  72 y.o. male  Admitting Dx: abdominal pain  ASSESSMENT: Patient with history of CAD, DM, subclavian steal syndrome and diverticulitis; presented to ER with LLQ abdominal pain x 1 week ---> CT of abdomen/pelvis showed abscess with rectosigmoid thickening.   Patient reports his appetite had been decreased for a few days PTA; per weight readings, his weight has been stable; currently NPO for abscess drain placement; patient has never tried Ensure supplements, however, he seemed open to trying as needed ---> RD to monitor.  Height: Ht Readings from Last 1 Encounters:  11/10/12 6' (1.829 m)    Weight: Wt Readings from Last 1 Encounters:  11/10/12 159 lb (72.122 kg)    Ideal Body Weight: 178 lb  % Ideal Body Weight: 89%  Wt Readings from Last 10 Encounters:  11/10/12 159 lb (72.122 kg)  11/23/11 161 lb 1.9 oz (73.084 kg)  05/17/11 160 lb 7.9 oz (72.8 kg)  05/17/11 160 lb 7.9 oz (72.8 kg)  05/14/11 157 lb (71.215 kg)  05/08/11 161 lb (73.029 kg)  11/27/10 159 lb 9.6 oz (72.394 kg)    Usual Body Weight: 161 lb  % Usual Body Weight: 98%  BMI:  Body mass index is 21.56 kg/(m^2).  Estimated Nutritional Needs: Kcal: 1800-2000 Protein: 90-100 gm Fluid: 1.8-2.0 L  Skin: Intact  Diet Order: NPO  EDUCATION NEEDS: -No education needs identified at this time   Intake/Output Summary (Last 24 hours) at 11/11/12 1441 Last data filed at 11/11/12 0500  Gross per 24 hour   Intake    935 ml  Output      0 ml  Net    935 ml    Labs:   Recent Labs Lab 11/10/12 1723 11/11/12 0522  NA 132* 132*  K 3.5 3.7  CL 93* 95*  CO2 24 27  BUN 13 11  CREATININE 0.74 0.86  CALCIUM 9.0 8.9  GLUCOSE 120* 90    CBG (last 3)   Recent Labs  11/11/12 0359 11/11/12 0736 11/11/12 1144  GLUCAP 95 90 86    Scheduled Meds: . enoxaparin (LOVENOX) injection  40 mg Subcutaneous QHS  . insulin aspart  0-9 Units Subcutaneous Q4H  . metoprolol  50 mg Oral BID  . multivitamin with minerals  1 tablet Oral Daily  . pantoprazole (PROTONIX) IV  40 mg Intravenous Q24H  . piperacillin-tazobactam (ZOSYN)  IV  3.375 g Intravenous Q8H  . simvastatin  40 mg Oral QPM    Continuous Infusions: . sodium chloride 100 mL/hr at 11/11/12 1100    Past Medical History  Diagnosis Date  . Diabetes mellitus   . Hyperlipidemia     Is on statin therapy  . Diverticulitis   . Hx of bladder cancer   . Normal nuclear stress test 2010    EF 63% with no ischemia  . Subclavian steal syndrome     left; evaluated by [name]; managed medically  . Skin cancer   . Bladder cancer   . Hypertension     LOV  Dr  McLean with clearance 05/08/11 and EKG  in [facility]   . CAD (coronary artery disease)     Mild per cath in 2002/last nuclear stress per note  2010    Past Surgical History  Procedure Laterality Date  . Cardiac catheterization  2002    He had a which showed mild coronary atherosclerosis and normal left ventricular function.  . Rotator cuff repair      right  . Tonsillectomy    . Appendectomy    . Cystoscopy with injection      cystoscopy with BCG injections  . Transurethral resection of prostate  05/17/2011    Procedure: TRANSURETHRAL RESECTION OF THE PROSTATE WITH GYRUS INSTRUMENTS;  Surgeon: Anner Crete, MD;  Location: [facility];  Service: Urology;  Laterality: N/A;    Maureen Chatters, RD, LDN Pager #: [phone] After-Hours Pager #: [phone]

## 2012-11-12 DIAGNOSIS — K5732 Diverticulitis of large intestine without perforation or abscess without bleeding: Secondary | ICD-10-CM | POA: Diagnosis not present

## 2012-11-12 DIAGNOSIS — K630 Abscess of intestine: Secondary | ICD-10-CM | POA: Diagnosis not present

## 2012-11-12 LAB — CBC
HCT: 32.1 % — ABNORMAL LOW (ref 39.0–52.0)
Hemoglobin: 11.1 g/dL — ABNORMAL LOW (ref 13.0–17.0)
MCH: 33.1 pg (ref 26.0–34.0)
MCV: 95.8 fL (ref 78.0–100.0)
RBC: 3.35 MIL/uL — ABNORMAL LOW (ref 4.22–5.81)

## 2012-11-12 LAB — BASIC METABOLIC PANEL
BUN: 13 mg/dL (ref 6–23)
CO2: 30 mEq/L (ref 19–32)
Calcium: 8.5 mg/dL (ref 8.4–10.5)
Creatinine, Ser: 0.97 mg/dL (ref 0.50–1.35)
Glucose, Bld: 96 mg/dL (ref 70–99)

## 2012-11-12 LAB — GLUCOSE, CAPILLARY
Glucose-Capillary: 94 mg/dL (ref 70–99)
Glucose-Capillary: 190 mg/dL — ABNORMAL HIGH (ref 70–99)

## 2012-11-12 MED ORDER — ASPIRIN 81 MG PO CHEW
81.0000 mg | CHEWABLE_TABLET | Freq: Every day | ORAL | Status: DC
  Administered 2012-11-13 – 2012-11-16 (×4): 81 mg via ORAL
  Filled 2012-11-12 (×4): qty 1

## 2012-11-12 MED ORDER — PANTOPRAZOLE SODIUM 40 MG PO TBEC
40.0000 mg | DELAYED_RELEASE_TABLET | Freq: Every day | ORAL | Status: DC
  Administered 2012-11-12 – 2012-11-16 (×5): 40 mg via ORAL
  Filled 2012-11-12 (×6): qty 1

## 2012-11-12 NOTE — Progress Notes (Signed)
  Subjective: States pain initially improved following placement of drain, though now has some pain. Has been taking full liquid diet. No BM. Is passing small amount of gas. States wants to get out of here.  Objective: Vital signs in last 24 hours: Temp:  [98.2 F (36.8 C)-99.1 F (37.3 C)] 98.2 F (36.8 C) (07/30 0507) Pulse Rate:  [53-68] 60 (07/30 0507) Resp:  [12-26] 17 (07/30 0507) BP: (108-145)/(53-62) 108/55 mmHg (07/30 0507) SpO2:  [91 %-99 %] 96 % (07/30 0507) Last BM Date: 11/10/12  Intake/Output from previous day: 07/29 0701 - 07/30 0700 In: -  Out: 50 [Drains:50] Intake/Output this shift:    General appearance: NAD, resting comfortably in bed GI: soft, flat and tender to LLQ and pelvic region, guarding present, drain in place    Lab Results:   Recent Labs  11/11/12 0522 11/12/12 0445  WBC 13.1* 9.7  HGB 11.7* 11.1*  HCT 34.3* 32.1*  PLT 317 310   BMET  Recent Labs  11/11/12 0522 11/12/12 0445  NA 132* 134*  K 3.7 3.9  CL 95* 96  CO2 27 30  GLUCOSE 90 96  BUN 11 13  CREATININE 0.86 0.97  CALCIUM 8.9 8.5    Anti-infectives: Anti-infectives   Start     Dose/Rate Route Frequency Ordered Stop   11/11/12 0300  piperacillin-tazobactam (ZOSYN) IVPB 3.375 g     3.375 g 12.5 mL/hr over 240 Minutes Intravenous Every 8 hours 11/10/12 2127     11/10/12 2100  piperacillin-tazobactam (ZOSYN) IVPB 3.375 g     3.375 g 100 mL/hr over 30 Minutes Intravenous  Once 11/10/12 2056 11/10/12 2146      Assessment/Plan: Sigmoid diverticulitis with abscess(second episode since June) -s/p percutaneous drain placement -given abdominal pain will stay with full liquid diet  -continue pain control and antiemetics -zosyn -continue to follow CBC   LOS: 2 days    Marikay Alar   11/12/2012 8:20 AM

## 2012-11-12 NOTE — Progress Notes (Signed)
Subjective: LLQ drain placed 7/29 Pt feels better Drain site is sore Pt emptied bag himself without measuring output last night  Objective: Vital signs in last 24 hours: Temp:  [98.2 F (36.8 C)-99.1 F (37.3 C)] 98.2 F (36.8 C) (07/30 0507) Pulse Rate:  [53-68] 60 (07/30 0507) Resp:  [12-26] 17 (07/30 0507) BP: (108-145)/(53-62) 108/55 mmHg (07/30 0507) SpO2:  [91 %-99 %] 96 % (07/30 0507) Last BM Date: 11/10/12  Intake/Output from previous day: 07/29 0701 - 07/30 0700 In: -  Out: 50 [Drains:50] Intake/Output this shift:    PE:  VSS; afeb Site clean and dry Output 50 cc yesterday (prob more like 100 cc- pt emptied bag himself) 50 cc in bag now- bloody pus material Tender at site Wbc: 9.7 (13.1) Cx pending  Lab Results:   Recent Labs  11/11/12 0522 11/12/12 0445  WBC 13.1* 9.7  HGB 11.7* 11.1*  HCT 34.3* 32.1*  PLT 317 310   BMET  Recent Labs  11/11/12 0522 11/12/12 0445  NA 132* 134*  K 3.7 3.9  CL 95* 96  CO2 27 30  GLUCOSE 90 96  BUN 11 13  CREATININE 0.86 0.97  CALCIUM 8.9 8.5   PT/INR  Recent Labs  11/11/12 1228  LABPROT 14.5  INR 1.15   ABG No results found for this basename: PHART, PCO2, PO2, HCO3,  in the last 72 hours  Studies/Results: Ct Image Guided Fluid Drain By Catheter  11/11/2012   *RADIOLOGY REPORT*  Indication: Perforated diverticulitis, now with abscess within the left lower abdomen.  CT GUIDED LEFT LOWER ABDOMINAL DRAINAGE CATHETER PLACEMENT  Comparison: CT abdomen pelvis - 11/10/2012  Medications: Fentanyl 50 mcg IV; Versed 2 mg IV  Total Moderate Sedation time: 20 minutes  Contrast: None  Complications: None immediate  Technique / Findings:  Informed written consent was obtained from the patient after a discussion of the risks, benefits and alternatives to treatment. The patient was placed supine on the CT gantry and a pre procedural CT was performed re-demonstrating the known abscess/fluid collection within the left  lower abdominal quadrant.  The procedure was planned.   A timeout was performed prior to the initiation of the procedure.  The left lower abdomen was prepped and draped in the usual sterile fashion.   The overlying soft tissues were anesthetized with 1% lidocaine with epinephrine.  Appropriate trajectory was planned with the use of a 22 gauge spinal needle.  An 18 gauge trocar needle was advanced into the abscess/fluid collection and a short Amplatz super stiff wire was coiled within the collection. Appropriate positioning was confirmed with a limited CT scan.  The tract was serially dilated allowing placement of a 10 [place] all- purpose drainage catheter.  Appropriate positioning was confirmed with a limited postprocedural CT scan.  40 ml of purulent fluid was aspirated.  The tube was connected to a drainage bag and sutured in place.  A dressing was placed.  The patient tolerated the procedure well without immediate post procedural complication.  Impression:  Successful CT guided placement of a 10 French all purpose drain catheter into the location with aspiration of 40 mL of purulent fluid.  Samples were sent to the laboratory as requested by the ordering clinical team.   Original Report Authenticated By: Tacey Ruiz, MD    Anti-infectives: Anti-infectives   Start     Dose/Rate Route Frequency Ordered Stop   11/11/12 0300  piperacillin-tazobactam (ZOSYN) IVPB 3.375 g     3.375 g 12.5  mL/hr over 240 Minutes Intravenous Every 8 hours 11/10/12 2127     11/10/12 2100  piperacillin-tazobactam (ZOSYN) IVPB 3.375 g     3.375 g 100 mL/hr over 30 Minutes Intravenous  Once 11/10/12 2056 11/10/12 2146      Assessment/Plan: s/p * No surgery found *  LLQ abscess drain placed 7/29 Better Will follow  LOS: 2 days    Vernona Peake A 11/12/2012

## 2012-11-12 NOTE — Progress Notes (Signed)
Mild LLQ tenderness.  Continue clears ans ABX. F/U CT when output goes down. Patient examined and I agree with the assessment and plan  Violeta Gelinas, MD, MPH, FACS Pager: [phone]  11/12/2012 10:26 AM

## 2012-11-12 NOTE — Consult Note (Signed)
[facility] CONSULT Reason for consult:Diverticulitis Referring Physician: Dr Gabriel Cirri in [place] is an 72 y.o. male.  HPI: I saw him apparently for diverticulitis in the late 1980s. Not seen him back since. He reports that he has had 4 or 5 episodes of clinical diverticulitis over the years treated as an outpatient with oral antibiotics. He's never had colonoscopy. Dr. Clovis Riley called me and stated that the patient had acted diverticulitis with abscess CT scan and requested to come to [facility] for treatment. The scan showed sigmoidoscopy colitis abscess. Patient had a percutaneous strain placed by radiology and is on antibiotics. His other problems are diabetes, history of subclavian steal syndrome followed by Dr Eldridge Dace,. He feels much better since placement of the percutaneous drain. Past Medical History  Diagnosis Date  . Diabetes mellitus   . Hyperlipidemia     Is on statin therapy  . Diverticulitis   . Hx of bladder cancer   . Normal nuclear stress test 2010    EF 63% with no ischemia  . Subclavian steal syndrome     left; evaluated by [name]; managed medically  . Skin cancer   . Bladder cancer   . Hypertension     LOV  Dr Shirlee Latch with clearance 05/08/11 and EKG  in [facility]   . CAD (coronary artery disease)     Mild per cath in 2002/last nuclear stress per note  2010    Past Surgical History  Procedure Laterality Date  . Cardiac catheterization  2002    He had a which showed mild coronary atherosclerosis and normal left ventricular function.  . Rotator cuff repair      right  . Tonsillectomy    . Appendectomy    . Cystoscopy with injection      cystoscopy with BCG injections  . Transurethral resection of prostate  05/17/2011    Procedure: TRANSURETHRAL RESECTION OF THE PROSTATE WITH GYRUS INSTRUMENTS;  Surgeon: Anner Crete, MD;  Location: [facility];  Service: Urology;  Laterality: N/A;    Family History  Problem Relation Age of Onset   . Hypertension Mother   . Lung cancer Mother     Social History:  reports that he quit smoking about 57 years ago. His smoking use included Pipe. He has quit using smokeless tobacco. He reports that he does not drink alcohol or use illicit drugs.  Allergies: No Known Allergies  Medications; . enoxaparin (LOVENOX) injection  40 mg Subcutaneous QHS  . insulin aspart  0-9 Units Subcutaneous Q4H  . metoprolol  50 mg Oral BID  . multivitamin with minerals  1 tablet Oral Daily  . pantoprazole (PROTONIX) IV  40 mg Intravenous Q24H  . piperacillin-tazobactam (ZOSYN)  IV  3.375 g Intravenous Q8H  . simvastatin  40 mg Oral QPM   PRN Meds morphine injection, ondansetron Results for orders placed during the hospital encounter of 11/10/12 (from the past 48 hour(s))  CBC WITH DIFFERENTIAL     Status: Abnormal   Collection Time    11/10/12  5:23 PM      Result Value Range   WBC 13.8 (*) 4.0 - 10.5 K/uL   RBC 3.76 (*) 4.22 - 5.81 MIL/uL   Hemoglobin 12.9 (*) 13.0 - 17.0 g/dL   HCT 30.8 (*) 65.7 - 84.6 %   MCV 93.9  78.0 - 100.0 fL   MCH 34.3 (*) 26.0 - 34.0 pg   MCHC 36.5 (*) 30.0 - 36.0 g/dL   RDW  12.8  11.5 - 15.5 %   Platelets 339  150 - 400 K/uL   Neutrophils Relative % 80 (*) 43 - 77 %   Neutro Abs 11.0 (*) 1.7 - 7.7 K/uL   Lymphocytes Relative 9 (*) 12 - 46 %   Lymphs Abs 1.3  0.7 - 4.0 K/uL   Monocytes Relative 11  3 - 12 %   Monocytes Absolute 1.5 (*) 0.1 - 1.0 K/uL   Eosinophils Relative 0  0 - 5 %   Eosinophils Absolute 0.0  0.0 - 0.7 K/uL   Basophils Relative 0  0 - 1 %   Basophils Absolute 0.0  0.0 - 0.1 K/uL  COMPREHENSIVE METABOLIC PANEL     Status: Abnormal   Collection Time    11/10/12  5:23 PM      Result Value Range   Sodium 132 (*) 135 - 145 mEq/L   Potassium 3.5  3.5 - 5.1 mEq/L   Chloride 93 (*) 96 - 112 mEq/L   CO2 24  19 - 32 mEq/L   Glucose, Bld 120 (*) 70 - 99 mg/dL   BUN 13  6 - 23 mg/dL   Creatinine, Ser 1.61  0.50 - 1.35 mg/dL   Calcium 9.0  8.4 -  09.6 mg/dL   Total Protein 6.7  6.0 - 8.3 g/dL   Albumin 3.2 (*) 3.5 - 5.2 g/dL   AST 27  0 - 37 U/L   ALT 37  0 - 53 U/L   Alkaline Phosphatase 79  39 - 117 U/L   Total Bilirubin 0.4  0.3 - 1.2 mg/dL   GFR calc non Af Amer 90 (*) >90 mL/min   GFR calc Af Amer >90  >90 mL/min   Comment:            The eGFR has been calculated     using the CKD EPI equation.     This calculation has not been     validated in all clinical     situations.     eGFR's persistently     <90 mL/min signify     possible Chronic Kidney Disease.  URINALYSIS, ROUTINE W REFLEX MICROSCOPIC     Status: Abnormal   Collection Time    11/10/12  5:34 PM      Result Value Range   Color, Urine YELLOW  YELLOW   APPearance CLEAR  CLEAR   Specific Gravity, Urine 1.010  1.005 - 1.030   pH 6.0  5.0 - 8.0   Glucose, UA NEGATIVE  NEGATIVE mg/dL   Hgb urine dipstick NEGATIVE  NEGATIVE   Bilirubin Urine NEGATIVE  NEGATIVE   Ketones, ur 40 (*) NEGATIVE mg/dL   Protein, ur NEGATIVE  NEGATIVE mg/dL   Urobilinogen, UA 0.2  0.0 - 1.0 mg/dL   Nitrite NEGATIVE  NEGATIVE   Leukocytes, UA NEGATIVE  NEGATIVE   Comment: MICROSCOPIC NOT DONE ON URINES WITH NEGATIVE PROTEIN, BLOOD, LEUKOCYTES, NITRITE, OR GLUCOSE <1000 mg/dL.  GLUCOSE, CAPILLARY     Status: None   Collection Time    11/10/12 11:35 PM      Result Value Range   Glucose-Capillary 99  70 - 99 mg/dL  GLUCOSE, CAPILLARY     Status: None   Collection Time    11/11/12  3:59 AM      Result Value Range   Glucose-Capillary 95  70 - 99 mg/dL  CBC     Status: Abnormal   Collection Time    11/11/12  5:22 AM      Result Value Range   WBC 13.1 (*) 4.0 - 10.5 K/uL   RBC 3.58 (*) 4.22 - 5.81 MIL/uL   Hemoglobin 11.7 (*) 13.0 - 17.0 g/dL   HCT 78.4 (*) 69.6 - 29.5 %   MCV 95.8  78.0 - 100.0 fL   MCH 32.7  26.0 - 34.0 pg   MCHC 34.1  30.0 - 36.0 g/dL   RDW 28.4  13.2 - 44.0 %   Platelets 317  150 - 400 K/uL  BASIC METABOLIC PANEL     Status: Abnormal   Collection Time     11/11/12  5:22 AM      Result Value Range   Sodium 132 (*) 135 - 145 mEq/L   Potassium 3.7  3.5 - 5.1 mEq/L   Chloride 95 (*) 96 - 112 mEq/L   CO2 27  19 - 32 mEq/L   Glucose, Bld 90  70 - 99 mg/dL   BUN 11  6 - 23 mg/dL   Creatinine, Ser 1.02  0.50 - 1.35 mg/dL   Calcium 8.9  8.4 - 72.5 mg/dL   GFR calc non Af Amer 85 (*) >90 mL/min   GFR calc Af Amer >90  >90 mL/min   Comment:            The eGFR has been calculated     using the CKD EPI equation.     This calculation has not been     validated in all clinical     situations.     eGFR's persistently     <90 mL/min signify     possible Chronic Kidney Disease.  GLUCOSE, CAPILLARY     Status: None   Collection Time    11/11/12  7:36 AM      Result Value Range   Glucose-Capillary 90  70 - 99 mg/dL  GLUCOSE, CAPILLARY     Status: None   Collection Time    11/11/12 11:44 AM      Result Value Range   Glucose-Capillary 86  70 - 99 mg/dL  PROTIME-INR     Status: None   Collection Time    11/11/12 12:28 PM      Result Value Range   Prothrombin Time 14.5  11.6 - 15.2 seconds   INR 1.15  0.00 - 1.49  APTT     Status: None   Collection Time    11/11/12 12:28 PM      Result Value Range   aPTT 33  24 - 37 seconds  CULTURE, ROUTINE-ABSCESS     Status: None   Collection Time    11/11/12  3:34 PM      Result Value Range   Specimen Description ABSCESS PELVIS     Special Requests LLQ     Gram Stain       Value: ABUNDANT WBC PRESENT,BOTH PMN AND MONONUCLEAR     NO SQUAMOUS EPITHELIAL CELLS SEEN     RARE GRAM NEGATIVE RODS   Culture Culture reincubated for better growth     Report Status PENDING    GLUCOSE, CAPILLARY     Status: None   Collection Time    11/11/12  4:41 PM      Result Value Range   Glucose-Capillary 78  70 - 99 mg/dL  GLUCOSE, CAPILLARY     Status: Abnormal   Collection Time    11/11/12  7:42 PM      Result Value Range  Glucose-Capillary 159 (*) 70 - 99 mg/dL  GLUCOSE, CAPILLARY     Status: Abnormal    Collection Time    11/11/12 11:33 PM      Result Value Range   Glucose-Capillary 126 (*) 70 - 99 mg/dL  GLUCOSE, CAPILLARY     Status: None   Collection Time    11/12/12  4:08 AM      Result Value Range   Glucose-Capillary 89  70 - 99 mg/dL  CBC     Status: Abnormal   Collection Time    11/12/12  4:45 AM      Result Value Range   WBC 9.7  4.0 - 10.5 K/uL   RBC 3.35 (*) 4.22 - 5.81 MIL/uL   Hemoglobin 11.1 (*) 13.0 - 17.0 g/dL   HCT 96.0 (*) 45.4 - 09.8 %   MCV 95.8  78.0 - 100.0 fL   MCH 33.1  26.0 - 34.0 pg   MCHC 34.6  30.0 - 36.0 g/dL   RDW 11.9  14.7 - 82.9 %   Platelets 310  150 - 400 K/uL  BASIC METABOLIC PANEL     Status: Abnormal   Collection Time    11/12/12  4:45 AM      Result Value Range   Sodium 134 (*) 135 - 145 mEq/L   Potassium 3.9  3.5 - 5.1 mEq/L   Chloride 96  96 - 112 mEq/L   CO2 30  19 - 32 mEq/L   Glucose, Bld 96  70 - 99 mg/dL   BUN 13  6 - 23 mg/dL   Creatinine, Ser 5.62  0.50 - 1.35 mg/dL   Calcium 8.5  8.4 - 13.0 mg/dL   GFR calc non Af Amer 81 (*) >90 mL/min   GFR calc Af Amer >90  >90 mL/min   Comment:            The eGFR has been calculated     using the CKD EPI equation.     This calculation has not been     validated in all clinical     situations.     eGFR's persistently     <90 mL/min signify     possible Chronic Kidney Disease.  GLUCOSE, CAPILLARY     Status: None   Collection Time    11/12/12  7:43 AM      Result Value Range   Glucose-Capillary 94  70 - 99 mg/dL    Ct Image Guided Fluid Drain By Catheter  11/11/2012   *RADIOLOGY REPORT*  Indication: Perforated diverticulitis, now with abscess within the left lower abdomen.  CT GUIDED LEFT LOWER ABDOMINAL DRAINAGE CATHETER PLACEMENT  Comparison: CT abdomen pelvis - 11/10/2012  Medications: Fentanyl 50 mcg IV; Versed 2 mg IV  Total Moderate Sedation time: 20 minutes  Contrast: None  Complications: None immediate  Technique / Findings:  Informed written consent was obtained from the  patient after a discussion of the risks, benefits and alternatives to treatment. The patient was placed supine on the CT gantry and a pre procedural CT was performed re-demonstrating the known abscess/fluid collection within the left lower abdominal quadrant.  The procedure was planned.   A timeout was performed prior to the initiation of the procedure.  The left lower abdomen was prepped and draped in the usual sterile fashion.   The overlying soft tissues were anesthetized with 1% lidocaine with epinephrine.  Appropriate trajectory was planned with the use of a 22 gauge spinal needle.  An 18 gauge trocar needle was advanced into the abscess/fluid collection and a short Amplatz super stiff wire was coiled within the collection. Appropriate positioning was confirmed with a limited CT scan.  The tract was serially dilated allowing placement of a 10 [place] all- purpose drainage catheter.  Appropriate positioning was confirmed with a limited postprocedural CT scan.  40 ml of purulent fluid was aspirated.  The tube was connected to a drainage bag and sutured in place.  A dressing was placed.  The patient tolerated the procedure well without immediate post procedural complication.  Impression:  Successful CT guided placement of a 10 French all purpose drain catheter into the location with aspiration of 40 mL of purulent fluid.  Samples were sent to the laboratory as requested by the ordering clinical team.   Original Report Authenticated By: Tacey Ruiz, MD               Blood pressure 108/55, pulse 60, temperature 98.2 F (36.8 C), temperature source Oral, resp. rate 17, height 6' (1.829 m), weight 72.122 kg (159 lb), SpO2 96.00%.  Physical exam:   General--thin white male in no acute distress Heart--regular rate and rhythm without murmurs are gallops Lungs-- clear Abdomen--none distended with positive bowel sounds with mild tenderness in the left lower quadrant   Assessment: 1. Diverticulitis  with peridiverticular abscess. Patient has had placement of percutaneous strain and feels much better. Long discussion with him about treatment options including possible surgery. He would like to avoid surgery at all possible.  Plan: agree with current plans for percutaneous drainage and antibiotics. Hopefully he will be able to be discharged oral antibiotics and will avoid surgery on this admission. The bigger question will be if he should have surgery in the future due to multiple episodes of diverticulitis.   Tallia Moehring JR,Mariselda Badalamenti L 11/12/2012, 10:59 AM

## 2012-11-12 NOTE — Progress Notes (Signed)
[name]                                                                                Patient Demographics  [name], is a 72 y.o. male, DOB - 1940-12-23, RUE:[id number], JYN:[id number]  Admit date - 11/10/2012  Admitting Physician Zannie Cove, MD  Outpatient Primary MD for the patient is [facility], DO  LOS - 2   Chief Complaint  Patient presents with  . Abdominal Pain        Assessment & Plan    1.LLQ Abd Pain with diverticular abscess - continue empiric antibiotics, general surgery following the patient, he status post a cutaneous drain placement by interventional cardiology on 11/11/2012, continue to monitor output, will require repeat CT scan of the abdomen once output is better, for now continue IV antibiotics, discussed over the phone with ID physician Dr. Luciana Axe, okay to transition to oral antibiotics once clinically better likely duration 2-3 weeks. Advance diet as tolerated per general surgery.   2. CAD (coronary artery disease)- chest pain-free no acute issues, stable Myoview in 2012, he is on metoprolol and statin, we'll resume his aspirin.   3. DM type II he is on sliding scale insulin continue to monitor.  CBG (last 3)   Recent Labs  11/12/12 0408 11/12/12 0743 11/12/12 1214  GLUCAP 89 94 190*     No results found for this basename: HGBA1C    4. Hypertension on beta blocker stable.        Code Status: full  Family Communication: none present  Disposition Plan: home   Procedures CT Abd Pelvis, Perc Drain to LLQ by IR   Consults  CCS,IR   DVT Prophylaxis  Lovenox   Lab Results  Component Value Date   PLT 310 11/12/2012    Medications  Scheduled Meds: . enoxaparin (LOVENOX) injection  40 mg Subcutaneous QHS  . insulin aspart  0-9 Units Subcutaneous Q4H  . metoprolol  50 mg Oral BID  . multivitamin with minerals  1 tablet Oral Daily  . pantoprazole  40 mg Oral Daily  . piperacillin-tazobactam (ZOSYN)  IV   3.375 g Intravenous Q8H  . simvastatin  40 mg Oral QPM   Continuous Infusions: . sodium chloride 100 mL/hr at 11/11/12 1100   PRN Meds:.morphine injection, ondansetron  Antibiotics     Anti-infectives   Start     Dose/Rate Route Frequency Ordered Stop   11/11/12 0300  piperacillin-tazobactam (ZOSYN) IVPB 3.375 g     3.375 g 12.5 mL/hr over 240 Minutes Intravenous Every 8 hours 11/10/12 2127     11/10/12 2100  piperacillin-tazobactam (ZOSYN) IVPB 3.375 g     3.375 g 100 mL/hr over 30 Minutes Intravenous  Once 11/10/12 2056 11/10/12 2146       Time Spent in minutes   35   SINGH,PRASHANT K M.D on 11/12/2012 at 1:57 PM  Between 7am to 7pm - Pager - [phone]  After 7pm go to [email or web address] - password [email or web address]  And look for the night coverage person covering for me after hours  [facility] Office  [phone]    Subjective:   [name] today has, No headache, No chest pain,  improved LLQ abdominal pain - No Nausea, No new weakness tingling or numbness, No Cough - SOB.    Objective:   Filed Vitals:   11/11/12 2058 11/11/12 2104 11/12/12 0211 11/12/12 0507  BP: 128/56 128/56 110/54 108/55  Pulse: 58 60 53 60  Temp: 99.1 F (37.3 C)  98.7 F (37.1 C) 98.2 F (36.8 C)  TempSrc: Oral  Oral Oral  Resp: 18  17 17   Height:      Weight:      SpO2: 94%  99% 96%    Wt Readings from Last 3 Encounters:  11/10/12 72.122 kg (159 lb)  11/23/11 73.084 kg (161 lb 1.9 oz)  05/17/11 72.8 kg (160 lb 7.9 oz)     Intake/Output Summary (Last 24 hours) at 11/12/12 1357 Last data filed at 11/11/12 2030  Gross per 24 hour  Intake      0 ml  Output     50 ml  Net    -50 ml    Exam Awake Alert, Oriented X 3, No new F.N deficits, Normal affect [place].AT,PERRAL Supple Neck,No JVD, No cervical lymphadenopathy appriciated.  Symmetrical Chest wall movement, Good air movement bilaterally, CTAB RRR,No Gallops,Rubs or new Murmurs, No Parasternal Heave +ve B.Sounds, Abd  Soft, LLQ is tender +ve Perc Drain, No organomegaly appriciated, No rebound - guarding or rigidity. No Cyanosis, Clubbing or edema, No new Rash or bruise      Data Review   Micro Results Recent Results (from the past 240 hour(s))  CULTURE, ROUTINE-ABSCESS     Status: None   Collection Time    11/11/12  3:34 PM      Result Value Range Status   Specimen Description ABSCESS PELVIS   Final   Special Requests LLQ   Final   Gram Stain     Final   Value: ABUNDANT WBC PRESENT,BOTH PMN AND MONONUCLEAR     NO SQUAMOUS EPITHELIAL CELLS SEEN     RARE GRAM NEGATIVE RODS   Culture Culture reincubated for better growth   Final   Report Status PENDING   Incomplete    Radiology Reports Ct Image Guided Fluid Drain By Catheter  11/11/2012   *RADIOLOGY REPORT*  Indication: Perforated diverticulitis, now with abscess within the left lower abdomen.  CT GUIDED LEFT LOWER ABDOMINAL DRAINAGE CATHETER PLACEMENT  Comparison: CT abdomen pelvis - 11/10/2012  Medications: Fentanyl 50 mcg IV; Versed 2 mg IV  Total Moderate Sedation time: 20 minutes  Contrast: None  Complications: None immediate  Technique / Findings:  Informed written consent was obtained from the patient after a discussion of the risks, benefits and alternatives to treatment. The patient was placed supine on the CT gantry and a pre procedural CT was performed re-demonstrating the known abscess/fluid collection within the left lower abdominal quadrant.  The procedure was planned.   A timeout was performed prior to the initiation of the procedure.  The left lower abdomen was prepped and draped in the usual sterile fashion.   The overlying soft tissues were anesthetized with 1% lidocaine with epinephrine.  Appropriate trajectory was planned with the use of a 22 gauge spinal needle.  An 18 gauge trocar needle was advanced into the abscess/fluid collection and a short Amplatz super stiff wire was coiled within the collection. Appropriate positioning was  confirmed with a limited CT scan.  The tract was serially dilated allowing placement of a 10 [place] all- purpose drainage catheter.  Appropriate positioning was confirmed with a limited postprocedural CT scan.  40 ml of purulent fluid was aspirated.  The tube was connected to a drainage bag and sutured in place.  A dressing was placed.  The patient tolerated the procedure well without immediate post procedural complication.  Impression:  Successful CT guided placement of a 10 French all purpose drain catheter into the location with aspiration of 40 mL of purulent fluid.  Samples were sent to the laboratory as requested by the ordering clinical team.   Original Report Authenticated By: Tacey Ruiz, MD    CBC  Recent Labs Lab 11/10/12 1723 11/11/12 0522 11/12/12 0445  WBC 13.8* 13.1* 9.7  HGB 12.9* 11.7* 11.1*  HCT 35.3* 34.3* 32.1*  PLT 339 317 310  MCV 93.9 95.8 95.8  MCH 34.3* 32.7 33.1  MCHC 36.5* 34.1 34.6  RDW 12.8 12.9 13.0  LYMPHSABS 1.3  --   --   MONOABS 1.5*  --   --   EOSABS 0.0  --   --   BASOSABS 0.0  --   --     Chemistries   Recent Labs Lab 11/10/12 1723 11/11/12 0522 11/12/12 0445  NA 132* 132* 134*  K 3.5 3.7 3.9  CL 93* 95* 96  CO2 24 27 30   GLUCOSE 120* 90 96  BUN 13 11 13   CREATININE 0.74 0.86 0.97  CALCIUM 9.0 8.9 8.5  AST 27  --   --   ALT 37  --   --   ALKPHOS 79  --   --   BILITOT 0.4  --   --    ------------------------------------------------------------------------------------------------------------------ estimated creatinine clearance is 70.2 ml/min (by C-G formula based on Cr of 0.97). ------------------------------------------------------------------------------------------------------------------ No results found for this basename: HGBA1C,  in the last 72 hours ------------------------------------------------------------------------------------------------------------------ No results found for this basename: CHOL, HDL, LDLCALC, TRIG,  CHOLHDL, LDLDIRECT,  in the last 72 hours ------------------------------------------------------------------------------------------------------------------ No results found for this basename: TSH, T4TOTAL, FREET3, T3FREE, THYROIDAB,  in the last 72 hours ------------------------------------------------------------------------------------------------------------------ No results found for this basename: VITAMINB12, FOLATE, FERRITIN, TIBC, IRON, RETICCTPCT,  in the last 72 hours  Coagulation profile  Recent Labs Lab 11/11/12 1228  INR 1.15    No results found for this basename: DDIMER,  in the last 72 hours  Cardiac Enzymes No results found for this basename: CK, CKMB, TROPONINI, MYOGLOBIN,  in the last 168 hours ------------------------------------------------------------------------------------------------------------------ No components found with this basename: POCBNP,

## 2012-11-12 NOTE — Care Management Note (Signed)
  Page 1 of 1   11/12/2012     2:37:46 PM   CARE MANAGEMENT NOTE 11/12/2012  Patient:  [name], [name]   Account Number:  [id number]  Date Initiated:  11/11/2012  Documentation initiated by:  Jiles Crocker  Subjective/Objective Assessment:   ADMITTED WITH Large Diverticular abscess     Action/Plan:   PCP: Gabriel Cirri, DO  Specialists: Cardiology Dr. Wyvonnia Dusky AT HOME WITH SPOUSE; CM FOLLOWING FOR DCP   Anticipated DC Date:  11/18/2012   Anticipated DC Plan:  HOME W HOME HEALTH SERVICES      DC Planning Services  CM consult      Choice offered to / List presented to:          [facility] arranged  HH-2 PT  HH-1 RN      [facility] agency  [facility]   Status of service:  In process, will continue to follow Medicare Important Message given?  NA - LOS <3 / Initial given by admissions (If response is "NO", the following Medicare IM given date fields will be blank) Date Medicare IM given:   Date Additional Medicare IM given:    Discharge Disposition:    Per UR Regulation:  Reviewed for med. necessity/level of care/duration of stay  If discussed at Long Length of Stay Meetings, dates discussed:    Comments:  11/11/2012- B CHANDLER RN,BSN,MHA

## 2012-11-12 NOTE — Progress Notes (Signed)
Home Health will be provided by [facility] [phone]

## 2012-11-12 NOTE — ED Provider Notes (Signed)
CSN: [id number]     Arrival date & time 11/10/12  1705 History     First MD Initiated Contact with Patient 11/10/12 1857     Chief Complaint  Patient presents with  . Abdominal Pain   (Consider location/radiation/quality/duration/timing/severity/associated sxs/prior Treatment) HPI Comments: Patient has been treated for one week with oral Cipro for diverticulitis. He reports that his pain has progressively worsened rather than improved. He was sent for a CT by his primary doctor and it showed an abscess. PAtient reports that he has seen Dr. Randa Evens of GI in the past and thinks that he was alerted by his doctor. Pain in left lower abdomen, severe. He has had diarrhea, no vomiting.  Patient is a 72 y.o. male presenting with abdominal pain.  Abdominal Pain Associated symptoms include abdominal pain.    Past Medical History  Diagnosis Date  . Diabetes mellitus   . Hyperlipidemia     Is on statin therapy  . Diverticulitis   . Hx of bladder cancer   . Normal nuclear stress test 2010    EF 63% with no ischemia  . Subclavian steal syndrome     left; evaluated by [name]; managed medically  . Skin cancer   . Bladder cancer   . Hypertension     LOV  Dr Shirlee Latch with clearance 05/08/11 and EKG  in [facility]   . CAD (coronary artery disease)     Mild per cath in 2002/last nuclear stress per note  2010   Past Surgical History  Procedure Laterality Date  . Cardiac catheterization  2002    He had a which showed mild coronary atherosclerosis and normal left ventricular function.  . Rotator cuff repair      right  . Tonsillectomy    . Appendectomy    . Cystoscopy with injection      cystoscopy with BCG injections  . Transurethral resection of prostate  05/17/2011    Procedure: TRANSURETHRAL RESECTION OF THE PROSTATE WITH GYRUS INSTRUMENTS;  Surgeon: Anner Crete, MD;  Location: [facility];  Service: Urology;  Laterality: N/A;   Family History  Problem Relation Age of Onset  . Hypertension  Mother   . Lung cancer Mother    History  Substance Use Topics  . Smoking status: Former Smoker -- 55 years    Types: Pipe    Quit date: 05/14/1955  . Smokeless tobacco: Former Neurosurgeon  . Alcohol Use: No    Review of Systems  Constitutional: Positive for chills.  Gastrointestinal: Positive for abdominal pain and diarrhea.  All other systems reviewed and are negative.    Allergies  Review of patient's allergies indicates no known allergies.  Home Medications  No current outpatient prescriptions on file. BP 131/65  Pulse 74  Temp(Src) 98.3 F (36.8 C) (Oral)  Resp 15  Ht 6' (1.829 m)  Wt 159 lb (72.122 kg)  BMI 21.56 kg/m2  SpO2 100% Physical Exam  Constitutional: He is oriented to person, place, and time. He appears well-developed and well-nourished. No distress.  HENT:  Head: Normocephalic and atraumatic.  Right Ear: Hearing normal.  Left Ear: Hearing normal.  Nose: Nose normal.  Mouth/Throat: Oropharynx is clear and moist and mucous membranes are normal.  Eyes: Conjunctivae and EOM are normal. Pupils are equal, round, and reactive to light.  Neck: Normal range of motion. Neck supple.  Cardiovascular: Regular rhythm, S1 normal and S2 normal.  Exam reveals no gallop and no friction rub.   No murmur heard.  Pulmonary/Chest: Effort normal and breath sounds normal. No respiratory distress. He exhibits no tenderness.  Abdominal: Soft. Normal appearance and bowel sounds are normal. There is no hepatosplenomegaly. There is tenderness in the left lower quadrant. There is guarding. There is no rebound, no tenderness at McBurney's point and negative Murphy's sign. No hernia.  Musculoskeletal: Normal range of motion.  Neurological: He is alert and oriented to person, place, and time. He has normal strength. No cranial nerve deficit or sensory deficit. Coordination normal. GCS eye subscore is 4. GCS verbal subscore is 5. GCS motor subscore is 6.  Skin: Skin is warm, dry and intact.  No rash noted. No cyanosis.  Psychiatric: He has a normal mood and affect. His speech is normal and behavior is normal. Thought content normal.    ED Course   Procedures (including critical care time)  Labs Reviewed  CBC WITH DIFFERENTIAL - Abnormal; Notable for the following:    WBC 13.8 (*)    RBC 3.76 (*)    Hemoglobin 12.9 (*)    HCT 35.3 (*)    MCH 34.3 (*)    MCHC 36.5 (*)    Neutrophils Relative % 80 (*)    Neutro Abs 11.0 (*)    Lymphocytes Relative 9 (*)    Monocytes Absolute 1.5 (*)    All other components within normal limits  COMPREHENSIVE METABOLIC PANEL - Abnormal; Notable for the following:    Sodium 132 (*)    Chloride 93 (*)    Glucose, Bld 120 (*)    Albumin 3.2 (*)    GFR calc non Af Amer 90 (*)    All other components within normal limits  URINALYSIS, ROUTINE W REFLEX MICROSCOPIC - Abnormal; Notable for the following:    Ketones, ur 40 (*)    All other components within normal limits  CBC - Abnormal; Notable for the following:    WBC 13.1 (*)    RBC 3.58 (*)    Hemoglobin 11.7 (*)    HCT 34.3 (*)    All other components within normal limits  BASIC METABOLIC PANEL - Abnormal; Notable for the following:    Sodium 132 (*)    Chloride 95 (*)    GFR calc non Af Amer 85 (*)    All other components within normal limits  CBC - Abnormal; Notable for the following:    RBC 3.35 (*)    Hemoglobin 11.1 (*)    HCT 32.1 (*)    All other components within normal limits  BASIC METABOLIC PANEL - Abnormal; Notable for the following:    Sodium 134 (*)    GFR calc non Af Amer 81 (*)    All other components within normal limits  GLUCOSE, CAPILLARY - Abnormal; Notable for the following:    Glucose-Capillary 159 (*)    All other components within normal limits  GLUCOSE, CAPILLARY - Abnormal; Notable for the following:    Glucose-Capillary 126 (*)    All other components within normal limits  GLUCOSE, CAPILLARY - Abnormal; Notable for the following:     Glucose-Capillary 190 (*)    All other components within normal limits  CULTURE, ROUTINE-ABSCESS  GLUCOSE, CAPILLARY  GLUCOSE, CAPILLARY  GLUCOSE, CAPILLARY  PROTIME-INR  APTT  GLUCOSE, CAPILLARY  GLUCOSE, CAPILLARY  GLUCOSE, CAPILLARY  GLUCOSE, CAPILLARY   Ct Image Guided Fluid Drain By Catheter  11/11/2012   *RADIOLOGY REPORT*  Indication: Perforated diverticulitis, now with abscess within the left lower abdomen.  CT GUIDED LEFT LOWER  ABDOMINAL DRAINAGE CATHETER PLACEMENT  Comparison: CT abdomen pelvis - 11/10/2012  Medications: Fentanyl 50 mcg IV; Versed 2 mg IV  Total Moderate Sedation time: 20 minutes  Contrast: None  Complications: None immediate  Technique / Findings:  Informed written consent was obtained from the patient after a discussion of the risks, benefits and alternatives to treatment. The patient was placed supine on the CT gantry and a pre procedural CT was performed re-demonstrating the known abscess/fluid collection within the left lower abdominal quadrant.  The procedure was planned.   A timeout was performed prior to the initiation of the procedure.  The left lower abdomen was prepped and draped in the usual sterile fashion.   The overlying soft tissues were anesthetized with 1% lidocaine with epinephrine.  Appropriate trajectory was planned with the use of a 22 gauge spinal needle.  An 18 gauge trocar needle was advanced into the abscess/fluid collection and a short Amplatz super stiff wire was coiled within the collection. Appropriate positioning was confirmed with a limited CT scan.  The tract was serially dilated allowing placement of a 10 [place] all- purpose drainage catheter.  Appropriate positioning was confirmed with a limited postprocedural CT scan.  40 ml of purulent fluid was aspirated.  The tube was connected to a drainage bag and sutured in place.  A dressing was placed.  The patient tolerated the procedure well without immediate post procedural complication.   Impression:  Successful CT guided placement of a 10 French all purpose drain catheter into the location with aspiration of 40 mL of purulent fluid.  Samples were sent to the laboratory as requested by the ordering clinical team.   Original Report Authenticated By: Tacey Ruiz, MD   1. Colonic diverticular abscess   2. CAD (coronary artery disease)   3. Diabetes mellitus   4. HTN (hypertension)     MDM  PAtient sent to the ER for treatment of complicated diverticulitis. Vital signs are stable. Discussed with surgery, recommended medicine admission. Discussed with GI on call for Dr. Randa Evens, will follow.  Gilda Crease, MD 11/12/12 1539

## 2012-11-13 DIAGNOSIS — K5732 Diverticulitis of large intestine without perforation or abscess without bleeding: Secondary | ICD-10-CM | POA: Diagnosis not present

## 2012-11-13 DIAGNOSIS — K630 Abscess of intestine: Secondary | ICD-10-CM | POA: Diagnosis not present

## 2012-11-13 LAB — CBC
HCT: 32.1 % — ABNORMAL LOW (ref 39.0–52.0)
Hemoglobin: 11.1 g/dL — ABNORMAL LOW (ref 13.0–17.0)
WBC: 6.9 10*3/uL (ref 4.0–10.5)

## 2012-11-13 LAB — BASIC METABOLIC PANEL
BUN: 10 mg/dL (ref 6–23)
Chloride: 101 mEq/L (ref 96–112)
GFR calc Af Amer: >90 mL/min (ref >90)
Glucose, Bld: 88 mg/dL (ref 70–99)
Potassium: 3.6 mEq/L (ref 3.5–5.1)
Sodium: 136 mEq/L (ref 135–145)

## 2012-11-13 LAB — GLUCOSE, CAPILLARY
Glucose-Capillary: 101 mg/dL — ABNORMAL HIGH (ref 70–99)
Glucose-Capillary: 102 mg/dL — ABNORMAL HIGH (ref 70–99)
Glucose-Capillary: 105 mg/dL — ABNORMAL HIGH (ref 70–99)
Glucose-Capillary: 123 mg/dL — ABNORMAL HIGH (ref 70–99)

## 2012-11-13 NOTE — Progress Notes (Signed)
ANTIBIOTIC CONSULT NOTE - FOLLOW UP  Pharmacy Consult for Zosyn Indication: Diverticular abscess  No Known Allergies  Patient Measurements: Height: 6' (182.9 cm) Weight: 159 lb (72.122 kg) IBW/kg (Calculated) : 77.6  Vital Signs: Temp: 97.6 F (36.4 C) (07/31 1423) Temp src: Oral (07/31 1423) BP: 125/66 mmHg (07/31 1423) Pulse Rate: 40 (07/31 1423) Intake/Output from previous day: 07/30 0701 - 07/31 0700 In: 1810 [P.O.:660; I.V.:1000; IV Piggyback:150] Out: 50 [Drains:50] Intake/Output from this shift: Total I/O In: 480 [P.O.:480] Out: 15 [Drains:15]  Labs:  Recent Labs  11/11/12 0522 11/12/12 0445 11/13/12 0440  WBC 13.1* 9.7 6.9  HGB 11.7* 11.1* 11.1*  PLT 317 310 351  CREATININE 0.86 0.97 0.79   Estimated Creatinine Clearance: 85.1 ml/min (by C-G formula based on Cr of 0.79). No results found for this basename: VANCOTROUGH, Leodis Binet, VANCORANDOM, GENTTROUGH, GENTPEAK, GENTRANDOM, TOBRATROUGH, TOBRAPEAK, TOBRARND, AMIKACINPEAK, AMIKACINTROU, AMIKACIN,  in the last 72 hours   Microbiology: Recent Results (from the past 720 hour(s))  CULTURE, ROUTINE-ABSCESS     Status: None   Collection Time    11/11/12  3:34 PM      Result Value Range Status   Specimen Description ABSCESS PELVIS   Final   Special Requests LLQ   Final   Gram Stain     Final   Value: ABUNDANT WBC PRESENT,BOTH PMN AND MONONUCLEAR     NO SQUAMOUS EPITHELIAL CELLS SEEN     RARE GRAM NEGATIVE RODS   Culture ABUNDANT GRAM NEGATIVE RODS   Final   Report Status PENDING   Incomplete    Anti-infectives   Start     Dose/Rate Route Frequency Ordered Stop   11/11/12 0300  piperacillin-tazobactam (ZOSYN) IVPB 3.375 g     3.375 g 12.5 mL/hr over 240 Minutes Intravenous Every 8 hours 11/10/12 2127     11/10/12 2100  piperacillin-tazobactam (ZOSYN) IVPB 3.375 g     3.375 g 100 mL/hr over 30 Minutes Intravenous  Once 11/10/12 2056 11/10/12 2146      Assessment: Patient is a 72 y.o M who presented  to the ED with c/o abdominal pain. Patient was on Cipro PTA for presumed diverticulitis. Abdomen pelvis CT with noted abscess. Patient received first dose zosyn 3.375gm IV in the ED on 7/28 at ~9 PM. S/p percutaneous drain placement 7/29. WBC trending down (now 6.9), afebrile, clinically stable.   Goal of Therapy:  Infection resolution  Plan:  - Continue zosyn 3.375gm IV q8h (infuse over 4 hours) - Follow CBC q72h, clinical status, C&S  Margie Billet, PharmD Clinical Pharmacist - Resident Pager: [phone] Pharmacy: [phone] 11/13/2012 3:03 PM

## 2012-11-13 NOTE — Evaluation (Signed)
Physical Therapy Evaluation Patient Details Name: [name] MRN: [id number] DOB: 1941/03/04 Today's Date: 11/13/2012 Time: 4098-1191 PT Time Calculation (min): 15 min  PT Assessment / Plan / Recommendation History of Present Illness  LLQ Abd Pain with diverticular abscess s/p cutaneous drain placement by interventional cardiology on 11/11/2012.   Clinical Impression  Presents to PT today at baseline. Reports he has been walking the halls of [facility] without difficulty. Encouraged him to continue ambulating. No concerns with balance. No further acute PT needs at this time.    PT Assessment  Patent does not need any further PT services    Follow Up Recommendations  No PT follow up    Does the patient have the potential to tolerate intense rehabilitation      Barriers to Discharge        Equipment Recommendations  None recommended by PT    Recommendations for Other Services     Frequency      Precautions / Restrictions Precautions Precautions: None Restrictions Weight Bearing Restrictions: No   Pertinent Vitals/Pain Reports minimal abdominal discomfort after having drain placed yesterday     Mobility  Bed Mobility Bed Mobility: Supine to Sit;Sit to Supine Supine to Sit: 7: Independent Sit to Supine: 7: Independent Transfers Transfers: Sit to Stand;Stand to Sit Sit to Stand: 7: Independent Stand to Sit: 7: Independent Ambulation/Gait Ambulation/Gait Assistance: 7: Independent Ambulation Distance (Feet): 300 Feet Assistive device: None Gait Pattern: Within Functional Limits Stairs: Yes Stairs Assistance: 6: Modified independent (Device/Increase time) Stair Management Technique: One rail Right;Forwards;Alternating pattern Number of Stairs: 4       PT Goals(Current goals can be found in the care plan section) Acute Rehab PT Goals PT Goal Formulation: No goals set, d/c therapy  Visit Information  Last PT Received On: 11/13/12 Assistance Needed: +1 History of  Present Illness: LLQ Abd Pain with diverticular abscess s/p cutaneous drain placement by interventional cardiology on 11/11/2012.        Prior Functioning  Home Living Family/patient expects to be discharged to:: Private residence Living Arrangements: Spouse/significant other Available Help at Discharge: Available PRN/intermittently Type of Home: House Home Access: Stairs to enter Secretary/administrator of Steps: 2 Entrance Stairs-Rails: None Home Layout: One level Home Equipment: None Prior Function Level of Independence: Independent Comments: walks 2 miles a day Communication Communication: No difficulties    Cognition  Cognition Arousal/Alertness: Awake/alert Behavior During Therapy: WFL for tasks assessed/performed Overall Cognitive Status: Within Functional Limits for tasks assessed    Extremity/Trunk Assessment Upper Extremity Assessment Upper Extremity Assessment: Overall WFL for tasks assessed Lower Extremity Assessment Lower Extremity Assessment: Overall WFL for tasks assessed   Balance Balance Balance Assessed: Yes Static Standing Balance Static Standing - Balance Support: No upper extremity supported Static Standing - Level of Assistance: 7: Independent High Level Balance High Level Balance Activites: Head turns;Sudden stops;Turns;Backward walking High Level Balance Comments: pt able to negotiate pulling IV pole around in the bathroom, was walking backward while pulling it with him (no difficulty); performed portions of DGI with head turns, speed changes, sudden stops and pivot turns without difficulty or LOB, good ability to adjust speed with sudden obstacles or traffic  End of Session PT - End of Session Activity Tolerance: Patient tolerated treatment well Patient left: in bed  GP     Marcell Chavarin H 11/13/2012, 1:59 PM

## 2012-11-13 NOTE — Progress Notes (Signed)
Subjective: LLQ drain placed 7/29 Better daily   Objective: Vital signs in last 24 hours: Temp:  [98.2 F (36.8 C)-98.3 F (36.8 C)] 98.2 F (36.8 C) (07/31 0441) Pulse Rate:  [48-74] 49 (07/31 0441) Resp:  [15-16] 16 (07/31 0441) BP: (108-133)/(53-65) 133/63 mmHg (07/31 0441) SpO2:  [98 %-100 %] 98 % (07/31 0441) Last BM Date: 11/12/12  Intake/Output from previous day: 07/30 0701 - 07/31 0700 In: 1810 [P.O.:660; I.V.:1000; IV Piggyback:150] Out: 50 [Drains:50] Intake/Output this shift: Total I/O In: -  Out: 15 [Drains:15]   PE:  Afeb; vss Wbc wnl Drain intact Site clean and dry; Nt Output 50 cc yesterday; 10 cc in bag now- bloody Cx pending  Lab Results:   Recent Labs  11/12/12 0445 11/13/12 0440  WBC 9.7 6.9  HGB 11.1* 11.1*  HCT 32.1* 32.1*  PLT 310 351   BMET  Recent Labs  11/12/12 0445 11/13/12 0440  NA 134* 136  K 3.9 3.6  CL 96 101  CO2 30 28  GLUCOSE 96 88  BUN 13 10  CREATININE 0.97 0.79  CALCIUM 8.5 8.8   PT/INR  Recent Labs  11/11/12 1228  LABPROT 14.5  INR 1.15   ABG No results found for this basename: PHART, PCO2, PO2, HCO3,  in the last 72 hours  Studies/Results: Ct Image Guided Fluid Drain By Catheter  11/11/2012   *RADIOLOGY REPORT*  Indication: Perforated diverticulitis, now with abscess within the left lower abdomen.  CT GUIDED LEFT LOWER ABDOMINAL DRAINAGE CATHETER PLACEMENT  Comparison: CT abdomen pelvis - 11/10/2012  Medications: Fentanyl 50 mcg IV; Versed 2 mg IV  Total Moderate Sedation time: 20 minutes  Contrast: None  Complications: None immediate  Technique / Findings:  Informed written consent was obtained from the patient after a discussion of the risks, benefits and alternatives to treatment. The patient was placed supine on the CT gantry and a pre procedural CT was performed re-demonstrating the known abscess/fluid collection within the left lower abdominal quadrant.  The procedure was planned.   A timeout was  performed prior to the initiation of the procedure.  The left lower abdomen was prepped and draped in the usual sterile fashion.   The overlying soft tissues were anesthetized with 1% lidocaine with epinephrine.  Appropriate trajectory was planned with the use of a 22 gauge spinal needle.  An 18 gauge trocar needle was advanced into the abscess/fluid collection and a short Amplatz super stiff wire was coiled within the collection. Appropriate positioning was confirmed with a limited CT scan.  The tract was serially dilated allowing placement of a 10 [place] all- purpose drainage catheter.  Appropriate positioning was confirmed with a limited postprocedural CT scan.  40 ml of purulent fluid was aspirated.  The tube was connected to a drainage bag and sutured in place.  A dressing was placed.  The patient tolerated the procedure well without immediate post procedural complication.  Impression:  Successful CT guided placement of a 10 French all purpose drain catheter into the location with aspiration of 40 mL of purulent fluid.  Samples were sent to the laboratory as requested by the ordering clinical team.   Original Report Authenticated By: Tacey Ruiz, MD    Anti-infectives: Anti-infectives   Start     Dose/Rate Route Frequency Ordered Stop   11/11/12 0300  piperacillin-tazobactam (ZOSYN) IVPB 3.375 g     3.375 g 12.5 mL/hr over 240 Minutes Intravenous Every 8 hours 11/10/12 2127     11/10/12  2100  piperacillin-tazobactam (ZOSYN) IVPB 3.375 g     3.375 g 100 mL/hr over 30 Minutes Intravenous  Once 11/10/12 2056 11/10/12 2146      Assessment/Plan: s/p * No surgery found *  LLQ drain in place If goes home with drain: Need to flush daily 5-10cc saline at home Keep site clean and dry Monitor output When output 10 cc or lower in 24 hrs- need Re CT MD to call for OP CT Call [phone] for CT    LOS: 3 days    Johnika Escareno A 11/13/2012

## 2012-11-13 NOTE — Progress Notes (Signed)
[name]                                                                                Patient Demographics  [name], is a 72 y.o. male, DOB - 28-Dec-1940, YNW:[id number], MVH:[id number]  Admit date - 11/10/2012  Admitting Physician Zannie Cove, MD  Outpatient Primary MD for the patient is [facility], DO  LOS - 3   Chief Complaint  Patient presents with  . Abdominal Pain        Assessment & Plan    1.LLQ Abd Pain with diverticular abscess - continue empiric antibiotics, general surgery following the patient, he status post a cutaneous drain placement by interventional cardiology on 11/11/2012, continue to monitor output, will require repeat CT scan of the abdomen once output is better, for now continue IV antibiotics, discussed over the phone with ID physician Dr. Luciana Axe, okay to transition to oral antibiotics once clinically better likely duration 2-3 weeks. Advance diet as tolerated per general surgery. Drain output seems to be going down, discussed with IR repeat CT scan once drain output is 10 cc or less in 24 hours.   2. CAD (coronary artery disease)- chest pain-free no acute issues, stable Myoview in 2012, he is on metoprolol and statin, we'll resume his aspirin.   3. DM type II he is on sliding scale insulin continue to monitor.  CBG (last 3)   Recent Labs  11/13/12 0026 11/13/12 0439 11/13/12 0752  GLUCAP 101* 102* 105*     No results found for this basename: HGBA1C    4. Hypertension on beta blocker stable.        Code Status: full  Family Communication: none present  Disposition Plan: home   Procedures CT Abd Pelvis, Perc Drain to LLQ by IR   Consults  CCS,IR   DVT Prophylaxis  Lovenox   Lab Results  Component Value Date   PLT 351 11/13/2012    Medications  Scheduled Meds: . aspirin  81 mg Oral Daily  . enoxaparin (LOVENOX) injection  40 mg Subcutaneous QHS  . insulin aspart  0-9 Units Subcutaneous Q4H  .  metoprolol  50 mg Oral BID  . multivitamin with minerals  1 tablet Oral Daily  . pantoprazole  40 mg Oral Daily  . piperacillin-tazobactam (ZOSYN)  IV  3.375 g Intravenous Q8H  . simvastatin  40 mg Oral QPM   Continuous Infusions: . sodium chloride 100 mL/hr at 11/11/12 1100   PRN Meds:.morphine injection, ondansetron  Antibiotics     Anti-infectives   Start     Dose/Rate Route Frequency Ordered Stop   11/11/12 0300  piperacillin-tazobactam (ZOSYN) IVPB 3.375 g     3.375 g 12.5 mL/hr over 240 Minutes Intravenous Every 8 hours 11/10/12 2127     11/10/12 2100  piperacillin-tazobactam (ZOSYN) IVPB 3.375 g     3.375 g 100 mL/hr over 30 Minutes Intravenous  Once 11/10/12 2056 11/10/12 2146       Time Spent in minutes   35   Zalen Sequeira K M.D on 11/13/2012 at 10:49 AM  Between 7am to 7pm - Pager - [phone]  After 7pm go to [email or web address] - password [email or web address]  And look  for the night coverage person covering for me after hours  [facility] Office  [phone]    Subjective:   [name] today has, No headache, No chest pain, improved LLQ abdominal pain - No Nausea, No new weakness tingling or numbness, No Cough - SOB.    Objective:   Filed Vitals:   11/12/12 0507 11/12/12 1300 11/12/12 2212 11/13/12 0441  BP: 108/55 131/65 108/53 133/63  Pulse: 60 74 48 49  Temp: 98.2 F (36.8 C) 98.3 F (36.8 C) 98.2 F (36.8 C) 98.2 F (36.8 C)  TempSrc: Oral Oral Oral Oral  Resp: 17 15 16 16   Height:      Weight:      SpO2: 96% 100% 98% 98%    Wt Readings from Last 3 Encounters:  11/10/12 72.122 kg (159 lb)  11/23/11 73.084 kg (161 lb 1.9 oz)  05/17/11 72.8 kg (160 lb 7.9 oz)     Intake/Output Summary (Last 24 hours) at 11/13/12 1049 Last data filed at 11/13/12 0706  Gross per 24 hour  Intake   1630 ml  Output     65 ml  Net   1565 ml    Exam Awake Alert, Oriented X 3, No new F.N deficits, Normal affect [place].AT,PERRAL Supple Neck,No JVD, No  cervical lymphadenopathy appriciated.  Symmetrical Chest wall movement, Good air movement bilaterally, CTAB RRR,No Gallops,Rubs or new Murmurs, No Parasternal Heave +ve B.Sounds, Abd Soft, LLQ is tender +ve Perc Drain, No organomegaly appriciated, No rebound - guarding or rigidity. No Cyanosis, Clubbing or edema, No new Rash or bruise      Data Review   Micro Results Recent Results (from the past 240 hour(s))  CULTURE, ROUTINE-ABSCESS     Status: None   Collection Time    11/11/12  3:34 PM      Result Value Range Status   Specimen Description ABSCESS PELVIS   Final   Special Requests LLQ   Final   Gram Stain     Final   Value: ABUNDANT WBC PRESENT,BOTH PMN AND MONONUCLEAR     NO SQUAMOUS EPITHELIAL CELLS SEEN     RARE GRAM NEGATIVE RODS   Culture ABUNDANT GRAM NEGATIVE RODS   Final   Report Status PENDING   Incomplete    Radiology Reports Ct Image Guided Fluid Drain By Catheter  11/11/2012   *RADIOLOGY REPORT*  Indication: Perforated diverticulitis, now with abscess within the left lower abdomen.  CT GUIDED LEFT LOWER ABDOMINAL DRAINAGE CATHETER PLACEMENT  Comparison: CT abdomen pelvis - 11/10/2012  Medications: Fentanyl 50 mcg IV; Versed 2 mg IV  Total Moderate Sedation time: 20 minutes  Contrast: None  Complications: None immediate  Technique / Findings:  Informed written consent was obtained from the patient after a discussion of the risks, benefits and alternatives to treatment. The patient was placed supine on the CT gantry and a pre procedural CT was performed re-demonstrating the known abscess/fluid collection within the left lower abdominal quadrant.  The procedure was planned.   A timeout was performed prior to the initiation of the procedure.  The left lower abdomen was prepped and draped in the usual sterile fashion.   The overlying soft tissues were anesthetized with 1% lidocaine with epinephrine.  Appropriate trajectory was planned with the use of a 22 gauge spinal needle.  An  18 gauge trocar needle was advanced into the abscess/fluid collection and a short Amplatz super stiff wire was coiled within the collection. Appropriate positioning was confirmed with a  limited CT scan.  The tract was serially dilated allowing placement of a 10 [place] all- purpose drainage catheter.  Appropriate positioning was confirmed with a limited postprocedural CT scan.  40 ml of purulent fluid was aspirated.  The tube was connected to a drainage bag and sutured in place.  A dressing was placed.  The patient tolerated the procedure well without immediate post procedural complication.  Impression:  Successful CT guided placement of a 10 French all purpose drain catheter into the location with aspiration of 40 mL of purulent fluid.  Samples were sent to the laboratory as requested by the ordering clinical team.   Original Report Authenticated By: Tacey Ruiz, MD    CBC  Recent Labs Lab 11/10/12 1723 11/11/12 0522 11/12/12 0445 11/13/12 0440  WBC 13.8* 13.1* 9.7 6.9  HGB 12.9* 11.7* 11.1* 11.1*  HCT 35.3* 34.3* 32.1* 32.1*  PLT 339 317 310 351  MCV 93.9 95.8 95.8 95.8  MCH 34.3* 32.7 33.1 33.1  MCHC 36.5* 34.1 34.6 34.6  RDW 12.8 12.9 13.0 12.9  LYMPHSABS 1.3  --   --   --   MONOABS 1.5*  --   --   --   EOSABS 0.0  --   --   --   BASOSABS 0.0  --   --   --     Chemistries   Recent Labs Lab 11/10/12 1723 11/11/12 0522 11/12/12 0445 11/13/12 0440  NA 132* 132* 134* 136  K 3.5 3.7 3.9 3.6  CL 93* 95* 96 101  CO2 24 27 30 28   GLUCOSE 120* 90 96 88  BUN 13 11 13 10   CREATININE 0.74 0.86 0.97 0.79  CALCIUM 9.0 8.9 8.5 8.8  AST 27  --   --   --   ALT 37  --   --   --   ALKPHOS 79  --   --   --   BILITOT 0.4  --   --   --    ------------------------------------------------------------------------------------------------------------------ estimated creatinine clearance is 85.1 ml/min (by C-G formula based on Cr of  0.79). ------------------------------------------------------------------------------------------------------------------ No results found for this basename: HGBA1C,  in the last 72 hours ------------------------------------------------------------------------------------------------------------------ No results found for this basename: CHOL, HDL, LDLCALC, TRIG, CHOLHDL, LDLDIRECT,  in the last 72 hours ------------------------------------------------------------------------------------------------------------------ No results found for this basename: TSH, T4TOTAL, FREET3, T3FREE, THYROIDAB,  in the last 72 hours ------------------------------------------------------------------------------------------------------------------ No results found for this basename: VITAMINB12, FOLATE, FERRITIN, TIBC, IRON, RETICCTPCT,  in the last 72 hours  Coagulation profile  Recent Labs Lab 11/11/12 1228  INR 1.15    No results found for this basename: DDIMER,  in the last 72 hours  Cardiac Enzymes No results found for this basename: CK, CKMB, TROPONINI, MYOGLOBIN,  in the last 168 hours ------------------------------------------------------------------------------------------------------------------ No components found with this basename: POCBNP,

## 2012-11-13 NOTE — Progress Notes (Signed)
Subjective: Some abdominal "soreness", BM X3 yesterday  Objective: Vital signs in last 24 hours: Temp:  [98.2 F (36.8 C)-98.3 F (36.8 C)] 98.2 F (36.8 C) (07/31 0441) Pulse Rate:  [48-74] 49 (07/31 0441) Resp:  [15-16] 16 (07/31 0441) BP: (108-133)/(53-65) 133/63 mmHg (07/31 0441) SpO2:  [98 %-100 %] 98 % (07/31 0441) Last BM Date: 11/12/12  Intake/Output from previous day: 07/30 0701 - 07/31 0700 In: 1810 [P.O.:660; I.V.:1000; IV Piggyback:150] Out: 50 [Drains:50] Intake/Output this shift: Total I/O In: -  Out: 15 [Drains:15]  General appearance: alert and cooperative Resp: clear to auscultation bilaterally Cardio: regular rate and rhythm GI: soft, some BS, mild tenderness LLQ, drain SS purulent  Lab Results:   Recent Labs  11/12/12 0445 11/13/12 0440  WBC 9.7 6.9  HGB 11.1* 11.1*  HCT 32.1* 32.1*  PLT 310 351   BMET  Recent Labs  11/12/12 0445 11/13/12 0440  NA 134* 136  K 3.9 3.6  CL 96 101  CO2 30 28  GLUCOSE 96 88  BUN 13 10  CREATININE 0.97 0.79  CALCIUM 8.5 8.8   PT/INR  Recent Labs  11/11/12 1228  LABPROT 14.5  INR 1.15   ABG No results found for this basename: PHART, PCO2, PO2, HCO3,  in the last 72 hours  Studies/Results: Ct Image Guided Fluid Drain By Catheter  11/11/2012   *RADIOLOGY REPORT*  Indication: Perforated diverticulitis, now with abscess within the left lower abdomen.  CT GUIDED LEFT LOWER ABDOMINAL DRAINAGE CATHETER PLACEMENT  Comparison: CT abdomen pelvis - 11/10/2012  Medications: Fentanyl 50 mcg IV; Versed 2 mg IV  Total Moderate Sedation time: 20 minutes  Contrast: None  Complications: None immediate  Technique / Findings:  Informed written consent was obtained from the patient after a discussion of the risks, benefits and alternatives to treatment. The patient was placed supine on the CT gantry and a pre procedural CT was performed re-demonstrating the known abscess/fluid collection within the left lower abdominal  quadrant.  The procedure was planned.   A timeout was performed prior to the initiation of the procedure.  The left lower abdomen was prepped and draped in the usual sterile fashion.   The overlying soft tissues were anesthetized with 1% lidocaine with epinephrine.  Appropriate trajectory was planned with the use of a 22 gauge spinal needle.  An 18 gauge trocar needle was advanced into the abscess/fluid collection and a short Amplatz super stiff wire was coiled within the collection. Appropriate positioning was confirmed with a limited CT scan.  The tract was serially dilated allowing placement of a 10 [place] all- purpose drainage catheter.  Appropriate positioning was confirmed with a limited postprocedural CT scan.  40 ml of purulent fluid was aspirated.  The tube was connected to a drainage bag and sutured in place.  A dressing was placed.  The patient tolerated the procedure well without immediate post procedural complication.  Impression:  Successful CT guided placement of a 10 French all purpose drain catheter into the location with aspiration of 40 mL of purulent fluid.  Samples were sent to the laboratory as requested by the ordering clinical team.   Original Report Authenticated By: Tacey Ruiz, MD    Anti-infectives: Anti-infectives   Start     Dose/Rate Route Frequency Ordered Stop   11/11/12 0300  piperacillin-tazobactam (ZOSYN) IVPB 3.375 g     3.375 g 12.5 mL/hr over 240 Minutes Intravenous Every 8 hours 11/10/12 2127     11/10/12 2100  piperacillin-tazobactam (ZOSYN) IVPB 3.375 g     3.375 g 100 mL/hr over 30 Minutes Intravenous  Once 11/10/12 2056 11/10/12 2146      Assessment/Plan: Sigmoid diverticulitis with abscess - Zosyn, continue full liquids, reoeat CT once drain output decreases  LOS: 3 days    Cheralyn Oliver E 11/13/2012

## 2012-11-14 DIAGNOSIS — K630 Abscess of intestine: Secondary | ICD-10-CM | POA: Diagnosis not present

## 2012-11-14 DIAGNOSIS — K5732 Diverticulitis of large intestine without perforation or abscess without bleeding: Secondary | ICD-10-CM | POA: Diagnosis not present

## 2012-11-14 LAB — CBC
HCT: 35.6 % — ABNORMAL LOW (ref 39.0–52.0)
Hemoglobin: 12.3 g/dL — ABNORMAL LOW (ref 13.0–17.0)
MCH: 33.2 pg (ref 26.0–34.0)
MCHC: 34.6 g/dL (ref 30.0–36.0)
MCV: 96.2 fL (ref 78.0–100.0)
RDW: 12.6 % (ref 11.5–15.5)

## 2012-11-14 LAB — CULTURE, ROUTINE-ABSCESS

## 2012-11-14 LAB — GLUCOSE, CAPILLARY
Glucose-Capillary: 143 mg/dL — ABNORMAL HIGH (ref 70–99)
Glucose-Capillary: 172 mg/dL — ABNORMAL HIGH (ref 70–99)
Glucose-Capillary: 185 mg/dL — ABNORMAL HIGH (ref 70–99)
Glucose-Capillary: 92 mg/dL (ref 70–99)

## 2012-11-14 LAB — BASIC METABOLIC PANEL
BUN: 7 mg/dL (ref 6–23)
Calcium: 8.9 mg/dL (ref 8.4–10.5)
Creatinine, Ser: 0.76 mg/dL (ref 0.50–1.35)
GFR calc Af Amer: >90 mL/min (ref >90)
GFR calc non Af Amer: 89 mL/min — ABNORMAL LOW (ref >90)
Glucose, Bld: 92 mg/dL (ref 70–99)

## 2012-11-14 MED ORDER — AMLODIPINE BESYLATE 10 MG PO TABS
10.0000 mg | ORAL_TABLET | Freq: Every day | ORAL | Status: DC
  Administered 2012-11-15 – 2012-11-16 (×2): 10 mg via ORAL
  Filled 2012-11-14 (×3): qty 1

## 2012-11-14 MED ORDER — ATORVASTATIN CALCIUM 20 MG PO TABS
20.0000 mg | ORAL_TABLET | Freq: Every day | ORAL | Status: DC
  Administered 2012-11-15: 20 mg via ORAL
  Filled 2012-11-14 (×3): qty 1

## 2012-11-14 NOTE — Progress Notes (Signed)
[name]                                                                                Patient Demographics  [name], is a 72 y.o. male, DOB - 08-27-1940, ZOX:[id number], WJX:[id number]  Admit date - 11/10/2012  Admitting Physician [name]  Outpatient Primary Dawson for the patient is [facility], DO  LOS - 4   Chief Complaint  Patient presents with  . Abdominal Pain        Assessment & Plan    1.LLQ Abd Pain with diverticular abscess - continue empiric antibiotics, general surgery following the patient, he status post a cutaneous drain placement by interventional cardiology on 11/11/2012, continue to monitor output, will require repeat CT scan of the abdomen once output is better, for now continue IV antibiotics, discussed over the phone with ID physician Antonio. Luciana Dawson, okay to transition to oral antibiotics once clinically better likely duration 2-3 weeks. Advance diet as tolerated per general surgery. Drain output seems to be going down, discussed with IR repeat CT scan once drain output is 10 cc or less in 24 hours. Will likely switch to oral antibiotics on 11-2012 in discharge with home health, he will need repeat CT scan early next week by IR, he will also need to followup closely with general surgery as outpatient. Discussed with [name] in general surgeon today.    2. CAD (coronary artery disease)- chest pain-free no acute issues, stable Myoview in 2012, he is on metoprolol and statin, we'll resume his aspirin.    3. DM type II he is on sliding scale insulin continue to monitor.  CBG (last 3)   Recent Labs  11/14/12 0459 11/14/12 0757 11/14/12 1153  GLUCAP 92 102* 143*     No results found for this basename: HGBA1C     4. Hypertension on beta blocker, Added Norvasc for better control.        Code Status: full  Family Communication: none present  Disposition Plan: home   Procedures CT Abd Pelvis, Perc Drain to LLQ by  IR   Consults  CCS,IR, ID [name] on phone   DVT Prophylaxis  Lovenox   Lab Results  Component Value Date   PLT 410* 11/14/2012    Medications  Scheduled Meds: . amLODipine  10 mg Oral Daily  . aspirin  81 mg Oral Daily  . enoxaparin (LOVENOX) injection  40 mg Subcutaneous QHS  . insulin aspart  0-9 Units Subcutaneous Q4H  . metoprolol  50 mg Oral BID  . multivitamin with minerals  1 tablet Oral Daily  . pantoprazole  40 mg Oral Daily  . piperacillin-tazobactam (ZOSYN)  IV  3.375 g Intravenous Q8H  . simvastatin  40 mg Oral QPM   Continuous Infusions: . sodium chloride 100 mL/hr at 11/13/12 1404   PRN Meds:.morphine injection, ondansetron  Antibiotics     Anti-infectives   Start     Dose/Rate Route Frequency Ordered Stop   11/11/12 0300  piperacillin-tazobactam (ZOSYN) IVPB 3.375 g     3.375 g 12.5 mL/hr over 240 Minutes Intravenous Every 8 hours 11/10/12 2127     11/10/12 2100  piperacillin-tazobactam (ZOSYN) IVPB  3.375 g     3.375 g 100 mL/hr over 30 Minutes Intravenous  Once 11/10/12 2056 11/10/12 2146       Time Spent in minutes   35   [name] K M.D on 11/14/2012 at 12:12 PM  Between 7am to 7pm - Pager - [phone]  After 7pm go to [email or web address] - password [email or web address]  And look for the night coverage person covering for me after hours  [facility] Office  [phone]    Subjective:   [name] today has, No headache, No chest pain, improved LLQ abdominal pain - No Nausea, No new weakness tingling or numbness, No Cough - SOB.    Objective:   Filed Vitals:   11/13/12 1423 11/13/12 2212 11/13/12 2231 11/14/12 0456  BP: 125/66 126/68 128/56 174/62  Pulse: 40 65 55 51  Temp: 97.6 F (36.4 C)  98.2 F (36.8 C) 97.8 F (36.6 C)  TempSrc: Oral  Oral Oral  Resp: 20  16 18   Height:      Weight:      SpO2: 100%  100% 95%    Wt Readings from Last 3 Encounters:  11/10/12 72.122 kg (159 lb)  11/23/11 73.084 kg (161 lb 1.9 oz)   05/17/11 72.8 kg (160 lb 7.9 oz)     Intake/Output Summary (Last 24 hours) at 11/14/12 1212 Last data filed at 11/14/12 0941  Gross per 24 hour  Intake   2740 ml  Output     20 ml  Net   2720 ml    Exam Awake Alert, Oriented X 3, No new F.N deficits, Normal affect [name].AT,PERRAL Supple Neck,No JVD, No cervical lymphadenopathy appriciated.  Symmetrical Chest wall movement, Good air movement bilaterally, CTAB RRR,No Gallops,Rubs or new Murmurs, No Parasternal Heave +ve B.Sounds, Abd Soft, LLQ is tender +ve Perc Drain, No organomegaly appriciated, No rebound - guarding or rigidity. No Cyanosis, Clubbing or edema, No new Rash or bruise      Data Review   Micro Results Recent Results (from the past 240 hour(s))  CULTURE, ROUTINE-ABSCESS     Status: None   Collection Time    11/11/12  3:34 PM      Result Value Range Status   Specimen Description ABSCESS PELVIS   Final   Special Requests LLQ   Final   Gram Stain     Final   Value: ABUNDANT WBC PRESENT,BOTH PMN AND MONONUCLEAR     NO SQUAMOUS EPITHELIAL CELLS SEEN     RARE GRAM NEGATIVE RODS   Culture ABUNDANT ESCHERICHIA COLI   Final   Report Status 11/14/2012 FINAL   Final   Organism ID, Bacteria ESCHERICHIA COLI   Final    Radiology Reports Ct Image Guided Fluid Drain By Catheter  11/11/2012   *RADIOLOGY REPORT*  Indication: Perforated diverticulitis, now with abscess within the left lower abdomen.  CT GUIDED LEFT LOWER ABDOMINAL DRAINAGE CATHETER PLACEMENT  Comparison: CT abdomen pelvis - 11/10/2012  Medications: Fentanyl 50 mcg IV; Versed 2 mg IV  Total Moderate Sedation time: 20 minutes  Contrast: None  Complications: None immediate  Technique / Findings:  Informed written consent was obtained from the patient after a discussion of the risks, benefits and alternatives to treatment. The patient was placed supine on the CT gantry and a pre procedural CT was performed re-demonstrating the known abscess/fluid collection within  the left lower abdominal quadrant.  The procedure was planned.   A timeout was performed prior to the initiation of the procedure.  The left lower abdomen was prepped and draped in the usual sterile fashion.   The overlying soft tissues were anesthetized with 1% lidocaine with epinephrine.  Appropriate trajectory was planned with the use of a 22 gauge spinal needle.  An 18 gauge trocar needle was advanced into the abscess/fluid collection and a short Amplatz super stiff wire was coiled within the collection. Appropriate positioning was confirmed with a limited CT scan.  The tract was serially dilated allowing placement of a 10 [place] all- purpose drainage catheter.  Appropriate positioning was confirmed with a limited postprocedural CT scan.  40 ml of purulent fluid was aspirated.  The tube was connected to a drainage bag and sutured in place.  A dressing was placed.  The patient tolerated the procedure well without immediate post procedural complication.  Impression:  Successful CT guided placement of a 10 French all purpose drain catheter into the location with aspiration of 40 mL of purulent fluid.  Samples were sent to the laboratory as requested by the ordering clinical team.   Original Report Authenticated By: Tacey Ruiz, Dawson    CBC  Recent Labs Lab 11/10/12 1723 11/11/12 0522 11/12/12 0445 11/13/12 0440 11/14/12 0450  WBC 13.8* 13.1* 9.7 6.9 7.0  HGB 12.9* 11.7* 11.1* 11.1* 12.3*  HCT 35.3* 34.3* 32.1* 32.1* 35.6*  PLT 339 317 310 351 410*  MCV 93.9 95.8 95.8 95.8 96.2  MCH 34.3* 32.7 33.1 33.1 33.2  MCHC 36.5* 34.1 34.6 34.6 34.6  RDW 12.8 12.9 13.0 12.9 12.6  LYMPHSABS 1.3  --   --   --   --   MONOABS 1.5*  --   --   --   --   EOSABS 0.0  --   --   --   --   BASOSABS 0.0  --   --   --   --     Chemistries   Recent Labs Lab 11/10/12 1723 11/11/12 0522 11/12/12 0445 11/13/12 0440 11/14/12 0450  NA 132* 132* 134* 136 140  K 3.5 3.7 3.9 3.6 4.4  CL 93* 95* 96 101 103  CO2  24 27 30 28 28   GLUCOSE 120* 90 96 88 92  BUN 13 11 13 10 7   CREATININE 0.74 0.86 0.97 0.79 0.76  CALCIUM 9.0 8.9 8.5 8.8 8.9  AST 27  --   --   --   --   ALT 37  --   --   --   --   ALKPHOS 79  --   --   --   --   BILITOT 0.4  --   --   --   --    ------------------------------------------------------------------------------------------------------------------ estimated creatinine clearance is 85.1 ml/min (by C-G formula based on Cr of 0.76). ------------------------------------------------------------------------------------------------------------------ No results found for this basename: HGBA1C,  in the last 72 hours ------------------------------------------------------------------------------------------------------------------ No results found for this basename: CHOL, HDL, LDLCALC, TRIG, CHOLHDL, LDLDIRECT,  in the last 72 hours ------------------------------------------------------------------------------------------------------------------ No results found for this basename: TSH, T4TOTAL, FREET3, T3FREE, THYROIDAB,  in the last 72 hours ------------------------------------------------------------------------------------------------------------------ No results found for this basename: VITAMINB12, FOLATE, FERRITIN, TIBC, IRON, RETICCTPCT,  in the last 72 hours  Coagulation profile  Recent Labs Lab 11/11/12 1228  INR 1.15    No results found for this basename: DDIMER,  in the last 72 hours  Cardiac Enzymes No results found for this basename: CK, CKMB, TROPONINI, MYOGLOBIN,  in the last 168 hours ------------------------------------------------------------------------------------------------------------------ No components found with this basename: POCBNP,

## 2012-11-14 NOTE — Progress Notes (Signed)
Had another loose BM. Wants to go home.  Will leave on fulls as is still tender.  I discussed the plan with him. Patient examined and I agree with the assessment and plan  Violeta Gelinas, MD, MPH, FACS Pager: [phone]  11/14/2012 10:35 AM

## 2012-11-14 NOTE — Progress Notes (Signed)
  Subjective: Continues to have LLQ pain, no nausea or vomiting.  No fever, chills.    Objective: Vital signs in last 24 hours: Temp:  [97.6 F (36.4 C)-98.2 F (36.8 C)] 97.8 F (36.6 C) (08/01 0456) Pulse Rate:  [40-65] 51 (08/01 0456) Resp:  [16-20] 18 (08/01 0456) BP: (125-174)/(56-68) 174/62 mmHg (08/01 0456) SpO2:  [95 %-100 %] 95 % (08/01 0456) Last BM Date: 11/14/12  Intake/Output from previous day: 07/31 0701 - 08/01 0700 In: 2380 [P.O.:960; I.V.:1400] Out: 35 [Drains:35] Intake/Output this shift:   PE General appearance: alert, cooperative, appears stated age and no distress  Resp: clear to auscultation bilaterally  Cardio: regular rate and rhythm, S1, S2 normal, no murmur, click, rub or gallop  GI: +BS, soft, flat and tender to LLQ and pelvic region. Extremities: extremities normal, atraumatic, no cyanosis or edema  Lab Results:   Recent Labs  11/13/12 0440 11/14/12 0450  WBC 6.9 7.0  HGB 11.1* 12.3*  HCT 32.1* 35.6*  PLT 351 410*   BMET  Recent Labs  11/13/12 0440 11/14/12 0450  NA 136 140  K 3.6 4.4  CL 101 103  CO2 28 28  GLUCOSE 88 92  BUN 10 7  CREATININE 0.79 0.76  CALCIUM 8.8 8.9   PT/INR  Recent Labs  11/11/12 1228  LABPROT 14.5  INR 1.15    Anti-infectives: Anti-infectives   Start     Dose/Rate Route Frequency Ordered Stop   11/11/12 0300  piperacillin-tazobactam (ZOSYN) IVPB 3.375 g     3.375 g 12.5 mL/hr over 240 Minutes Intravenous Every 8 hours 11/10/12 2127     11/10/12 2100  piperacillin-tazobactam (ZOSYN) IVPB 3.375 g     3.375 g 100 mL/hr over 30 Minutes Intravenous  Once 11/10/12 2056 11/10/12 2146      Assessment/Plan: Sigmoid diverticulitis with abscess(second episode since June) -s/p percutaneous drain placement by IR 7/29 -23ml/24hr of output from drain -he is still fairly tender to the LLQ.  Continue with full liquid diet given the amount of discomfort.  Continue with IV antibiotics. -he will need to  be re-scanned early next week. -continue inpatient, discharge when he clinically improves    LOS: 4 days   Courney Garrod ANP-BC  11/14/2012 8:56 AM

## 2012-11-14 NOTE — Progress Notes (Signed)
  Subjective: LLQ drain placed 7/29 Some better Eating well, advancing diet ambulating  Objective: Vital signs in last 24 hours: Temp:  [97.6 F (36.4 C)-98.2 F (36.8 C)] 97.8 F (36.6 C) (08/01 0456) Pulse Rate:  [40-65] 51 (08/01 0456) Resp:  [16-20] 18 (08/01 0456) BP: (125-174)/(56-68) 174/62 mmHg (08/01 0456) SpO2:  [95 %-100 %] 95 % (08/01 0456) Last BM Date: 11/14/12  Intake/Output from previous day: 07/31 0701 - 08/01 0700 In: 2380 [P.O.:960; I.V.:1400] Out: 35 [Drains:35] Intake/Output this shift: Total I/O In: 360 [P.O.:360] Out: -   PE:  Afeb; vss Wbc wnl +Ecoli Output 35 cc yesterday Bloody 20 cc in bag Site clean and dry; minimal tender   Lab Results:   Recent Labs  11/13/12 0440 11/14/12 0450  WBC 6.9 7.0  HGB 11.1* 12.3*  HCT 32.1* 35.6*  PLT 351 410*   BMET  Recent Labs  11/13/12 0440 11/14/12 0450  NA 136 140  K 3.6 4.4  CL 101 103  CO2 28 28  GLUCOSE 88 92  BUN 10 7  CREATININE 0.79 0.76  CALCIUM 8.8 8.9   PT/INR No results found for this basename: LABPROT, INR,  in the last 72 hours ABG No results found for this basename: PHART, PCO2, PO2, HCO3,  in the last 72 hours  Studies/Results: No results found.  Anti-infectives: Anti-infectives   Start     Dose/Rate Route Frequency Ordered Stop   11/11/12 0300  piperacillin-tazobactam (ZOSYN) IVPB 3.375 g     3.375 g 12.5 mL/hr over 240 Minutes Intravenous Every 8 hours 11/10/12 2127     11/10/12 2100  piperacillin-tazobactam (ZOSYN) IVPB 3.375 g     3.375 g 100 mL/hr over 30 Minutes Intravenous  Once 11/10/12 2056 11/10/12 2146      Assessment/Plan: s/p * No surgery found *  Sigmoid divertic abscess drain placed 7/29 Better daily Advancing diet Will follow Home soon- per pt Need flushed daily at home Re CT when output 10cc/d Can do as OP   LOS: 4 days    Lalaine Overstreet A 11/14/2012

## 2012-11-15 DIAGNOSIS — K630 Abscess of intestine: Secondary | ICD-10-CM | POA: Diagnosis not present

## 2012-11-15 DIAGNOSIS — K5732 Diverticulitis of large intestine without perforation or abscess without bleeding: Secondary | ICD-10-CM | POA: Diagnosis not present

## 2012-11-15 LAB — CBC
MCH: 33.2 pg (ref 26.0–34.0)
MCV: 94.6 fL (ref 78.0–100.0)
Platelets: 434 10*3/uL — ABNORMAL HIGH (ref 150–400)
RBC: 3.71 MIL/uL — ABNORMAL LOW (ref 4.22–5.81)
RDW: 12.7 % (ref 11.5–15.5)
WBC: 6.5 10*3/uL (ref 4.0–10.5)

## 2012-11-15 LAB — GLUCOSE, CAPILLARY
Glucose-Capillary: 127 mg/dL — ABNORMAL HIGH (ref 70–99)
Glucose-Capillary: 109 mg/dL — ABNORMAL HIGH (ref 70–99)

## 2012-11-15 LAB — BASIC METABOLIC PANEL
CO2: 24 mEq/L (ref 19–32)
Calcium: 8.9 mg/dL (ref 8.4–10.5)
Chloride: 100 mEq/L (ref 96–112)
Creatinine, Ser: 0.78 mg/dL (ref 0.50–1.35)
GFR calc Af Amer: >90 mL/min (ref >90)
Sodium: 137 mEq/L (ref 135–145)

## 2012-11-15 MED ORDER — INSULIN ASPART 100 UNIT/ML ~~LOC~~ SOLN
0.0000 [IU] | Freq: Three times a day (TID) | SUBCUTANEOUS | Status: DC
  Administered 2012-11-15: 1 [IU] via SUBCUTANEOUS

## 2012-11-15 MED ORDER — LORAZEPAM 0.5 MG PO TABS: 0.5000 mg | ORAL_TABLET | Freq: Every evening | ORAL | Status: DC | PRN

## 2012-11-15 MED ORDER — RISAQUAD PO CAPS
2.0000 | ORAL_CAPSULE | Freq: Every day | ORAL | Status: DC
  Administered 2012-11-15 – 2012-11-16 (×2): 2 via ORAL
  Filled 2012-11-15 (×3): qty 2

## 2012-11-15 MED ORDER — METOPROLOL TARTRATE 25 MG PO TABS
25.0000 mg | ORAL_TABLET | Freq: Two times a day (BID) | ORAL | Status: DC
  Administered 2012-11-15: 11:00:00 via ORAL
  Administered 2012-11-16: 25 mg via ORAL
  Filled 2012-11-15 (×2): qty 1

## 2012-11-15 NOTE — Progress Notes (Signed)
Pt seen.  Agree with above.  Pt unable to really state if pain is better or worse.  Just complains of diarrhea. Check c diff.   Consider advancing diet in AM vs repeat CT depending if better or worse.

## 2012-11-15 NOTE — Progress Notes (Signed)
  Subjective: Pt still having loose stools; LLQ discomfort persists but no worse; denies N/V  Objective: Vital signs in last 24 hours: Temp:  [97.3 F (36.3 C)-98.1 F (36.7 C)] 97.7 F (36.5 C) (08/02 0454) Pulse Rate:  [44-54] 50 (08/02 0454) Resp:  [16-20] 16 (08/02 0454) BP: (139-145)/(66-69) 145/66 mmHg (08/02 0454) SpO2:  [97 %-99 %] 98 % (08/02 0454) Last BM Date: 11/14/12  Intake/Output from previous day: 08/01 0701 - 08/02 0700 In: 1450 [P.O.:840; I.V.:600] Out: 20 [Drains:20] Intake/Output this shift:   LLQ drain intact, insertion site ok, mildly tender,  output 20 cc's, cx's - e coli  Lab Results:   Recent Labs  11/14/12 0450 11/15/12 0520  WBC 7.0 6.5  HGB 12.3* 12.3*  HCT 35.6* 35.1*  PLT 410* 434*   BMET  Recent Labs  11/14/12 0450 11/15/12 0520  NA 140 137  K 4.4 3.9  CL 103 100  CO2 28 24  GLUCOSE 92 93  BUN 7 7  CREATININE 0.76 0.78  CALCIUM 8.9 8.9   PT/INR No results found for this basename: LABPROT, INR,  in the last 72 hours ABG No results found for this basename: PHART, PCO2, PO2, HCO3,  in the last 72 hours  Studies/Results: No results found. Results for orders placed during the hospital encounter of 11/10/12  CULTURE, ROUTINE-ABSCESS     Status: None   Collection Time    11/11/12  3:34 PM      Result Value Range Status   Specimen Description ABSCESS PELVIS   Final   Special Requests LLQ   Final   Gram Stain     Final   Value: ABUNDANT WBC PRESENT,BOTH PMN AND MONONUCLEAR     NO SQUAMOUS EPITHELIAL CELLS SEEN     RARE GRAM NEGATIVE RODS   Culture ABUNDANT ESCHERICHIA COLI   Final   Report Status 11/14/2012 FINAL   Final   Organism ID, Bacteria ESCHERICHIA COLI   Final    Anti-infectives: Anti-infectives   Start     Dose/Rate Route Frequency Ordered Stop   11/11/12 0300  piperacillin-tazobactam (ZOSYN) IVPB 3.375 g     3.375 g 12.5 mL/hr over 240 Minutes Intravenous Every 8 hours 11/10/12 2127     11/10/12 2100   piperacillin-tazobactam (ZOSYN) IVPB 3.375 g     3.375 g 100 mL/hr over 30 Minutes Intravenous  Once 11/10/12 2056 11/10/12 2146      Assessment/Plan: s/p LLQ diverticular abscess drainage 7/29; check c diff; cont current tx; check f/u CT early next week with poss drain injection  LOS: 5 days    ALLRED,D [facility] 11/15/2012

## 2012-11-15 NOTE — Progress Notes (Signed)
[facility] PROGRESS NOTE Subjective Pt ambulating in halls denies pain feels much better after drain  Objective: Vital signs in last 24 hours: Temp:  [97.3 F (36.3 C)-98.1 F (36.7 C)] 97.7 F (36.5 C) (08/02 0454) Pulse Rate:  [44-60] 60 (08/02 1041) Resp:  [16-20] 16 (08/02 0454) BP: (139-145)/(66-69) 145/66 mmHg (08/02 0454) SpO2:  [97 %-99 %] 98 % (08/02 0454) Last BM Date: 11/15/12  Intake/Output from previous day: 08/01 0701 - 08/02 0700 In: 1450 [P.O.:840; I.V.:600] Out: 20 [Drains:20] Intake/Output this shift:    PE: General--NAD Heart-- Lungs-- Abdomen--slight lower tenderness drain in place  Lab Results:  Recent Labs  11/13/12 0440 11/14/12 0450 11/15/12 0520  WBC 6.9 7.0 6.5  HGB 11.1* 12.3* 12.3*  HCT 32.1* 35.6* 35.1*  PLT 351 410* 434*   BMET  Recent Labs  11/13/12 0440 11/14/12 0450 11/15/12 0520  NA 136 140 137  K 3.6 4.4 3.9  CL 101 103 100  CO2 28 28 24   CREATININE 0.79 0.76 0.78   LFT No results found for this basename: PROT, AST, ALT, ALKPHOS, BILITOT, BILIDIR, IBILI,  in the last 72 hours PT/INR No results found for this basename: LABPROT, INR,  in the last 72 hours PANCREAS No results found for this basename: LIPASE,  in the last 72 hours       Studies/Results: No results found.  Medications: I have reviewed the patient's current medications.  Assessment/Plan: 1. Diverticulitis with Abcess. Much improved. Have told pt to see me in several weeks and that at some point he should have colonoscopy.   Cassi Jenne JR,Tymeir Weathington L 11/15/2012, 1:38 PM

## 2012-11-15 NOTE — Progress Notes (Addendum)
[name]                                                                                Patient Demographics  [name], is a 72 y.o. male, DOB - 1940-12-17, ZOX:[id number], WJX:[id number]  Admit date - 11/10/2012  Admitting Physician Zannie Cove, MD  Outpatient Primary MD for the patient is [facility], DO  LOS - 5   Chief Complaint  Patient presents with  . Abdominal Pain        Assessment & Plan    1.LLQ Abd Pain with diverticular abscess - continue empiric antibiotics, general surgery following the patient, he status post a cutaneous drain placement by interventional cardiology on 11/11/2012, continue to monitor output, will require repeat CT scan of the abdomen once output is better, for now continue IV antibiotics, discussed over the phone with ID physician Dr. Luciana Axe, okay to transition to oral antibiotics once clinically better likely duration 2-3 weeks. Advance diet as tolerated per general surgery. Drain output seems to be going down, discussed with IR repeat CT scan once drain output is 10 cc or less in 24 hours. Will likely switch to oral antibiotics on 11-16-2012 in discharge with home health, he will need repeat CT scan early next week by IR, he will also need to followup closely with general surgery as outpatient. His percutaneous drainage tube is to be flushed with 10 cc of normal saline daily.    2. CAD (coronary artery disease)- chest pain-free no acute issues, stable Myoview in 2012, he is on metoprolol and statin, we'll resume his aspirin.    3. DM type II he is on sliding scale insulin continue to monitor.  CBG (last 3)   Recent Labs  11/14/12 2339 11/15/12 0336 11/15/12 0755  GLUCAP 92 98 101*     No results found for this basename: HGBA1C     4. Hypertension on beta blocker, Added Norvasc for better control. He at times becomes bradycardic at night so we'll lower the beta blocker dose and monitor.        Code Status:  full  Family Communication: none present  Disposition Plan: home   Procedures CT Abd Pelvis, Perc Drain to LLQ by IR   Consults  CCS,IR, ID Dr Luciana Axe on phone   DVT Prophylaxis  Lovenox   Lab Results  Component Value Date   PLT 434* 11/15/2012    Medications  Scheduled Meds: . acidophilus  2 capsule Oral Daily  . amLODipine  10 mg Oral Daily  . aspirin  81 mg Oral Daily  . atorvastatin  20 mg Oral q1800  . enoxaparin (LOVENOX) injection  40 mg Subcutaneous QHS  . insulin aspart  0-9 Units Subcutaneous Q4H  . metoprolol  50 mg Oral BID  . multivitamin with minerals  1 tablet Oral Daily  . pantoprazole  40 mg Oral Daily  . piperacillin-tazobactam (ZOSYN)  IV  3.375 g Intravenous Q8H   Continuous Infusions: . sodium chloride 50 mL/hr at 11/15/12 0823   PRN Meds:.LORazepam, morphine injection, ondansetron  Antibiotics     Anti-infectives   Start     Dose/Rate Route Frequency Ordered Stop   11/11/12 0300  piperacillin-tazobactam (ZOSYN) IVPB 3.375 g     3.375 g 12.5 mL/hr over 240 Minutes Intravenous Every 8 hours 11/10/12 2127     11/10/12 2100  piperacillin-tazobactam (ZOSYN) IVPB 3.375 g     3.375 g 100 mL/hr over 30 Minutes Intravenous  Once 11/10/12 2056 11/10/12 2146       Time Spent in minutes   35   SINGH,PRASHANT K M.D on 11/15/2012 at 10:26 AM  Between 7am to 7pm - Pager - [phone]  After 7pm go to [email or web address] - password [email or web address]  And look for the night coverage person covering for me after hours  [facility] Office  [phone]    Subjective:   Ziair Penson today has, No headache, No chest pain, improved LLQ abdominal pain - No Nausea, No new weakness tingling or numbness, No Cough - SOB.    Objective:   Filed Vitals:   11/14/12 1346 11/14/12 2119 11/14/12 2157 11/15/12 0454  BP: 144/69 139/66 139/66 145/66  Pulse: 44 54 54 50  Temp: 97.3 F (36.3 C) 98.1 F (36.7 C)  97.7 F (36.5 C)  TempSrc: Oral   Oral  Resp: 20  18  16   Height:      Weight:      SpO2: 99% 97%  98%    Wt Readings from Last 3 Encounters:  11/10/12 72.122 kg (159 lb)  11/23/11 73.084 kg (161 lb 1.9 oz)  05/17/11 72.8 kg (160 lb 7.9 oz)     Intake/Output Summary (Last 24 hours) at 11/15/12 1026 Last data filed at 11/15/12 0546  Gross per 24 hour  Intake   1090 ml  Output     20 ml  Net   1070 ml    Exam Awake Alert, Oriented X 3, No new F.N deficits, Normal affect [place].AT,PERRAL Supple Neck,No JVD, No cervical lymphadenopathy appriciated.  Symmetrical Chest wall movement, Good air movement bilaterally, CTAB RRR,No Gallops,Rubs or new Murmurs, No Parasternal Heave +ve B.Sounds, Abd Soft, LLQ is tender +ve Perc Drain, No organomegaly appriciated, No rebound - guarding or rigidity. No Cyanosis, Clubbing or edema, No new Rash or bruise      Data Review   Micro Results Recent Results (from the past 240 hour(s))  CULTURE, ROUTINE-ABSCESS     Status: None   Collection Time    11/11/12  3:34 PM      Result Value Range Status   Specimen Description ABSCESS PELVIS   Final   Special Requests LLQ   Final   Gram Stain     Final   Value: ABUNDANT WBC PRESENT,BOTH PMN AND MONONUCLEAR     NO SQUAMOUS EPITHELIAL CELLS SEEN     RARE GRAM NEGATIVE RODS   Culture ABUNDANT ESCHERICHIA COLI   Final   Report Status 11/14/2012 FINAL   Final   Organism ID, Bacteria ESCHERICHIA COLI   Final    Radiology Reports Ct Image Guided Fluid Drain By Catheter  11/11/2012   *RADIOLOGY REPORT*  Indication: Perforated diverticulitis, now with abscess within the left lower abdomen.  CT GUIDED LEFT LOWER ABDOMINAL DRAINAGE CATHETER PLACEMENT  Comparison: CT abdomen pelvis - 11/10/2012  Medications: Fentanyl 50 mcg IV; Versed 2 mg IV  Total Moderate Sedation time: 20 minutes  Contrast: None  Complications: None immediate  Technique / Findings:  Informed written consent was obtained from the patient after a discussion of the risks, benefits and  alternatives to treatment. The patient was placed supine on the CT gantry and a pre procedural  CT was performed re-demonstrating the known abscess/fluid collection within the left lower abdominal quadrant.  The procedure was planned.   A timeout was performed prior to the initiation of the procedure.  The left lower abdomen was prepped and draped in the usual sterile fashion.   The overlying soft tissues were anesthetized with 1% lidocaine with epinephrine.  Appropriate trajectory was planned with the use of a 22 gauge spinal needle.  An 18 gauge trocar needle was advanced into the abscess/fluid collection and a short Amplatz super stiff wire was coiled within the collection. Appropriate positioning was confirmed with a limited CT scan.  The tract was serially dilated allowing placement of a 10 [place] all- purpose drainage catheter.  Appropriate positioning was confirmed with a limited postprocedural CT scan.  40 ml of purulent fluid was aspirated.  The tube was connected to a drainage bag and sutured in place.  A dressing was placed.  The patient tolerated the procedure well without immediate post procedural complication.  Impression:  Successful CT guided placement of a 10 French all purpose drain catheter into the location with aspiration of 40 mL of purulent fluid.  Samples were sent to the laboratory as requested by the ordering clinical team.   Original Report Authenticated By: Tacey Ruiz, MD    CBC  Recent Labs Lab 11/10/12 1723 11/11/12 0522 11/12/12 0445 11/13/12 0440 11/14/12 0450 11/15/12 0520  WBC 13.8* 13.1* 9.7 6.9 7.0 6.5  HGB 12.9* 11.7* 11.1* 11.1* 12.3* 12.3*  HCT 35.3* 34.3* 32.1* 32.1* 35.6* 35.1*  PLT 339 317 310 351 410* 434*  MCV 93.9 95.8 95.8 95.8 96.2 94.6  MCH 34.3* 32.7 33.1 33.1 33.2 33.2  MCHC 36.5* 34.1 34.6 34.6 34.6 35.0  RDW 12.8 12.9 13.0 12.9 12.6 12.7  LYMPHSABS 1.3  --   --   --   --   --   MONOABS 1.5*  --   --   --   --   --   EOSABS 0.0  --   --   --    --   --   BASOSABS 0.0  --   --   --   --   --     Chemistries   Recent Labs Lab 11/10/12 1723 11/11/12 0522 11/12/12 0445 11/13/12 0440 11/14/12 0450 11/15/12 0520  NA 132* 132* 134* 136 140 137  K 3.5 3.7 3.9 3.6 4.4 3.9  CL 93* 95* 96 101 103 100  CO2 24 27 30 28 28 24   GLUCOSE 120* 90 96 88 92 93  BUN 13 11 13 10 7 7   CREATININE 0.74 0.86 0.97 0.79 0.76 0.78  CALCIUM 9.0 8.9 8.5 8.8 8.9 8.9  AST 27  --   --   --   --   --   ALT 37  --   --   --   --   --   ALKPHOS 79  --   --   --   --   --   BILITOT 0.4  --   --   --   --   --    ------------------------------------------------------------------------------------------------------------------ estimated creatinine clearance is 85.1 ml/min (by C-G formula based on Cr of 0.78). ------------------------------------------------------------------------------------------------------------------ No results found for this basename: HGBA1C,  in the last 72 hours ------------------------------------------------------------------------------------------------------------------ No results found for this basename: CHOL, HDL, LDLCALC, TRIG, CHOLHDL, LDLDIRECT,  in the last 72 hours ------------------------------------------------------------------------------------------------------------------ No results found for this basename: TSH, T4TOTAL, FREET3, T3FREE, THYROIDAB,  in the  last 72 hours ------------------------------------------------------------------------------------------------------------------ No results found for this basename: VITAMINB12, FOLATE, FERRITIN, TIBC, IRON, RETICCTPCT,  in the last 72 hours  Coagulation profile  Recent Labs Lab 11/11/12 1228  INR 1.15    No results found for this basename: DDIMER,  in the last 72 hours  Cardiac Enzymes No results found for this basename: CK, CKMB, TROPONINI, MYOGLOBIN,  in the last 168  hours ------------------------------------------------------------------------------------------------------------------ No components found with this basename: POCBNP,

## 2012-11-15 NOTE — Progress Notes (Signed)
  Subjective: Pt had a bad night, increased diarrhea, urinating.  Denies fever chills.  Ambulating in the hallways. Still have considerable amount of abdominal pain on full liquid.  Objective: Vital signs in last 24 hours: Temp:  [97.3 F (36.3 C)-98.1 F (36.7 C)] 97.7 F (36.5 C) (08/02 0454) Pulse Rate:  [44-54] 50 (08/02 0454) Resp:  [16-20] 16 (08/02 0454) BP: (139-145)/(66-69) 145/66 mmHg (08/02 0454) SpO2:  [97 %-99 %] 98 % (08/02 0454) Last BM Date: 11/14/12  Intake/Output from previous day: 08/01 0701 - 08/02 0700 In: 1450 [P.O.:840; I.V.:600] Out: 20 [Drains:20] Intake/Output this shift:   PE  General appearance: alert, cooperative, appears stated age and no distress  Resp: clear to auscultation bilaterally  Cardio: regular rate and rhythm, S1, S2 normal, no murmur, click, rub or gallop  GI: +BS, soft, flat and tender to LLQ and pelvic region.  Extremities: extremities normal, atraumatic, no cyanosis or edema  Lab Results:   Recent Labs  11/14/12 0450 11/15/12 0520  WBC 7.0 6.5  HGB 12.3* 12.3*  HCT 35.6* 35.1*  PLT 410* 434*   BMET  Recent Labs  11/14/12 0450 11/15/12 0520  NA 140 137  K 4.4 3.9  CL 103 100  CO2 28 24  GLUCOSE 92 93  BUN 7 7  CREATININE 0.76 0.78  CALCIUM 8.9 8.9    Anti-infectives: Anti-infectives   Start     Dose/Rate Route Frequency Ordered Stop   11/11/12 0300  piperacillin-tazobactam (ZOSYN) IVPB 3.375 g     3.375 g 12.5 mL/hr over 240 Minutes Intravenous Every 8 hours 11/10/12 2127     11/10/12 2100  piperacillin-tazobactam (ZOSYN) IVPB 3.375 g     3.375 g 100 mL/hr over 30 Minutes Intravenous  Once 11/10/12 2056 11/10/12 2146      Assessment/Plan: Sigmoid diverticulitis with abscess(second episode since June) -s/p percutaneous drain placement by IR 7/29 -56ml/24h output from drain -he has not had much improvement in his symptoms and having a lot of diarrhea.  Obtain a stool for c.diff.  We will plan to  advance to low residue diet tomorrow.  If his pain does not get worse we will plan to discharge him and repeat a CT scan mid week.  If he worsens or WBC increases then we will repeat a ct tomorrow.   -E coli per culture, sensitive to zosyn(start on 11/12/12)  Anxiety/insomnia -PRN ativan at HS   LOS: 5 days   Bonner Puna [facility] ANP-BC Pager [phone]  11/15/2012 9:03 AM

## 2012-11-16 DIAGNOSIS — K630 Abscess of intestine: Secondary | ICD-10-CM | POA: Diagnosis not present

## 2012-11-16 DIAGNOSIS — K5732 Diverticulitis of large intestine without perforation or abscess without bleeding: Secondary | ICD-10-CM | POA: Diagnosis not present

## 2012-11-16 LAB — BASIC METABOLIC PANEL
BUN: 7 mg/dL (ref 6–23)
Chloride: 101 mEq/L (ref 96–112)
GFR calc Af Amer: >90 mL/min (ref >90)
Potassium: 3.7 mEq/L (ref 3.5–5.1)

## 2012-11-16 LAB — CBC
HCT: 34.2 % — ABNORMAL LOW (ref 39.0–52.0)
RDW: 12.6 % (ref 11.5–15.5)
WBC: 6.2 10*3/uL (ref 4.0–10.5)

## 2012-11-16 LAB — GLUCOSE, CAPILLARY: Glucose-Capillary: 112 mg/dL — ABNORMAL HIGH (ref 70–99)

## 2012-11-16 MED ORDER — RISAQUAD PO CAPS: 2.0000 | ORAL_CAPSULE | Freq: Every day | ORAL | Status: DC

## 2012-11-16 MED ORDER — CIPROFLOXACIN HCL 500 MG PO TABS
500.0000 mg | ORAL_TABLET | Freq: Two times a day (BID) | ORAL | Status: DC
  Administered 2012-11-16: 500 mg via ORAL
  Filled 2012-11-16: qty 1

## 2012-11-16 MED ORDER — CIPROFLOXACIN HCL 500 MG PO TABS: 500.0000 mg | ORAL_TABLET | Freq: Two times a day (BID) | ORAL | Status: DC

## 2012-11-16 NOTE — Discharge Summary (Signed)
[name]                                                                                   [name], is a 72 y.o. male  DOB [birth year]  MRN [id number].  Admission date:  11/10/2012  Discharge Date:  11/16/2012  Primary MD  Gabriel Cirri, DO  Admitting Physician  Zannie Cove, MD  Admission Diagnosis  HTN (hypertension) [401.9] CAD (coronary artery disease) [414.00] Diabetes mellitus [250.00] Colonic diverticular abscess [562.11]  Discharge Diagnosis     Active Problems:   CAD (coronary artery disease)   HTN (hypertension)   Colonic diverticular abscess   Diabetes mellitus    Past Medical History  Diagnosis Date  . Diabetes mellitus   . Hyperlipidemia     Is on statin therapy  . Diverticulitis   . Hx of bladder cancer   . Normal nuclear stress test 2010    EF 63% with no ischemia  . Subclavian steal syndrome     left; evaluated by [name]; managed medically  . Skin cancer   . Bladder cancer   . Hypertension     LOV  Dr Shirlee Latch with clearance 05/08/11 and EKG  in EPIC   . CAD (coronary artery disease)     Mild per cath in 2002/last nuclear stress per note  2010    Past Surgical History  Procedure Laterality Date  . Cardiac catheterization  2002    He had a which showed mild coronary atherosclerosis and normal left ventricular function.  . Rotator cuff repair      right  . Tonsillectomy    . Appendectomy    . Cystoscopy with injection      cystoscopy with BCG injections  . Transurethral resection of prostate  05/17/2011    Procedure: TRANSURETHRAL RESECTION OF THE PROSTATE WITH GYRUS INSTRUMENTS;  Surgeon: Anner Crete, MD;  Location: [facility];  Service: Urology;  Laterality: N/A;     Recommendations for primary care physician for things to follow:   Follow Perc drain output, CBC, BMP , repeat CT in 2-3 days of Abd-Pelvis   Discharge Diagnoses:   Active Problems:   CAD (coronary artery disease)   HTN (hypertension)   Colonic  diverticular abscess   Diabetes mellitus    Discharge Condition: stable   Diet recommendation: See Discharge Instructions below   Consults CCS,GI,IR    History of present illness and  Hospital Course:     Kindly see H&P for history of present illness and admission details, please review complete Labs, Consult reports and Test reports for all details in brief [name], is a 72 y.o. male, patient with history of diabetes mellitus 2, CAD, hypertension, was admitted to the hospital with daily history of abdominal pain secondary to diverticular abscess which was moderate to large size, he was seen by GI and general surgery both recommended percutaneous drainage placement by interventional radiology which was done, his symptoms remarkably improved and and his drainage is down to 20 cc a day, he was initially kept on IV Zosyn and now has been transitioned to oral Cipro based on his current check sensitivities. I had discussed  his case with infectious disease over the phone and and agreed on transitioning to oral antibiotics for 10 to 14 days  post discharge he will follow with an IR, general surgery and GI on a close basis, I have discussed his case with IR physician on-call today who will arrange for outpatient follow up by calling patient's house tommorow. RN will teach patient injecting his drain with 10 cc of normal saline. He'll be given a bottle of normal saline and a syringe to go home with.   His other chronic medical problems including CAD, hypertension and diabetes are stable and he will resume his home medications unchanged.      Today   Subjective:   [name] today has no headache,no chest abdominal pain,no new weakness tingling or numbness, feels much better wants to go home today.    Objective:   Blood pressure 131/71, pulse 54, temperature 97.5 F (36.4 C), temperature source Oral, resp. rate 16, height 6' (1.829 m), weight 72.122 kg (159 lb), SpO2  97.00%.   Intake/Output Summary (Last 24 hours) at 11/16/12 1147 Last data filed at 11/16/12 0603  Gross per 24 hour  Intake      0 ml  Output     20 ml  Net    -20 ml    Exam Awake Alert, Oriented *3, No new F.N deficits, Normal affect Portia.AT,PERRAL Supple Neck,No JVD, No cervical lymphadenopathy appriciated.  Symmetrical Chest wall movement, Good air movement bilaterally, CTAB RRR,No Gallops,Rubs or new Murmurs, No Parasternal Heave +ve B.Sounds, Abd Soft, mild left lower quadrant tenderness,, No organomegaly appriciated, No rebound -guarding or rigidity. Perc.drain in place No Cyanosis, Clubbing or edema, No new Rash or bruise  Data Review   Major procedures and Radiology Reports - PLEASE review detailed and final reports for all details, in brief -       Ct Image Guided Fluid Drain By Catheter  11/11/2012   *RADIOLOGY REPORT*  Indication: Perforated diverticulitis, now with abscess within the left lower abdomen.  CT GUIDED LEFT LOWER ABDOMINAL DRAINAGE CATHETER PLACEMENT  Comparison: CT abdomen pelvis - 11/10/2012  Medications: Fentanyl 50 mcg IV; Versed 2 mg IV  Total Moderate Sedation time: 20 minutes  Contrast: None  Complications: None immediate  Technique / Findings:  Informed written consent was obtained from the patient after a discussion of the risks, benefits and alternatives to treatment. The patient was placed supine on the CT gantry and a pre procedural CT was performed re-demonstrating the known abscess/fluid collection within the left lower abdominal quadrant.  The procedure was planned.   A timeout was performed prior to the initiation of the procedure.  The left lower abdomen was prepped and draped in the usual sterile fashion.   The overlying soft tissues were anesthetized with 1% lidocaine with epinephrine.  Appropriate trajectory was planned with the use of a 22 gauge spinal needle.  An 18 gauge trocar needle was advanced into the abscess/fluid collection and a short  Amplatz super stiff wire was coiled within the collection. Appropriate positioning was confirmed with a limited CT scan.  The tract was serially dilated allowing placement of a 10 [place] all- purpose drainage catheter.  Appropriate positioning was confirmed with a limited postprocedural CT scan.  40 ml of purulent fluid was aspirated.  The tube was connected to a drainage bag and sutured in place.  A dressing was placed.  The patient tolerated the procedure well without immediate post procedural complication.  Impression:  Successful CT  guided placement of a 10 [place] all purpose drain catheter into the location with aspiration of 40 mL of purulent fluid.  Samples were sent to the laboratory as requested by the ordering clinical team.   Original Report Authenticated By: Tacey Ruiz, MD    Micro Results      Recent Results (from the past 240 hour(s))  CULTURE, ROUTINE-ABSCESS     Status: None   Collection Time    11/11/12  3:34 PM      Result Value Range Status   Specimen Description ABSCESS PELVIS   Final   Special Requests LLQ   Final   Gram Stain     Final   Value: ABUNDANT WBC PRESENT,BOTH PMN AND MONONUCLEAR     NO SQUAMOUS EPITHELIAL CELLS SEEN     RARE GRAM NEGATIVE RODS   Culture ABUNDANT ESCHERICHIA COLI   Final   Report Status 11/14/2012 FINAL   Final   Organism ID, Bacteria ESCHERICHIA COLI   Final  CLOSTRIDIUM DIFFICILE BY PCR     Status: None   Collection Time    11/15/12  8:52 AM      Result Value Range Status   C difficile by pcr NEGATIVE  NEGATIVE Final     CBC w Diff: Lab Results  Component Value Date   WBC 6.2 11/16/2012   HGB 12.1* 11/16/2012   HCT 34.2* 11/16/2012   PLT 421* 11/16/2012   LYMPHOPCT 9* 11/10/2012   MONOPCT 11 11/10/2012   EOSPCT 0 11/10/2012   BASOPCT 0 11/10/2012    CMP: Lab Results  Component Value Date   NA 135 11/16/2012   K 3.7 11/16/2012   CL 101 11/16/2012   CO2 25 11/16/2012   BUN 7 11/16/2012   CREATININE 0.76 11/16/2012   PROT 6.7 11/10/2012    ALBUMIN 3.2* 11/10/2012   BILITOT 0.4 11/10/2012   ALKPHOS 79 11/10/2012   AST 27 11/10/2012   ALT 37 11/10/2012  .   Discharge Instructions     Inject your drain with 10cc Saline as instructed daily.  Follow with Primary MD MITCHELL,RAJAN, DO in 3 days   Get CBC, CMP, checked 3 days by Primary MD and again as instructed by your Primary MD. Get a 2 view Chest X ray done next visit if you had Pneumonia of Lung problems at the Hospital.  Get Medicines reviewed and adjusted.  Please request your Prim.MD to go over all Hospital Tests and Procedure/Radiological results at the follow up, please get all Hospital records sent to your Prim MD by signing hospital release before you go home.  Activity: As tolerated with Full fall precautions use walker/cane & assistance as needed   Diet:  Low fiber Heart Healthy - Low carb  For Heart failure patients - Check your Weight same time everyday, if you gain over 2 pounds, or you develop in leg swelling, experience more shortness of breath or chest pain, call your Primary MD immediately. Follow Cardiac Low Salt Diet and 1.8 lit/day fluid restriction.  Disposition Home    If you experience worsening of your admission symptoms, develop shortness of breath, life threatening emergency, suicidal or homicidal thoughts you must seek medical attention immediately by calling 911 or calling your MD immediately  if symptoms less severe.  You Must read complete instructions/literature along with all the possible adverse reactions/side effects for all the Medicines you take and that have been prescribed to you. Take any new Medicines after you have completely understood and accpet  all the possible adverse reactions/side effects.   Do not drive and provide baby sitting services if your were admitted for syncope or siezures until you have seen by Primary MD or a Neurologist and advised to do so again.  Do not drive when taking Pain medications.    Do not take more  than prescribed Pain, Sleep and Anxiety Medications  Special Instructions: If you have smoked or chewed Tobacco  in the last 2 yrs please stop smoking, stop any regular Alcohol  and or any Recreational drug use.  Wear Seat belts while driving.   Please note  You were cared for by a hospitalist during your hospital stay. If you have any questions about your discharge medications or the care you received while you were in the hospital after you are discharged, you can call the unit and asked to speak with the hospitalist on call if the hospitalist that took care of you is not available. Once you are discharged, your primary care physician will handle any further medical issues. Please note that NO REFILLS for any discharge medications will be authorized once you are discharged, as it is imperative that you return to your primary care physician (or establish a relationship with a primary care physician if you do not have one) for your aftercare needs so that they can reassess your need for medications and monitor your lab values.    Follow-up Information   Follow up with [facility], DO. Schedule an appointment as soon as possible for a visit in 3 days.   Contact information:   [address] [place] [address] [phone]       Follow up with WATTS, Lennon Alstrom, MD. Schedule an appointment as soon as possible for a visit in 2 days.   Contact information:   [address] [place] [address] [phone]       Follow up with [facility], MD. Schedule an appointment as soon as possible for a visit in 1 week.   Contact information:   [name] [place] [address] [phone]       Follow up with Samuel Germany L, MD. Schedule an appointment as soon as possible for a visit in 1 week.   Contact information:   [address]                         [place] [address] [phone]         Discharge  Medications     Medication List         acidophilus Caps  Take 2 capsules by mouth daily.     aspirin 81 MG tablet  Take 81 mg by mouth daily.     BIOTIN PO  Take 1 tablet by mouth daily.     ciprofloxacin 500 MG tablet  Commonly known as:  CIPRO  Take 1 tablet (500 mg total) by mouth 2 (two) times daily.     dicyclomine 20 MG tablet  Commonly known as:  BENTYL  Take 20 mg by mouth 4 (four) times daily as needed (for abdominal pain).     fish oil-omega-3 fatty acids 1000 MG capsule  Take 1 g by mouth daily.     glipiZIDE 5 MG tablet  Commonly known as:  GLUCOTROL  Take 2.5 mg by mouth daily before breakfast.     metoprolol 50 MG tablet  Commonly known as:  LOPRESSOR  Take 1 tablet (50 mg total) by mouth 2 (two) times daily.     multivitamin tablet  Take 1 tablet by mouth daily.     psyllium 58.6 % powder  Commonly known as:  METAMUCIL  Take 1 packet by mouth daily.     simvastatin 40 MG tablet  Commonly known as:  ZOCOR  Take 1 tablet (40 mg total) by mouth every evening.     VITAMIN B 12 PO  Take by mouth as directed.           Total Time in preparing paper work, data evaluation and todays exam - 35 minutes  Leroy Sea M.D on 11/16/2012 at 11:47 AM  [facility] Office  [phone]

## 2012-11-16 NOTE — Progress Notes (Signed)
Patient instructed on how to flush drain with 5 cc NS daily, to keep up with the drain output and to do dressing change at the site every 2 days.  He demonsrtrated and verbalized understanding.

## 2012-11-16 NOTE — Progress Notes (Signed)
  Subjective: Pt feeling much better today.  Little soreness at drain insertion site.  Still having diarrhea, c diff negative. Ambulating.  Denies fever or chills.  Denies dietician consult.  Objective: Vital signs in last 24 hours: Temp:  [97.5 F (36.4 C)-97.9 F (36.6 C)] 97.5 F (36.4 C) (08/03 0603) Pulse Rate:  [45-60] 54 (08/03 0603) Resp:  [16-18] 16 (08/03 0603) BP: (131-136)/(64-71) 131/71 mmHg (08/03 0603) SpO2:  [97 %-98 %] 97 % (08/03 0603) Last BM Date: 11/15/12  Intake/Output from previous day: 08/02 0701 - 08/03 0700 In: -  Out: 20 [Drains:20] Intake/Output this shift:   PE  General appearance: alert, cooperative, appears stated age and no distress  Resp: clear to auscultation bilaterally  Cardio: regular rate and rhythm, S1, S2 normal, no murmur, click, rub or gallop  GI: +BS, soft, flat and mildly tender around the drain site. Extremities: extremities normal, atraumatic, no cyanosis or edema  Lab Results:   Recent Labs  11/15/12 0520 11/16/12 0435  WBC 6.5 6.2  HGB 12.3* 12.1*  HCT 35.1* 34.2*  PLT 434* 421*   BMET  Recent Labs  11/15/12 0520 11/16/12 0435  NA 137 135  K 3.9 3.7  CL 100 101  CO2 24 25  GLUCOSE 93 102*  BUN 7 7  CREATININE 0.78 0.76  CALCIUM 8.9 9.1    Anti-infectives: Anti-infectives   Start     Dose/Rate Route Frequency Ordered Stop   11/11/12 0300  piperacillin-tazobactam (ZOSYN) IVPB 3.375 g     3.375 g 12.5 mL/hr over 240 Minutes Intravenous Every 8 hours 11/10/12 2127     11/10/12 2100  piperacillin-tazobactam (ZOSYN) IVPB 3.375 g     3.375 g 100 mL/hr over 30 Minutes Intravenous  Once 11/10/12 2056 11/10/12 2146      Assessment/Plan: Sigmoid diverticulitis with abscess(second episode since June)  -s/p percutaneous drain placement by IR 7/29  -40ml/24h output from drain  -advance to low residue diet and change zosyn to cipro.  If he is able to tolerate diet and okay with internal medicine plan to  discharge him today.  Follow up with Dr. Janee Morn for repeat scan and further drain management.   -E coli per culture-sensitive to cipro   LOS: 6 days    Bonner Puna [facility] ANP-BC Pager [phone]  11/16/2012 8:35 AM

## 2012-11-16 NOTE — Progress Notes (Signed)
Agee.  Improved.  Can follow up as outpatient once medically stable.

## 2012-11-17 ENCOUNTER — Telehealth (INDEPENDENT_AMBULATORY_CARE_PROVIDER_SITE_OTHER): Payer: Self-pay | Admitting: General Surgery

## 2012-11-17 ENCOUNTER — Other Ambulatory Visit (INDEPENDENT_AMBULATORY_CARE_PROVIDER_SITE_OTHER): Payer: Self-pay

## 2012-11-17 DIAGNOSIS — Z8719 Personal history of other diseases of the digestive system: Secondary | ICD-10-CM

## 2012-11-17 DIAGNOSIS — K5792 Diverticulitis of intestine, part unspecified, without perforation or abscess without bleeding: Secondary | ICD-10-CM

## 2012-11-17 NOTE — Telephone Encounter (Signed)
LMOM asking pt to return my call.  This so that I can inform him that I set up his CT scan and a f/u appt.  I need to give him the times and instructions of these.

## 2012-11-17 NOTE — Telephone Encounter (Signed)
Pt returned my call and I informed him of his CT appointment w/ [facility] [address] on 11/19/12 at 11:00.  Told him to drink first bottle at 9:15 and second at 10:15 with no solid foods 4 hours prior to test.  Also informed him of his appt on 11/26/12 at 10:30 w/ Dr. Janee Morn.

## 2012-11-19 ENCOUNTER — Ambulatory Visit
Admission: RE | Admit: 2012-11-19 | Discharge: 2012-11-19 | Disposition: A | Discharge status: Home / Self Care | Payer: Medicare Other | Source: Ambulatory Visit | Attending: General Surgery | Admitting: General Surgery

## 2012-11-19 DIAGNOSIS — K5792 Diverticulitis of intestine, part unspecified, without perforation or abscess without bleeding: Secondary | ICD-10-CM

## 2012-11-19 DIAGNOSIS — K5289 Other specified noninfective gastroenteritis and colitis: Secondary | ICD-10-CM | POA: Diagnosis not present

## 2012-11-19 DIAGNOSIS — Z8719 Personal history of other diseases of the digestive system: Secondary | ICD-10-CM

## 2012-11-19 DIAGNOSIS — K5732 Diverticulitis of large intestine without perforation or abscess without bleeding: Secondary | ICD-10-CM | POA: Diagnosis not present

## 2012-11-19 MED ORDER — IOHEXOL 300 MG/ML  SOLN
100.0000 mL | Freq: Once | INTRAMUSCULAR | Status: AC | PRN
  Administered 2012-11-19: 100 mL via INTRAVENOUS

## 2012-11-26 ENCOUNTER — Ambulatory Visit (INDEPENDENT_AMBULATORY_CARE_PROVIDER_SITE_OTHER): Payer: Medicare Other | Admitting: General Surgery

## 2012-11-26 ENCOUNTER — Encounter (INDEPENDENT_AMBULATORY_CARE_PROVIDER_SITE_OTHER): Payer: Self-pay | Admitting: General Surgery

## 2012-11-26 VITALS — BP 150/90 | HR 62 | Temp 97.5°F | Resp 14 | Ht 74.0 in | Wt 151.4 lb

## 2012-11-26 DIAGNOSIS — K5732 Diverticulitis of large intestine without perforation or abscess without bleeding: Secondary | ICD-10-CM | POA: Diagnosis not present

## 2012-11-26 DIAGNOSIS — K5720 Diverticulitis of large intestine with perforation and abscess without bleeding: Secondary | ICD-10-CM

## 2012-11-26 NOTE — Patient Instructions (Signed)
Continue low fiber diet until your next office visit.

## 2012-11-26 NOTE — Progress Notes (Signed)
Subjective:     Patient ID: [name], male   DOB: [birth year], 72 y.o.   MRN: [id number]  HPI Patient presents for followup status post admission for perforated sigmoid diverticulitis with abscess. He underwent percutaneous drain placement. He is home on antibiotics. He had a followup CT scan several days ago which showed complete resolution of his abscess. His abdominal pain is improved but not totally resolved. He is staying on a low fiber diet.  Review of Systems     Objective:   Physical Exam  Constitutional: He appears well-developed and well-nourished. No distress.  Cardiovascular: Normal rate and normal heart sounds.   Pulmonary/Chest: Effort normal and breath sounds normal.  Abdominal:  Soft, and no left lower quadrant or other tenderness, drain output was less than 10 cc a day. Drain was removed without difficulty and a small bandage was applied.       Assessment:     Sigmoid diverticulitis with abscess status post percutaneous drain now with complete resolution of his abscess    Plan:     Complete course of antibiotics. Return next month. Call if any symptoms develop in the interim.

## 2012-11-28 ENCOUNTER — Ambulatory Visit (INDEPENDENT_AMBULATORY_CARE_PROVIDER_SITE_OTHER): Payer: Medicare Other | Admitting: Surgery

## 2012-11-28 ENCOUNTER — Encounter (INDEPENDENT_AMBULATORY_CARE_PROVIDER_SITE_OTHER): Payer: Self-pay | Admitting: Surgery

## 2012-11-28 VITALS — BP 142/80 | HR 64 | Temp 98.6°F | Resp 14 | Ht 74.0 in | Wt <= 1120 oz

## 2012-11-28 DIAGNOSIS — K5720 Diverticulitis of large intestine with perforation and abscess without bleeding: Secondary | ICD-10-CM

## 2012-11-28 DIAGNOSIS — L02219 Cutaneous abscess of trunk, unspecified: Secondary | ICD-10-CM | POA: Diagnosis not present

## 2012-11-28 DIAGNOSIS — K5732 Diverticulitis of large intestine without perforation or abscess without bleeding: Secondary | ICD-10-CM | POA: Diagnosis not present

## 2012-11-28 DIAGNOSIS — L02211 Cutaneous abscess of abdominal wall: Secondary | ICD-10-CM

## 2012-11-28 DIAGNOSIS — L03319 Cellulitis of trunk, unspecified: Secondary | ICD-10-CM | POA: Diagnosis not present

## 2012-11-28 HISTORY — DX: Cutaneous abscess of abdominal wall: L02.211

## 2012-11-28 NOTE — Progress Notes (Signed)
[facility]  Chief Complaint  Patient presents with  . Follow-up    reck drain site - recent diverticular abscess - Dr. Violeta Gelinas    HISTORY: Patient is a 72 year old male who underwent percutaneous drainage of diverticular abscess and treatment with antibiotics for acute diverticulitis as an inpatient at [facility]. Patient underwent CT scan last week which showed resolution of his abscess. Percutaneous drain was removed 2 days ago. Patient continues to take oral Cipro. Patient has developed pain and swelling at the drain site in the left lower quadrant of the abdominal wall. He presents today for evaluation.  EXAM: Abdomen is soft without distention. There is a fluctuant mass in the left lower quadrant. The drain site is epithelialized.  Skin is prepared with Betadine. Local anesthetic is infiltrated. A 1.5 cm incision is made into the roof of the abscess cavity. Purulent fluid is evacuated. Quarter-inch iodoform gauze packing is placed into the cavity and dressed with dry gauze dressing.  IMPRESSION: Abdominal wall abscess at site of percutaneous diverticular abscess drainage  PLAN: Patient will continue on Cipro orally. Wound care instructions are given. Patient will return in 3 days for wound check and removal of packing.  Velora Heckler, MD, [facility] Office: [phone]   Visit Diagnoses: 1. Colonic diverticular abscess   2. Abscess of abdominal wall

## 2012-11-28 NOTE — Patient Instructions (Signed)
Change external gauze dressing as needed. Remove wet gauze after showers and replaced with dry gauze. Leave iodoform gauze packing in place if possible.  Continue with oral Cipro as prescribed. Return in 3 days for wound check.  Velora Heckler, MD, [facility] Office: [phone]

## 2012-12-01 ENCOUNTER — Ambulatory Visit (INDEPENDENT_AMBULATORY_CARE_PROVIDER_SITE_OTHER): Payer: Medicare Other | Admitting: General Surgery

## 2012-12-01 ENCOUNTER — Encounter (INDEPENDENT_AMBULATORY_CARE_PROVIDER_SITE_OTHER): Payer: Self-pay | Admitting: General Surgery

## 2012-12-01 VITALS — BP 150/76 | HR 68 | Resp 14 | Ht 74.0 in | Wt 151.2 lb

## 2012-12-01 DIAGNOSIS — K5732 Diverticulitis of large intestine without perforation or abscess without bleeding: Secondary | ICD-10-CM | POA: Diagnosis not present

## 2012-12-01 DIAGNOSIS — K5792 Diverticulitis of intestine, part unspecified, without perforation or abscess without bleeding: Secondary | ICD-10-CM

## 2012-12-01 NOTE — Progress Notes (Signed)
Subjective:     Patient ID: [name], male   DOB: [birth year], 72 y.o.   MRN: [id number]  HPI This patient follows up status post perforated diverticulitis with abscess. He had a drain placed and this has subsequently been removed. He had an infection at the site of his drain and he had I&D of this last week by Dr. Gerrit Friends. He says he feels better since it has been drained but still continues to have some foul drainage. He comes in today because the rhythm that was left in the hole came out.   Review of Systems     Objective:   Physical Exam No acute distress and nontoxic-appearing His abdomen is soft and nontender exam he has some purulent drainage from a small incision and drainage site in his left lower quadrant. He has some induration around this but no erythema. I probed the wound and irrigated this with sterile saline solution and packed the wound with 1/4 inch Nu Gauze.    Assessment:     Diverticulitis with abscess I think that his abscess is drained.  NO abdominal tenderness.  I recommended that he pack the wound daily until healed.   He only has 2 days remaining of his Cipro.  He will finish this and then see how he does symptomatically. If he continues to be symptom free, then he can discuss with his surgeon Dr. Janee Morn elective sigmoidectomy, but if symptoms return, he may need surgery sooner.  I also explained that he may develop a fistula through the drain site but currently he only has purulent drainage and I do not see signs of this now.        Plan:     Wound packing and repeat visit in about 2 weeks.

## 2012-12-03 ENCOUNTER — Encounter: Payer: Self-pay | Admitting: Cardiology

## 2012-12-03 ENCOUNTER — Ambulatory Visit (INDEPENDENT_AMBULATORY_CARE_PROVIDER_SITE_OTHER): Payer: Medicare Other | Admitting: Cardiology

## 2012-12-03 ENCOUNTER — Ambulatory Visit: Payer: Medicare Other | Admitting: Cardiology

## 2012-12-03 VITALS — BP 132/71 | HR 55 | Ht 74.0 in | Wt 148.0 lb

## 2012-12-03 DIAGNOSIS — G458 Other transient cerebral ischemic attacks and related syndromes: Secondary | ICD-10-CM | POA: Diagnosis not present

## 2012-12-03 DIAGNOSIS — I2510 Atherosclerotic heart disease of native coronary artery without angina pectoris: Secondary | ICD-10-CM

## 2012-12-03 DIAGNOSIS — E785 Hyperlipidemia, unspecified: Secondary | ICD-10-CM

## 2012-12-03 MED ORDER — SIMVASTATIN 40 MG PO TABS: 40.0000 mg | ORAL_TABLET | Freq: Every evening | ORAL | Status: DC

## 2012-12-03 MED ORDER — METOPROLOL TARTRATE 50 MG PO TABS: 50.0000 mg | ORAL_TABLET | Freq: Two times a day (BID) | ORAL | Status: DC

## 2012-12-03 NOTE — Patient Instructions (Addendum)
Your physician wants you to follow-up in: 1 year with Dr McLean. (August 2015).  You will receive a reminder letter in the mail two months in advance. If you don't receive a letter, please call our office to schedule the follow-up appointment.  

## 2012-12-04 NOTE — Progress Notes (Signed)
Patient ID: [name], male   DOB: May 23, 1940, 72 y.o.   MRN: [id number] PCP: Dr. Clovis Riley in [place]  72 yo with history of subclavian steal syndrome and diabetes presents for cardiology followup.  He had a cath in 2002 with minimal disease.  He has left subclavian stenosis with evidence for steal by doppler imaging, last dopplers done in 8/13.  He does not have symptoms of arm claudication or cerebral ischemia.  No chest pain, no exertional dyspnea.  He is active, walking 2+ miles/day.  No lightheadedness or syncope.  Main issue recently has been a severe episode of diverticulitis back in 7/14. He required percutaneous drainage of an abscess and may end up needing surgery.   Labs (6/13): K 4.7, creatinine 0.8, hgbA1c 6.3, LDL 41, HDL 46 Labs (6/14): K 4.4, creatinine 0.91, LDL 50, HDL 41  ECG: NSR, anteroseptal Qs  PMH: 1. DM 2. HTN 3. CAD: Mild on cath in 2002.  Normal myoview in 2010.  4. Hyperlipidemia 5. Subclavian stenosis on left with evidence for steal, managed medically. 8/13 carotid dopplers with normal bilateral ICAs but left subclavian stenosis with evidence for subclavian steal.  6. Mild bradycardia 7. TURP 1/13 8. Bladder cancer  SH: Nonsmoker.  Retired, lives in [place].  Married. Smokes pipe 3-4 times a day.   FH: No premature CAD.    Current Outpatient Prescriptions  Medication Sig Dispense Refill  . acidophilus (RISAQUAD) CAPS Take 2 capsules by mouth daily.  30 capsule  1  . aspirin 81 MG tablet Take 81 mg by mouth daily.      Marland Kitchen BIOTIN PO Take 1 tablet by mouth daily.       . ciprofloxacin (CIPRO) 500 MG tablet Take 1 tablet (500 mg total) by mouth 2 (two) times daily.  28 tablet  0  . Cyanocobalamin (VITAMIN B 12 PO) Take by mouth as directed.      . dicyclomine (BENTYL) 20 MG tablet Take 20 mg by mouth 4 (four) times daily as needed (for abdominal pain).       . fish oil-omega-3 fatty acids 1000 MG capsule Take 1 g by mouth daily.       Marland Kitchen glipiZIDE  (GLUCOTROL) 5 MG tablet Take 2.5 mg by mouth daily before breakfast.       . metoprolol (LOPRESSOR) 50 MG tablet Take 1 tablet (50 mg total) by mouth 2 (two) times daily.  180 tablet  3  . Multiple Vitamin (MULTIVITAMIN) tablet Take 1 tablet by mouth daily.      . psyllium (METAMUCIL) 58.6 % powder Take 1 packet by mouth daily.      . simvastatin (ZOCOR) 40 MG tablet Take 1 tablet (40 mg total) by mouth every evening.  90 tablet  3   No current facility-administered medications for this visit.    BP 132/71  Pulse 55  Ht 6\' 2"  (1.88 m)  Wt 67.132 kg (148 lb)  BMI 18.99 kg/m2 General: NAD Neck: No JVD, no thyromegaly or thyroid nodule.  Lungs: Clear to auscultation bilaterally with normal respiratory effort. CV: Nondisplaced PMI.  Heart regular S1/S2, no S3/S4, no murmur.  No peripheral edema.  Murmur across upper left precordium, left shoulder, left side of the neck.  Normal pedal pulses.  Cannot feel left radial pulse.  Abdomen: Soft, nontender, no hepatosplenomegaly, no distention.  Neurologic: Alert and oriented x 3.  Psych: Normal affect. Extremities: No clubbing or cyanosis.   Assessment/Plan: 1. Left subclavian stenosis: With subclavian steal  physiology by dopplers.  No arm claudication, no stroke-like symptoms.  Continue to manage medically.  Would repeat dopplers in 8/15.  2. Hyperlipidemia: Good lipids when checked recently.  Continue statin.  3. Smoking: Still smokes pipe.  I told him again he ought to quit.   Marca Ancona 12/04/2012

## 2012-12-06 ENCOUNTER — Telehealth (INDEPENDENT_AMBULATORY_CARE_PROVIDER_SITE_OTHER): Payer: Self-pay | Admitting: Surgery

## 2012-12-06 ENCOUNTER — Other Ambulatory Visit (INDEPENDENT_AMBULATORY_CARE_PROVIDER_SITE_OTHER): Payer: Self-pay | Admitting: Surgery

## 2012-12-06 NOTE — Telephone Encounter (Signed)
Telephone call from pt daughter saying wound at drain site red.  It was i and d in office this week. Told to come to ED for evaluation. Pt not sure he wants to do that. Encouraged him to do so.

## 2012-12-08 ENCOUNTER — Ambulatory Visit (INDEPENDENT_AMBULATORY_CARE_PROVIDER_SITE_OTHER): Payer: Medicare Other | Admitting: Surgery

## 2012-12-08 ENCOUNTER — Telehealth (INDEPENDENT_AMBULATORY_CARE_PROVIDER_SITE_OTHER): Payer: Self-pay | Admitting: General Surgery

## 2012-12-08 ENCOUNTER — Encounter (INDEPENDENT_AMBULATORY_CARE_PROVIDER_SITE_OTHER): Payer: Self-pay | Admitting: Surgery

## 2012-12-08 VITALS — BP 118/70 | HR 68 | Resp 14 | Ht 74.0 in | Wt 149.2 lb

## 2012-12-08 DIAGNOSIS — R300 Dysuria: Secondary | ICD-10-CM | POA: Diagnosis not present

## 2012-12-08 DIAGNOSIS — K5732 Diverticulitis of large intestine without perforation or abscess without bleeding: Secondary | ICD-10-CM

## 2012-12-08 DIAGNOSIS — N200 Calculus of kidney: Secondary | ICD-10-CM | POA: Diagnosis not present

## 2012-12-08 DIAGNOSIS — K5792 Diverticulitis of intestine, part unspecified, without perforation or abscess without bleeding: Secondary | ICD-10-CM | POA: Insufficient documentation

## 2012-12-08 DIAGNOSIS — K5720 Diverticulitis of large intestine with perforation and abscess without bleeding: Secondary | ICD-10-CM

## 2012-12-08 MED ORDER — METRONIDAZOLE 500 MG PO TABS: 500.0000 mg | ORAL_TABLET | Freq: Three times a day (TID) | ORAL | Status: DC

## 2012-12-08 MED ORDER — AMOXICILLIN-POT CLAVULANATE 875-125 MG PO TABS: 1.0000 | ORAL_TABLET | Freq: Two times a day (BID) | ORAL | Status: DC

## 2012-12-08 NOTE — Telephone Encounter (Signed)
Made follow up call to pt/wife and related Dr. Janee Morn wants him seen by urologist ASAP.  Wife states he is on his way to Dr. Jeralyn Ruths office now.

## 2012-12-08 NOTE — Progress Notes (Signed)
Subjective:     Patient ID: [name], male   DOB: 02/05/1941, 72 y.o.   MRN: [id number]  HPI  [name]  [birth year] [id number]  Patient Care Team: Gabriel Cirri as PCP - General (Family Medicine) Anner Crete, MD as Consulting Physician (Urology) Vertell Novak., MD as Consulting Physician (Gastroenterology)  This patient is a 72 y.o.male who presents today for surgical evaluation at the request of the patient's daughter.   Reason for visit: Persistent draining from former drain site in the setting of diverticulitis.  The patient is a pleasant but feisty elderly gentleman.  He has had attacks of abdominal pain presumed to be diverticulitis for decades.  He has never had a colonoscopy.  Apparently, his sister died of a perforation on colonoscopy that was diagnosed to light.  Diet wake [employer].  Therefore, he has refused colonoscopy.  Patient had abdominal pain.  Was admitted with a pelvic abscess presumed to be do to diverticulitis.  Surgical consultation made.  Percutaneous drain done.  Stabilized and was on home with a drain.  Followup CAT scan showed resolution of the abscess.  Drain was removed.  However, the patient began to have persistent drainage from the drain site of pus.  Came into our office urgently.  Was drained and packed.  Seen again.  Was given oral Cipro antibiotics.  That is usually the antibiotic treated for the diverticulitis that has worked in the past.  At least 7-10 day courses.  In some ways he feels better.  His appetite has not been great but a little better.  More energy since the admission the hospital.  He has good days and bad days.  However, he began to have low-grade fevers.  Began having persistent i abdominal drainage.  The family called over the weekend.  Despite our recommendations, they did not want to come to the emergency room.  Daughter called [facility] concerned.  Wished him to be seen today.  Wished to have the initial surgeon see  the patient, but Dr. Janee Morn was not available due to his trauma commitments.  Therefore, I was asked to assume care of the patient.  Patient Active Problem List   Diagnosis Date Noted  . Diverticulitis - recurrent 12/08/2012  . Abscess of abdominal wall 11/28/2012  . Colonic diverticular abscess, possible fistula 11/10/2012  . Diabetes mellitus 11/10/2012  . Smoking 11/25/2011  . Pre-operative clearance 05/08/2011  . Bradycardia 11/27/2010  . CAD (coronary artery disease) 11/27/2010  . Hyperlipidemia 11/27/2010  . HTN (hypertension) 11/27/2010  . Subclavian steal syndrome 11/27/2010    Past Medical History  Diagnosis Date  . Diabetes mellitus   . Hyperlipidemia     Is on statin therapy  . Diverticulitis 2841-3244    "all my life"  . Normal nuclear stress test 2010    EF 63% with no ischemia  . Subclavian steal syndrome     left; evaluated by [name]; managed medically  . Skin cancer   . Bladder cancer   . Hypertension     LOV  Dr Shirlee Latch with clearance 05/08/11 and EKG  in [facility]   . CAD (coronary artery disease)     Mild per cath in 2002/last nuclear stress per note  2010  . Colonoscopy refused     Sister died from delayed Dx of post-colonoscopy     Past Surgical History  Procedure Laterality Date  . Cardiac catheterization  2002    He had a which showed  mild coronary atherosclerosis and normal left ventricular function.  . Rotator cuff repair      right  . Tonsillectomy    . Appendectomy    . Cystoscopy with injection      cystoscopy with BCG injections  . Transurethral resection of prostate  05/17/2011    Procedure: TRANSURETHRAL RESECTION OF THE PROSTATE WITH GYRUS INSTRUMENTS;  Surgeon: Anner Crete, MD;  Location: [facility];  Service: Urology;  Laterality: N/A;    History   Social History  . Marital Status: Married    Spouse Name: N/A    Number of Children: N/A  . Years of Education: N/A   Occupational History  . Not on file.   Social History Main  Topics  . Smoking status: Former Smoker -- 55 years    Types: Pipe    Quit date: 05/14/1955  . Smokeless tobacco: Former Neurosurgeon  . Alcohol Use: No  . Drug Use: No  . Sexual Activity: Yes   Other Topics Concern  . Not on file   Social History Narrative  . No narrative on file    Family History  Problem Relation Age of Onset  . Hypertension Mother   . Lung cancer Mother     Current Outpatient Prescriptions  Medication Sig Dispense Refill  . acidophilus (RISAQUAD) CAPS Take 2 capsules by mouth daily.  30 capsule  1  . aspirin 81 MG tablet Take 81 mg by mouth daily.      Marland Kitchen BIOTIN PO Take 1 tablet by mouth daily.       . Cyanocobalamin (VITAMIN B 12 PO) Take by mouth as directed.      . dicyclomine (BENTYL) 20 MG tablet Take 20 mg by mouth 4 (four) times daily as needed (for abdominal pain).       . fish oil-omega-3 fatty acids 1000 MG capsule Take 1 g by mouth daily.       Marland Kitchen glipiZIDE (GLUCOTROL) 5 MG tablet Take 2.5 mg by mouth daily before breakfast.       . metoprolol (LOPRESSOR) 50 MG tablet Take 1 tablet (50 mg total) by mouth 2 (two) times daily.  180 tablet  3  . Multiple Vitamin (MULTIVITAMIN) tablet Take 1 tablet by mouth daily.      . psyllium (METAMUCIL) 58.6 % powder Take 1 packet by mouth daily.      . simvastatin (ZOCOR) 40 MG tablet Take 1 tablet (40 mg total) by mouth every evening.  90 tablet  3  . amoxicillin-clavulanate (AUGMENTIN) 875-125 MG per tablet Take 1 tablet by mouth 2 (two) times daily.  28 tablet  2  . metroNIDAZOLE (FLAGYL) 500 MG tablet Take 1 tablet (500 mg total) by mouth 3 (three) times daily.  50 tablet  1   No current facility-administered medications for this visit.     No Known Allergies  BP 118/70  Pulse 68  Resp 14  Ht 6\' 2"  (1.88 m)  Wt 149 lb 3.2 oz (67.677 kg)  BMI 19.15 kg/m2  Ct Abdomen Pelvis W Contrast  11/19/2012   *RADIOLOGY REPORT*  Clinical Data: Follow-up diverticular abscess.  CT ABDOMEN AND PELVIS WITH CONTRAST   Technique:  Multidetector CT imaging of the abdomen and pelvis was performed following the standard protocol during bolus administration of intravenous contrast.  Contrast: OMNIPAQUE IOHEXOL 300 MG/ML  SOLN  Comparison: 11/10/2012.  Findings: The lung bases are stable.  No acute findings.  The solid abdominal organs are stable.  The  gallbladder is normal. No common bile duct dilatation.  Stable atherosclerotic changes involving the aorta and branch vessels.  In the pelvis there is a left-sided drainage catheter with complete or near complete resolution of the left-sided diverticular abscess. The colon demonstrates some residual inflammation and wall thickening with diffuse diverticulosis.  I do not see any discrete residual fluid collection, just mild inflammatory changes in the mesocolon.  IMPRESSION: Resolution of left-sided complex diverticular abscess.  Minimal residual post inflammatory changes but no definite fluid collection.  Mild residual inflammation of the sigmoid colon.   Original Report Authenticated By: Rudie Meyer, M.D.   Ct Image Guided Fluid Drain By Catheter  11/11/2012   *RADIOLOGY REPORT*  Indication: Perforated diverticulitis, now with abscess within the left lower abdomen.  CT GUIDED LEFT LOWER ABDOMINAL DRAINAGE CATHETER PLACEMENT  Comparison: CT abdomen pelvis - 11/10/2012  Medications: Fentanyl 50 mcg IV; Versed 2 mg IV  Total Moderate Sedation time: 20 minutes  Contrast: None  Complications: None immediate  Technique / Findings:  Informed written consent was obtained from the patient after a discussion of the risks, benefits and alternatives to treatment. The patient was placed supine on the CT gantry and a pre procedural CT was performed re-demonstrating the known abscess/fluid collection within the left lower abdominal quadrant.  The procedure was planned.   A timeout was performed prior to the initiation of the procedure.  The left lower abdomen was prepped and draped in the  usual sterile fashion.   The overlying soft tissues were anesthetized with 1% lidocaine with epinephrine.  Appropriate trajectory was planned with the use of a 22 gauge spinal needle.  An 18 gauge trocar needle was advanced into the abscess/fluid collection and a short Amplatz super stiff wire was coiled within the collection. Appropriate positioning was confirmed with a limited CT scan.  The tract was serially dilated allowing placement of a 10 [place] all- purpose drainage catheter.  Appropriate positioning was confirmed with a limited postprocedural CT scan.  40 ml of purulent fluid was aspirated.  The tube was connected to a drainage bag and sutured in place.  A dressing was placed.  The patient tolerated the procedure well without immediate post procedural complication.  Impression:  Successful CT guided placement of a 10 French all purpose drain catheter into the location with aspiration of 40 mL of purulent fluid.  Samples were sent to the laboratory as requested by the ordering clinical team.   Original Report Authenticated By: Tacey Ruiz, MD     Review of Systems  Constitutional: Positive for unexpected weight change. Negative for fever, chills and diaphoresis.  HENT: Negative for nosebleeds, sore throat, facial swelling, mouth sores, trouble swallowing and ear discharge.   Eyes: Negative for photophobia, discharge and visual disturbance.  Respiratory: Negative for choking, chest tightness, shortness of breath and stridor.   Cardiovascular: Negative for chest pain and palpitations.  Gastrointestinal: Positive for abdominal pain and diarrhea. Negative for nausea, vomiting, constipation, blood in stool, abdominal distention, anal bleeding and rectal pain.  Endocrine: Negative for cold intolerance and heat intolerance.  Genitourinary: Positive for dysuria. Negative for urgency, hematuria, discharge, difficulty urinating, penile pain and testicular pain.  Musculoskeletal: Negative for myalgias,  back pain, arthralgias and gait problem.  Skin: Positive for wound. Negative for color change, pallor and rash.  Allergic/Immunologic: Negative for environmental allergies and food allergies.  Neurological: Negative for dizziness, speech difficulty, weakness, numbness and headaches.  Hematological: Negative for adenopathy. Does not bruise/bleed easily.  Psychiatric/Behavioral: Negative for hallucinations, confusion and agitation. The patient is not nervous/anxious.        Objective:   Physical Exam  Constitutional: He is oriented to person, place, and time. He appears well-developed. He appears cachectic. He is cooperative.  Non-toxic appearance. He does not have a sickly appearance. He does not appear ill. No distress.  HENT:  Head: Normocephalic.  Mouth/Throat: Oropharynx is clear and moist. No oropharyngeal exudate.  Eyes: Conjunctivae and EOM are normal. Pupils are equal, round, and reactive to light. No scleral icterus.  Neck: Normal range of motion. Neck supple. No tracheal deviation present.  Cardiovascular: Normal rate, regular rhythm and intact distal pulses.   Pulmonary/Chest: Effort normal and breath sounds normal. No respiratory distress.  Abdominal: Soft. He exhibits no distension. There is no tenderness. No hernia. Hernia confirmed negative in the ventral area, confirmed negative in the right inguinal area and confirmed negative in the left inguinal area.    Musculoskeletal: Normal range of motion. He exhibits no tenderness.  Lymphadenopathy:    He has no cervical adenopathy.       Right: No inguinal adenopathy present.       Left: No inguinal adenopathy present.  Neurological: He is alert and oriented to person, place, and time. No cranial nerve deficit. He exhibits normal muscle tone. Coordination normal.  Skin: Skin is warm and dry. No rash noted. He is not diaphoretic. No erythema. No pallor.  Psychiatric: He has a normal mood and affect. His speech is normal and  behavior is normal. Judgment and thought content normal. Cognition and memory are normal.  Joking, pleasant.  Deferential       Assessment:     Persistent sigmoid diverticulitis with recurrent abscess, possible fistula.     Plan:     I spent some time reviewing the chart and discussing with the patient, his wife, and his daughter.  I discussed with [name], M.D. with Interventional radiology.  After re-explanations, the patient seemed to have the attitude "whatever you think is right, doc..."  The patient's wife and daughter seemed comfortable with me assuming surgical care at this time.  I think this abscess needs to get under better control.  Therefore, restart antibiotics.  Switch to Augmentin/Flagyl.  Two week minimum.  Possibly longer.  Repeat CT scan with oral and IV contrast.  Possible fistulogram.  Seton drain to be placed and the fluid collection near the bladder/sigmoid colon.  If that can be controlled, hopefully can avoid surgery.  I would get a drain study before removing the drain next time.  If that fails, admit with IV antibiotics.  If still fails, colectomy with colostomy.  If patient improves, set up elective colonoscopy to rule out cancer since she has never had a colonoscopy done.  Expect 4-6 weeks after diverticulitis under control but defer to Dr. Randa Evens with [facility].    Then plan laparoscopically assisted sigmoid colectomy with anastomosis provided that is the only problem area seen on endoscopy:  The anatomy & physiology of the digestive tract was discussed.  The pathophysiology was discussed.  Natural history risks without surgery was discussed.   I feel the risks of no intervention will lead to serious problems that outweigh the operative risks; therefore, I recommended a partial colectomy to remove the pathology.  Laparoscopic & open techniques were discussed.   Risks such as bleeding, infection, abscess, leak, reoperation, possible ostomy, hernia, heart  attack, death, and other risks were discussed.  I noted a  good likelihood this will help address the problem.   Goals of post-operative recovery were discussed as well.  We will work to minimize complications.  An educational handout on the pathology was given as well.  Questions were answered.  The patient expresses understanding & wishes to proceed with surgery.

## 2012-12-08 NOTE — Patient Instructions (Addendum)
Start new oral antibiotics to help treat the diverticulitis/recurrent abscess.  Get CAT scan, possible fistulogram, to get drain replaced into pelvis abscess if possible.  At some point, he will need a colonoscopy to rule out a cancer or other problems.  This needs to happen four weeks after completion of antibiotics.  At some point, he will need surgery to remove your colon that contains the diverticulitis.  We will try to avoid an emergency resection with colostomy and do this in one stage.  This depends on the infection/abscess/fistula getting under control.  Abscess An abscess is an infected area that contains a collection of pus and debris.It can occur in almost any part of the body. An abscess is also known as a furuncle or boil. CAUSES  An abscess occurs when tissue gets infected. This can occur from blockage of oil or sweat glands, infection of hair follicles, or a minor injury to the skin. As the body tries to fight the infection, pus collects in the area and creates pressure under the skin. This pressure causes pain. People with weakened immune systems have difficulty fighting infections and get certain abscesses more often.  SYMPTOMS Usually an abscess develops on the skin and becomes a painful mass that is red, warm, and tender. If the abscess forms under the skin, you may feel a moveable soft area under the skin. Some abscesses break open (rupture) on their own, but most will continue to get worse without care. The infection can spread deeper into the body and eventually into the bloodstream, causing you to feel ill.  DIAGNOSIS  Your caregiver will take your medical history and perform a physical exam. A sample of fluid may also be taken from the abscess to determine what is causing your infection. TREATMENT  Your caregiver may prescribe antibiotic medicines to fight the infection. However, taking antibiotics alone usually does not cure an abscess. Your caregiver may need to make a  small cut (incision) in the abscess to drain the pus. In some cases, gauze is packed into the abscess to reduce pain and to continue draining the area. HOME CARE INSTRUCTIONS   Only take over-the-counter or prescription medicines for pain, discomfort, or fever as directed by your caregiver.  If you were prescribed antibiotics, take them as directed. Finish them even if you start to feel better.  If gauze is used, follow your caregiver's directions for changing the gauze.  To avoid spreading the infection:  Keep your draining abscess covered with a bandage.  Wash your hands well.  Do not share personal care items, towels, or whirlpools with others.  Avoid skin contact with others.  Keep your skin and clothes clean around the abscess.  Keep all follow-up appointments as directed by your caregiver. SEEK MEDICAL CARE IF:   You have increased pain, swelling, redness, fluid drainage, or bleeding.  You have muscle aches, chills, or a general ill feeling.  You have a fever. MAKE SURE YOU:   Understand these instructions.  Will watch your condition.  Will get help right away if you are not doing well or get worse. Document Released: 01/10/2005 Document Revised: 10/02/2011 Document Reviewed: 06/15/2011 [facility] Patient Information 2014 [place], [place].  Diverticulitis A diverticulum is a small pouch or sac on the colon. Diverticulosis is the presence of these diverticula on the colon. Diverticulitis is the irritation (inflammation) or infection of diverticula. CAUSES  The colon and its diverticula contain bacteria. If food particles block the tiny opening to a diverticulum, the bacteria  inside can grow and cause an increase in pressure. This leads to infection and inflammation and is called diverticulitis. SYMPTOMS   Abdominal pain and tenderness. Usually, the pain is located on the left side of your abdomen. However, it could be located elsewhere.  Fever.  Bloating.  Feeling  sick to your stomach (nausea).  Throwing up (vomiting).  Abnormal stools. DIAGNOSIS  Your caregiver will take a history and perform a physical exam. Since many things can cause abdominal pain, other tests may be necessary. Tests may include:  Blood tests.  Urine tests.  X-ray of the abdomen.  CT scan of the abdomen. Sometimes, surgery is needed to determine if diverticulitis or other conditions are causing your symptoms. TREATMENT  Most of the time, you can be treated without surgery. Treatment includes:  Resting the bowels by only having liquids for a few days. As you improve, you will need to eat a low-fiber diet.  Intravenous (IV) fluids if you are losing body fluids (dehydrated).  Antibiotic medicines that treat infections may be given.  Pain and nausea medicine, if needed.  Surgery if the inflamed diverticulum has burst. HOME CARE INSTRUCTIONS   Try a clear liquid diet (broth, tea, or water for as long as directed by your caregiver). You may then gradually begin a low-fiber diet as tolerated.  A low-fiber diet is a diet with less than 10 grams of fiber. Choose the foods below to reduce fiber in the diet:  White breads, cereals, rice, and pasta.  Cooked fruits and vegetables or soft fresh fruits and vegetables without the skin.  Ground or well-cooked tender beef, ham, veal, lamb, pork, or poultry.  Eggs and seafood.  After your diverticulitis symptoms have improved, your caregiver may put you on a high-fiber diet. A high-fiber diet includes 14 grams of fiber for every 1000 calories consumed. For a standard 2000 calorie diet, you would need 28 grams of fiber. Follow these diet guidelines to help you increase the fiber in your diet. It is important to slowly increase the amount fiber in your diet to avoid gas, constipation, and bloating.  Choose whole-grain breads, cereals, pasta, and brown rice.  Choose fresh fruits and vegetables with the skin on. Do not overcook  vegetables because the more vegetables are cooked, the more fiber is lost.  Choose more nuts, seeds, legumes, dried peas, beans, and lentils.  Look for food products that have greater than 3 grams of fiber per serving on the Nutrition Facts label.  Take all medicine as directed by your caregiver.  If your caregiver has given you a follow-up appointment, it is very important that you go. Not going could result in lasting (chronic) or permanent injury, pain, and disability. If there is any problem keeping the appointment, call to reschedule. SEEK MEDICAL CARE IF:   Your pain does not improve.  You have a hard time advancing your diet beyond clear liquids.  Your bowel movements do not return to normal. SEEK IMMEDIATE MEDICAL CARE IF:   Your pain becomes worse.  You have an oral temperature above 102 F (38.9 C), not controlled by medicine.  You have repeated vomiting.  You have bloody or black, tarry stools.  Symptoms that brought you to your caregiver become worse or are not getting better. MAKE SURE YOU:   Understand these instructions.  Will watch your condition.  Will get help right away if you are not doing well or get worse. Document Released: 01/10/2005 Document Revised: 06/25/2011 Document Reviewed: 05/08/2010  [employer] Patient Information 2014 [place], [place].  ABDOMINAL SURGERY: POST OP INSTRUCTIONS  1. DIET: Follow a light bland diet the first 24 hours after arrival home, such as soup, liquids, crackers, etc.  Be sure to include lots of fluids daily.  Avoid fast food or heavy meals as your are more likely to get nauseated.  Eat a low fat the next few days after surgery.   2. Take your usually prescribed home medications unless otherwise directed. 3. PAIN CONTROL: a. Pain is best controlled by a usual combination of three different methods TOGETHER: i. Ice/Heat ii. Over the counter pain medication iii. Prescription pain medication b. Most patients will experience  some swelling and bruising around the incisions.  Ice packs or heating pads (30-60 minutes up to 6 times a day) will help. Use ice for the first few days to help decrease swelling and bruising, then switch to heat to help relax tight/sore spots and speed recovery.  Some people prefer to use ice alone, heat alone, alternating between ice & heat.  Experiment to what works for you.  Swelling and bruising can take several weeks to resolve.   c. It is helpful to take an over-the-counter pain medication regularly for the first few weeks.  Choose one of the following that works best for you: i. Naproxen (Aleve, etc)  Two 220mg  tabs twice a day ii. Ibuprofen (Advil, etc) Three 200mg  tabs four times a day (every meal & bedtime) iii. Acetaminophen (Tylenol, etc) 500-650mg  four times a day (every meal & bedtime) d. A  prescription for pain medication (such as oxycodone, hydrocodone, etc) should be given to you upon discharge.  Take your pain medication as prescribed.  i. If you are having problems/concerns with the prescription medicine (does not control pain, nausea, vomiting, rash, itching, etc), please call us [phone] to see if we need to switch you to a different pain medicine that will work better for you and/or control your side effect better. ii. If you need a refill on your pain medication, please contact your pharmacy.  They will contact our office to request authorization. Prescriptions will not be filled after 5 pm or on week-ends. 4. Avoid getting constipated.  Between the surgery and the pain medications, it is common to experience some constipation.  Increasing fluid intake and taking a fiber supplement (such as Metamucil, Citrucel, FiberCon, MiraLax, etc) 1-2 times a day regularly will usually help prevent this problem from occurring.  A mild laxative (prune juice, Milk of Magnesia, MiraLax, etc) should be taken according to package directions if there are no bowel movements after 48 hours.    5. Watch out for diarrhea.  If you have many loose bowel movements, simplify your diet to bland foods & liquids for a few days.  Stop any stool softeners and decrease your fiber supplement.  Switching to mild anti-diarrheal medications (Kayopectate, Pepto Bismol) can help.  If this worsens or does not improve, please call us. 6. Wash / shower every day.  You may shower over the incision / wound.  Avoid baths until the skin is fully healed.  Continue to shower over incision(s) after the dressing is off. 7. Remove your waterproof bandages 5 days after surgery.  You may leave the incision open to air.  You may replace a dressing/Band-Aid to cover the incision for comfort if you wish. 8. ACTIVITIES as tolerated:   a. You may resume regular (light) daily activities beginning the next day-such as daily self-care, walking, climbing stairs-gradually  increasing activities as tolerated.  If you can walk 30 minutes without difficulty, it is safe to try more intense activity such as jogging, treadmill, bicycling, low-impact aerobics, swimming, etc. b. Save the most intensive and strenuous activity for last such as sit-ups, heavy lifting, contact sports, etc  Refrain from any heavy lifting or straining until you are off narcotics for pain control.   c. DO NOT PUSH THROUGH PAIN.  Let pain be your guide: If it hurts to do something, don't do it.  Pain is your body warning you to avoid that activity for another week until the pain goes down. d. You may drive when you are no longer taking prescription pain medication, you can comfortably wear a seatbelt, and you can safely maneuver your car and apply brakes. e. Bonita Quin may have sexual intercourse when it is comfortable.  9. FOLLOW UP in our office a. Please call [facility] at [phone] to set up an appointment to see your surgeon in the office for a follow-up appointment approximately 1-2 weeks after your surgery. b. Make sure that you call for this appointment the day you  arrive home to insure a convenient appointment time. 10. IF YOU HAVE DISABILITY OR FAMILY LEAVE FORMS, BRING THEM TO THE OFFICE FOR PROCESSING.  DO NOT GIVE THEM TO YOUR DOCTOR.   WHEN TO CALL us [phone]: 1. Poor pain control 2. Reactions / problems with new medications (rash/itching, nausea, etc)  3. Fever over 101.5 F (38.5 C) 4. Inability to urinate 5. Nausea and/or vomiting 6. Worsening swelling or bruising 7. Continued bleeding from incision. 8. Increased pain, redness, or drainage from the incision  The clinic staff is available to answer your questions during regular business hours (8:30am-5pm).  Please don't hesitate to call and ask to speak to one of our nurses for clinical concerns.   A surgeon from [facility] is always on call at the hospitals   If you have a medical emergency, go to the nearest emergency room or call 911.    [facility] [address], [place], [place]  [address] ? MAIN: [phone] ? TOLL FREE: [phone] ? FAX [phone] [email or web address]

## 2012-12-08 NOTE — Telephone Encounter (Signed)
Pt's wife called to report pt has begun a low-grade (99.8-100.0 F) temperature.  No longer on antibiotics.  She is still packing his wound BID and reports brownish-gray, thick exudate on the packing, but no odor to it.  Pt is also complaining of frequency and burning urination.  Suggested she contact his urologist to be seen today, which she will do immediately.  Paged and updated Dr. Janee Morn with same; he agrees.

## 2012-12-09 ENCOUNTER — Encounter (HOSPITAL_COMMUNITY): Payer: Self-pay

## 2012-12-09 ENCOUNTER — Other Ambulatory Visit (INDEPENDENT_AMBULATORY_CARE_PROVIDER_SITE_OTHER): Payer: Self-pay | Admitting: Surgery

## 2012-12-09 ENCOUNTER — Other Ambulatory Visit (HOSPITAL_COMMUNITY): Payer: Self-pay | Admitting: Interventional Radiology

## 2012-12-09 ENCOUNTER — Ambulatory Visit (HOSPITAL_COMMUNITY): Payer: Medicare Other

## 2012-12-09 ENCOUNTER — Ambulatory Visit (HOSPITAL_COMMUNITY)
Admission: RE | Admit: 2012-12-09 | Discharge: 2012-12-09 | Disposition: A | Discharge status: Home / Self Care | Payer: Medicare Other | Source: Ambulatory Visit | Attending: Surgery | Admitting: Surgery

## 2012-12-09 ENCOUNTER — Ambulatory Visit (HOSPITAL_COMMUNITY)
Admission: RE | Admit: 2012-12-09 | Discharge: 2012-12-09 | Disposition: A | Discharge status: Home / Self Care | Payer: Medicare Other | Source: Ambulatory Visit | Attending: Interventional Radiology | Admitting: Interventional Radiology

## 2012-12-09 DIAGNOSIS — L0291 Cutaneous abscess, unspecified: Secondary | ICD-10-CM

## 2012-12-09 DIAGNOSIS — R911 Solitary pulmonary nodule: Secondary | ICD-10-CM | POA: Insufficient documentation

## 2012-12-09 DIAGNOSIS — K5732 Diverticulitis of large intestine without perforation or abscess without bleeding: Secondary | ICD-10-CM | POA: Diagnosis not present

## 2012-12-09 DIAGNOSIS — K5289 Other specified noninfective gastroenteritis and colitis: Secondary | ICD-10-CM | POA: Diagnosis not present

## 2012-12-09 DIAGNOSIS — Z8719 Personal history of other diseases of the digestive system: Secondary | ICD-10-CM

## 2012-12-09 DIAGNOSIS — K630 Abscess of intestine: Secondary | ICD-10-CM | POA: Insufficient documentation

## 2012-12-09 DIAGNOSIS — N400 Enlarged prostate without lower urinary tract symptoms: Secondary | ICD-10-CM | POA: Diagnosis not present

## 2012-12-09 MED ORDER — IOHEXOL 300 MG/ML  SOLN
50.0000 mL | Freq: Once | INTRAMUSCULAR | Status: AC | PRN
  Administered 2012-12-09: 50 mL via ORAL

## 2012-12-09 MED ORDER — FENTANYL CITRATE 0.05 MG/ML IJ SOLN
INTRAMUSCULAR | Status: AC
  Filled 2012-12-09: qty 4

## 2012-12-09 MED ORDER — FENTANYL CITRATE 0.05 MG/ML IJ SOLN
100.0000 ug | Freq: Once | INTRAMUSCULAR | Status: AC
  Administered 2012-12-09: 100 ug via INTRAVENOUS
  Filled 2012-12-09: qty 2

## 2012-12-09 MED ORDER — IOHEXOL 300 MG/ML  SOLN
100.0000 mL | Freq: Once | INTRAMUSCULAR | Status: AC | PRN
  Administered 2012-12-09: 100 mL via INTRAVENOUS

## 2012-12-09 NOTE — Procedures (Signed)
Interventional Radiology Procedure Note  Procedure:  1.) Placement of 29F drain into small pericolonic abscess with CT guidance.  3 mL purulent material aspirated. Drain left to gravity bag. 2.) CT guided aspiration of second abscess above bladder dome.  2 mL purulent fluid aspirated. Complications: None Recommendations: - Maintain drain to gravity bag.  Avoid bulb suction to decrease risk of fistula formation. - Continue gauze packing of adjacent wound  Signed,  Sterling Big, MD Vascular & Interventional Radiologist [facility]

## 2012-12-10 ENCOUNTER — Ambulatory Visit: Payer: Medicare Other | Admitting: Cardiology

## 2012-12-10 ENCOUNTER — Telehealth (INDEPENDENT_AMBULATORY_CARE_PROVIDER_SITE_OTHER): Payer: Self-pay

## 2012-12-10 NOTE — Telephone Encounter (Signed)
I called pt to notify him that I will have to call tomorrow to schedule him for the drain study and call him back with the details. The pt understands.

## 2012-12-11 ENCOUNTER — Telehealth (INDEPENDENT_AMBULATORY_CARE_PROVIDER_SITE_OTHER): Payer: Self-pay

## 2012-12-11 DIAGNOSIS — K5732 Diverticulitis of large intestine without perforation or abscess without bleeding: Secondary | ICD-10-CM | POA: Diagnosis not present

## 2012-12-11 DIAGNOSIS — R188 Other ascites: Secondary | ICD-10-CM

## 2012-12-11 NOTE — Telephone Encounter (Signed)
LMOM at home and cell that I have the pt scheduled for his 2 appt's. I have him scheduled for the drain study in 2 wks on 9/9 at [facility] to arrive at 2:00/2:30. Then I scheduled the pt for f/u appt with Dr Michaell Cowing on 9/10 at 11:30.

## 2012-12-12 LAB — CULTURE, ROUTINE-ABSCESS: Special Requests: NORMAL

## 2012-12-13 LAB — CULTURE, ROUTINE-ABSCESS: Special Requests: NORMAL

## 2012-12-16 ENCOUNTER — Encounter (INDEPENDENT_AMBULATORY_CARE_PROVIDER_SITE_OTHER): Payer: Medicare Other | Admitting: General Surgery

## 2012-12-22 DIAGNOSIS — I10 Essential (primary) hypertension: Secondary | ICD-10-CM | POA: Diagnosis not present

## 2012-12-22 DIAGNOSIS — E785 Hyperlipidemia, unspecified: Secondary | ICD-10-CM | POA: Diagnosis not present

## 2012-12-22 DIAGNOSIS — E119 Type 2 diabetes mellitus without complications: Secondary | ICD-10-CM | POA: Diagnosis not present

## 2012-12-23 ENCOUNTER — Ambulatory Visit (HOSPITAL_COMMUNITY)
Admission: RE | Admit: 2012-12-23 | Discharge: 2012-12-23 | Disposition: A | Discharge status: Home / Self Care | Payer: Medicare Other | Source: Ambulatory Visit | Attending: Surgery | Admitting: Surgery

## 2012-12-23 DIAGNOSIS — K632 Fistula of intestine: Secondary | ICD-10-CM | POA: Insufficient documentation

## 2012-12-23 DIAGNOSIS — R188 Other ascites: Secondary | ICD-10-CM

## 2012-12-23 DIAGNOSIS — K5732 Diverticulitis of large intestine without perforation or abscess without bleeding: Secondary | ICD-10-CM | POA: Diagnosis not present

## 2012-12-23 DIAGNOSIS — K630 Abscess of intestine: Secondary | ICD-10-CM | POA: Diagnosis not present

## 2012-12-23 DIAGNOSIS — K651 Peritoneal abscess: Secondary | ICD-10-CM | POA: Insufficient documentation

## 2012-12-23 MED ORDER — IOHEXOL 300 MG/ML  SOLN
10.0000 mL | Freq: Once | INTRAMUSCULAR | Status: AC | PRN
  Administered 2012-12-23: 10 mL

## 2012-12-23 NOTE — Procedures (Signed)
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

## 2012-12-24 ENCOUNTER — Telehealth (INDEPENDENT_AMBULATORY_CARE_PROVIDER_SITE_OTHER): Payer: Self-pay

## 2012-12-24 ENCOUNTER — Encounter (INDEPENDENT_AMBULATORY_CARE_PROVIDER_SITE_OTHER): Payer: Self-pay | Admitting: Surgery

## 2012-12-24 ENCOUNTER — Ambulatory Visit (INDEPENDENT_AMBULATORY_CARE_PROVIDER_SITE_OTHER): Payer: Medicare Other | Admitting: Surgery

## 2012-12-24 ENCOUNTER — Other Ambulatory Visit (INDEPENDENT_AMBULATORY_CARE_PROVIDER_SITE_OTHER): Payer: Self-pay | Admitting: *Deleted

## 2012-12-24 VITALS — BP 112/60 | HR 64 | Temp 98.4°F | Resp 14 | Ht 73.0 in | Wt 147.6 lb

## 2012-12-24 DIAGNOSIS — N321 Vesicointestinal fistula: Secondary | ICD-10-CM | POA: Diagnosis not present

## 2012-12-24 DIAGNOSIS — K5720 Diverticulitis of large intestine with perforation and abscess without bleeding: Secondary | ICD-10-CM

## 2012-12-24 DIAGNOSIS — K5732 Diverticulitis of large intestine without perforation or abscess without bleeding: Secondary | ICD-10-CM | POA: Diagnosis not present

## 2012-12-24 DIAGNOSIS — K632 Fistula of intestine: Secondary | ICD-10-CM

## 2012-12-24 DIAGNOSIS — K5792 Diverticulitis of intestine, part unspecified, without perforation or abscess without bleeding: Secondary | ICD-10-CM

## 2012-12-24 MED ORDER — FLUCONAZOLE 200 MG PO TABS: 200.0000 mg | ORAL_TABLET | Freq: Every day | ORAL | Status: DC

## 2012-12-24 MED ORDER — METRONIDAZOLE 500 MG PO TABS: 500.0000 mg | ORAL_TABLET | ORAL | Status: DC

## 2012-12-24 MED ORDER — NEOMYCIN SULFATE 500 MG PO TABS: 1000.0000 mg | ORAL_TABLET | ORAL | Status: DC

## 2012-12-24 NOTE — Telephone Encounter (Signed)
Called pt to let him know that I did not hear back from Dr Dahlia Client office so I will have to call them again tomorrow. The pt understands.

## 2012-12-24 NOTE — Progress Notes (Signed)
Subjective:     Patient ID: [name], male   DOB: 1940/10/16, 72 y.o.   MRN: [id number]  HPI   [name]  Dec 25, 1940 [id number]  Patient Care Team: Gabriel Cirri as PCP - General (Family Medicine) Anner Crete, MD as Consulting Physician (Urology) Vertell Novak., MD as Consulting Physician (Gastroenterology) Ardeth Sportsman, MD as Consulting Physician (General Surgery)  This patient is a 72 y.o.male who presents today for surgical evaluation at the request of the patient's daughter.   Reason for visit: Persistent draining from former drain site in the setting of diverticulitis.  The patient is a pleasant but feisty elderly gentleman.  He has had attacks of abdominal pain presumed to be diverticulitis for decades.  He has never had a colonoscopy.  Apparently, his sister died of a perforation on colonoscopy that was diagnosed to light.  Diet wake [employer].  Therefore, he has refused colonoscopy.  Patient had abdominal pain.  Was admitted with a pelvic abscess presumed to be do to diverticulitis.  Surgical consultation made.  Percutaneous drain done.  Stabilized and was on home with a drain.  Followup CAT scan showed resolution of the abscess.  Drain was removed.  However, the patient began to have persistent drainage from the drain site of pus.  Came into our office urgently.  Was drained and packed.  Seen again.  Was given oral Cipro antibiotics.  That is usually the antibiotic treated for the diverticulitis that has worked in the past.  At least 7-10 day courses.  The patient developed a recurrent abscess.  A drain was replaced area and the drainage output has been less than 10 mL a day but it is green/brown.  His energy level is not normal but a little better.  He is wanting to mow the yard.  His appetite is much better.  His wife is concerned that he has not been sleeping well and has stretched/depressed.  No nausea or vomiting.  Patient wishes to have surgery done  soon.  He has seen a gastroenterologist about a colonoscopy but that has not happened yet.  He again comes today with his wife and daughter  Patient Active Problem List   Diagnosis Date Noted  . Colovesical fistula - diverticular 12/24/2012  . Colocutaneous fistula - diverticular 12/24/2012  . Diverticulitis - recurrent 12/08/2012  . Abscess of abdominal wall 11/28/2012  . Colonic diverticular abscess s/p perc drainage x2 11/10/2012  . Diabetes mellitus 11/10/2012  . Smoking 11/25/2011  . Pre-operative clearance 05/08/2011  . Bradycardia 11/27/2010  . CAD (coronary artery disease) 11/27/2010  . Hyperlipidemia 11/27/2010  . HTN (hypertension) 11/27/2010  . Subclavian steal syndrome 11/27/2010    Past Medical History  Diagnosis Date  . Diabetes mellitus   . Hyperlipidemia     Is on statin therapy  . Diverticulitis [id number]    "all my life"  . Normal nuclear stress test 2010    EF 63% with no ischemia  . Subclavian steal syndrome     left; evaluated by [name]; managed medically  . Skin cancer   . Bladder cancer   . Hypertension     LOV  Dr Shirlee Latch with clearance 05/08/11 and EKG  in [facility]   . CAD (coronary artery disease)     Mild per cath in 2002/last nuclear stress per note  2010  . Colonoscopy refused     Sister died from delayed Dx of post-colonoscopy     Past Surgical  History  Procedure Laterality Date  . Cardiac catheterization  2002    He had a which showed mild coronary atherosclerosis and normal left ventricular function.  . Rotator cuff repair      right  . Tonsillectomy    . Appendectomy    . Cystoscopy with injection      cystoscopy with BCG injections  . Transurethral resection of prostate  05/17/2011    Procedure: TRANSURETHRAL RESECTION OF THE PROSTATE WITH GYRUS INSTRUMENTS;  Surgeon: Anner Crete, MD;  Location: [facility];  Service: Urology;  Laterality: N/A;    History   Social History  . Marital Status: Married    Spouse Name: N/A    Number  of Children: N/A  . Years of Education: N/A   Occupational History  . Not on file.   Social History Main Topics  . Smoking status: Former Smoker -- 55 years    Types: Pipe    Quit date: 05/14/1955  . Smokeless tobacco: Former Neurosurgeon  . Alcohol Use: No  . Drug Use: No  . Sexual Activity: Yes   Other Topics Concern  . Not on file   Social History Narrative  . No narrative on file    Family History  Problem Relation Age of Onset  . Hypertension Mother   . Lung cancer Mother     Current Outpatient Prescriptions  Medication Sig Dispense Refill  . acidophilus (RISAQUAD) CAPS Take 2 capsules by mouth daily.  30 capsule  1  . aspirin 81 MG tablet Take 81 mg by mouth daily.      Marland Kitchen BIOTIN PO Take 1 tablet by mouth daily.       . Cyanocobalamin (VITAMIN B 12 PO) Take by mouth as directed.      . dicyclomine (BENTYL) 20 MG tablet Take 20 mg by mouth 4 (four) times daily as needed (for abdominal pain).       . fish oil-omega-3 fatty acids 1000 MG capsule Take 1 g by mouth daily.       Marland Kitchen glipiZIDE (GLUCOTROL) 5 MG tablet Take 2.5 mg by mouth daily before breakfast.       . metoprolol (LOPRESSOR) 50 MG tablet Take 1 tablet (50 mg total) by mouth 2 (two) times daily.  180 tablet  3  . Multiple Vitamin (MULTIVITAMIN) tablet Take 1 tablet by mouth daily.      . psyllium (METAMUCIL) 58.6 % powder Take 1 packet by mouth daily.      . simvastatin (ZOCOR) 40 MG tablet Take 1 tablet (40 mg total) by mouth every evening.  90 tablet  3   No current facility-administered medications for this visit.     No Known Allergies  BP 112/60  Pulse 64  Temp(Src) 98.4 F (36.9 C) (Temporal)  Resp 14  Ht 6\' 1"  (1.854 m)  Wt 147 lb 9.6 oz (66.951 kg)  BMI 19.48 kg/m2  Ct Abdomen Pelvis W Contrast  11/19/2012   *RADIOLOGY REPORT*  Clinical Data: Follow-up diverticular abscess.  CT ABDOMEN AND PELVIS WITH CONTRAST  Technique:  Multidetector CT imaging of the abdomen and pelvis was performed following  the standard protocol during bolus administration of intravenous contrast.  Contrast: OMNIPAQUE IOHEXOL 300 MG/ML  SOLN  Comparison: 11/10/2012.  Findings: The lung bases are stable.  No acute findings.  The solid abdominal organs are stable.  The gallbladder is normal. No common bile duct dilatation.  Stable atherosclerotic changes involving the aorta and branch vessels.  In  the pelvis there is a left-sided drainage catheter with complete or near complete resolution of the left-sided diverticular abscess. The colon demonstrates some residual inflammation and wall thickening with diffuse diverticulosis.  I do not see any discrete residual fluid collection, just mild inflammatory changes in the mesocolon.  IMPRESSION: Resolution of left-sided complex diverticular abscess.  Minimal residual post inflammatory changes but no definite fluid collection.  Mild residual inflammation of the sigmoid colon.   Original Report Authenticated By: Rudie Meyer, M.D.   Ct Image Guided Fluid Drain By Catheter  11/11/2012   *RADIOLOGY REPORT*  Indication: Perforated diverticulitis, now with abscess within the left lower abdomen.  CT GUIDED LEFT LOWER ABDOMINAL DRAINAGE CATHETER PLACEMENT  Comparison: CT abdomen pelvis - 11/10/2012  Medications: Fentanyl 50 mcg IV; Versed 2 mg IV  Total Moderate Sedation time: 20 minutes  Contrast: None  Complications: None immediate  Technique / Findings:  Informed written consent was obtained from the patient after a discussion of the risks, benefits and alternatives to treatment. The patient was placed supine on the CT gantry and a pre procedural CT was performed re-demonstrating the known abscess/fluid collection within the left lower abdominal quadrant.  The procedure was planned.   A timeout was performed prior to the initiation of the procedure.  The left lower abdomen was prepped and draped in the usual sterile fashion.   The overlying soft tissues were anesthetized with 1% lidocaine  with epinephrine.  Appropriate trajectory was planned with the use of a 22 gauge spinal needle.  An 18 gauge trocar needle was advanced into the abscess/fluid collection and a short Amplatz super stiff wire was coiled within the collection. Appropriate positioning was confirmed with a limited CT scan.  The tract was serially dilated allowing placement of a 10 [place] all- purpose drainage catheter.  Appropriate positioning was confirmed with a limited postprocedural CT scan.  40 ml of purulent fluid was aspirated.  The tube was connected to a drainage bag and sutured in place.  A dressing was placed.  The patient tolerated the procedure well without immediate post procedural complication.  Impression:  Successful CT guided placement of a 10 French all purpose drain catheter into the location with aspiration of 40 mL of purulent fluid.  Samples were sent to the laboratory as requested by the ordering clinical team.   Original Report Authenticated By: Tacey Ruiz, MD     Review of Systems  Constitutional: Positive for unexpected weight change. Negative for fever, chills and diaphoresis.  HENT: Negative for nosebleeds, sore throat, facial swelling, mouth sores, trouble swallowing and ear discharge.   Eyes: Negative for photophobia, discharge and visual disturbance.  Respiratory: Negative for choking, chest tightness, shortness of breath and stridor.   Cardiovascular: Negative for chest pain and palpitations.  Gastrointestinal: Positive for abdominal pain and diarrhea. Negative for nausea, vomiting, constipation, blood in stool, abdominal distention, anal bleeding and rectal pain.  Endocrine: Negative for cold intolerance and heat intolerance.  Genitourinary: Positive for dysuria. Negative for urgency, hematuria, discharge, difficulty urinating, penile pain and testicular pain.  Musculoskeletal: Negative for myalgias, back pain, arthralgias and gait problem.  Skin: Positive for wound. Negative for color  change, pallor and rash.  Allergic/Immunologic: Negative for environmental allergies and food allergies.  Neurological: Negative for dizziness, speech difficulty, weakness, numbness and headaches.  Hematological: Negative for adenopathy. Does not bruise/bleed easily.  Psychiatric/Behavioral: Negative for hallucinations, confusion and agitation. The patient is not nervous/anxious.        Objective:  Physical Exam  Constitutional: He is oriented to person, place, and time. He appears well-developed. He appears cachectic. He is cooperative.  Non-toxic appearance. He does not have a sickly appearance. He does not appear ill. No distress.  HENT:  Head: Normocephalic.  Mouth/Throat: Oropharynx is clear and moist. No oropharyngeal exudate.  Eyes: Conjunctivae and EOM are normal. Pupils are equal, round, and reactive to light. No scleral icterus.  Neck: Normal range of motion. Neck supple. No tracheal deviation present.  Cardiovascular: Normal rate, regular rhythm and intact distal pulses.   Pulmonary/Chest: Effort normal and breath sounds normal. No respiratory distress.  Abdominal: Soft. He exhibits no distension. There is tenderness in the suprapubic area. There is no rigidity, no rebound, no guarding, no tenderness at McBurney's point and negative Murphy's sign. No hernia. Hernia confirmed negative in the ventral area, confirmed negative in the right inguinal area and confirmed negative in the left inguinal area.    Musculoskeletal: Normal range of motion. He exhibits no tenderness.  Moves around quite easily.  More energetic today  Lymphadenopathy:    He has no cervical adenopathy.       Right: No inguinal adenopathy present.       Left: No inguinal adenopathy present.  Neurological: He is alert and oriented to person, place, and time. No cranial nerve deficit. He exhibits normal muscle tone. Coordination normal.  Skin: Skin is warm and dry. No rash noted. He is not diaphoretic. No  erythema. No pallor.  Psychiatric: He has a normal mood and affect. His speech is normal and behavior is normal. Judgment and thought content normal. Cognition and memory are normal.  Joking, pleasant.  Deferential       Assessment:     Persistent sigmoid diverticulitis with recurrent abscess Status post second drainage.  Abscess resolved but now developing chronic fistulas to drain and bladder..     Plan:     I spent some time reviewing the chart and discussing with the patient, his wife, and his daughter.   I tried to noted that the absence of any abscess is good.  Appetite and energy level returning is good.  It is not surprising that the fistula has formed.  The drain is controlling it for now.  The drain stays until he has surgery.  He grew out Candida from the abscess.  Therefore, fluconazole PO x10 days  Exercise more.  He claims family is keeping introduced up.  I tried to reassure the family that increasing exercise will help improve his ability to recover and tolerate surgery.  The patient's wife is hoping I can give him a sedative for sleeping aid.  I told her I was not comfortable with that and deferred to the primary care physician.  Certainly exercising more will help.  Consider Benadryl at bedtime to help him actually fall asleep.  I did not feel comfortable starting him on benzodiazepines  Since I am just managing the diverticulitis and did not have a long-term relationship with this patient.  She seemed slightly annoyed/disappointed, but understood.  He will require surgery to remove the colon that contains this problem area.  Would like six weeks since drain = early October.  Set up elective colonoscopy to rule out cancer since she has never had a colonoscopy with Dr. Randa Evens with [facility].  That can happen the day before  Then plan laparoscopically assisted sigmoid colectomy with anastomosis provided that is the only problem area seen on endoscopy.  I think it would  be wise  to have ureteral stents placed by urology as well given the history of fistula to the bladder and recurrent abscesses:  The anatomy & physiology of the digestive tract was discussed.  The pathophysiology was discussed.  Natural history risks without surgery was discussed.   I feel the risks of no intervention will lead to serious problems that outweigh the operative risks; therefore, I recommended a partial colectomy to remove the pathology.  Laparoscopic & open techniques were discussed.   Risks such as bleeding, infection, abscess, leak, reoperation, possible ostomy, hernia, heart attack, death, and other risks were discussed.  I noted a good likelihood this will help address the problem.   Goals of post-operative recovery were discussed as well.  We will work to minimize complications.  An educational handout on the pathology was given as well.  Questions were answered.  The patient & family expressed understanding & wishes to proceed with surgery.

## 2012-12-24 NOTE — Patient Instructions (Signed)
COLON PREP INSTRUCTIONS for lower/distal colectomy:   Obtain what you need at a pharmacy of your choice:      Prescriptions for your oral antibiotics (Neomycin & Metronidazole)     A bottle of Milk of Magnesia    2 Fleet enemas (generic form OK to use)   DAY PRIOR TO SURGERY:    1:00pm    o Take 2 oz (4 tablespoons) Milk of Magnesia. o Take 2 Neomycin 576m tablets & 2 Metronidazole 5047mtablets     3:00pm:    o Take 2 Neomycin 50031mablets & 2 Metronidazole 500m32mblets     Bedtime (~10:00pm)  o Take 2 Neomycin 500mg40mlets & 2 Metronidazole 500mg 8mets    Midnight:  Do not eat or drink anything after midnight the night before your surgery.   MORNING OF PROCEDURE:    Remember to not to drink or eat anything that morning    Upon waking up, take the 2 Fleet enemas.  o Use at least 1 hour before leaving house o Try to retain each enema for 5-10 minutes before expelling it.  This should clean your lower colon sufficiently   If you have questions or problems, please call [email or web address] speak to someone in the clinic department at our office    ABDOMINAL SURGERY: POST OP INSTRUCTIONS  1. DIET: Follow a light bland diet the first 24 hours after arrival home, such as soup, liquids, crackers, etc.  Be sure to include lots of fluids daily.  Avoid fast food or heavy meals as your are more likely to get nauseated.  Eat a low fat the next few days after surgery.   2. Take your usually prescribed home medications unless otherwise directed. 3. PAIN CONTROL: a. Pain is best controlled by a usual combination of three different methods TOGETHER: i. Ice/Heat ii. Over the counter pain medication iii. Prescription pain medication b. Most patients will experience some swelling and bruising around the incisions.  Ice packs or heating pads (30-60 minutes up to 6 times a day) will help. Use ice for the first few days to help decrease swelling and bruising,  then switch to heat to help relax tight/sore spots and speed recovery.  Some people prefer to use ice alone, heat alone, alternating between ice & heat.  Experiment to what works for you.  Swelling and bruising can take several weeks to resolve.   c. It is helpful to take an over-the-counter pain medication regularly for the first few weeks.  Choose one of the following that works best for you: i. Naproxen (Aleve, etc)  Two 220mg t27mtwice a day ii. Ibuprofen (Advil, etc) Three 200mg ta40mour times a day (every meal & bedtime) iii. Acetaminophen (Tylenol, etc) 500-650mg fou6mmes a day (every meal & bedtime) d. A  prescription for pain medication (such as oxycodone, hydrocodone, etc) should be given to you upon discharge.  Take your pain medication as prescribed.  i. If you are having problems/concerns with the prescription medicine (does not control pain, nausea, vomiting, rash, itching, etc), please call us [phone] we need to switch you to a different pain medicine that will work better for you and/or control your side effect better. ii. If you need a refill on your pain medication, please contact your pharmacy.  They will contact our office to request authorization. Prescriptions will not be filled after 5 pm or on week-ends. 4. Avoid getting constipated.  Between the surgery  and the pain medications, it is common to experience some constipation.  Increasing fluid intake and taking a fiber supplement (such as Metamucil, Citrucel, FiberCon, MiraLax, etc) 1-2 times a day regularly will usually help prevent this problem from occurring.  A mild laxative (prune juice, Milk of Magnesia, MiraLax, etc) should be taken according to package directions if there are no bowel movements after 48 hours.   5. Watch out for diarrhea.  If you have many loose bowel movements, simplify your diet to bland foods & liquids for a few days.  Stop any stool softeners and decrease your fiber supplement.   Switching to mild anti-diarrheal medications (Kayopectate, Pepto Bismol) can help.  If this worsens or does not improve, please call us. 6. Wash / shower every day.  You may shower over the incision / wound.  Avoid baths until the skin is fully healed.  Continue to shower over incision(s) after the dressing is off. 7. Remove your waterproof bandages 5 days after surgery.  You may leave the incision open to air.  You may replace a dressing/Band-Aid to cover the incision for comfort if you wish. 8. ACTIVITIES as tolerated:   a. You may resume regular (light) daily activities beginning the next day-such as daily self-care, walking, climbing stairs-gradually increasing activities as tolerated.  If you can walk 30 minutes without difficulty, it is safe to try more intense activity such as jogging, treadmill, bicycling, low-impact aerobics, swimming, etc. b. Save the most intensive and strenuous activity for last such as sit-ups, heavy lifting, contact sports, etc  Refrain from any heavy lifting or straining until you are off narcotics for pain control.   c. DO NOT PUSH THROUGH PAIN.  Let pain be your guide: If it hurts to do something, don't do it.  Pain is your body warning you to avoid that activity for another week until the pain goes down. d. You may drive when you are no longer taking prescription pain medication, you can comfortably wear a seatbelt, and you can safely maneuver your car and apply brakes. e. Bonita Quin may have sexual intercourse when it is comfortable.  9. FOLLOW UP in our office a. Please call [facility] at [phone] to set up an appointment to see your surgeon in the office for a follow-up appointment approximately 1-2 weeks after your surgery. b. Make sure that you call for this appointment the day you arrive home to insure a convenient appointment time. 10. IF YOU HAVE DISABILITY OR FAMILY LEAVE FORMS, BRING THEM TO THE OFFICE FOR PROCESSING.  DO NOT GIVE THEM TO YOUR DOCTOR.   WHEN TO  CALL us [phone]: 1. Poor pain control 2. Reactions / problems with new medications (rash/itching, nausea, etc)  3. Fever over 101.5 F (38.5 C) 4. Inability to urinate 5. Nausea and/or vomiting 6. Worsening swelling or bruising 7. Continued bleeding from incision. 8. Increased pain, redness, or drainage from the incision  The clinic staff is available to answer your questions during regular business hours (8:30am-5pm).  Please don't hesitate to call and ask to speak to one of our nurses for clinical concerns.   A surgeon from [facility] is always on call at the hospitals   If you have a medical emergency, go to the nearest emergency room or call 911.    [facility] [address], [address], [place], [place]  [address] ? MAIN: [phone] ? TOLL FREE: [phone] ? FAX [phone] [email or web address]   Diverticulitis  A diverticulum is a small pouch or sac on the colon. Diverticulosis is the presence of these diverticula on the colon. Diverticulitis is the irritation (inflammation) or infection of diverticula. CAUSES  The colon and its diverticula contain bacteria. If food particles block the tiny opening to a diverticulum, the bacteria inside can grow and cause an increase in pressure. This leads to infection and inflammation and is called diverticulitis. SYMPTOMS   Abdominal pain and tenderness. Usually, the pain is located on the left side of your abdomen. However, it could be located elsewhere.  Fever.  Bloating.  Feeling sick to your stomach (nausea).  Throwing up (vomiting).  Abnormal stools. DIAGNOSIS  Your caregiver will take a history and perform a physical exam. Since many things can cause abdominal pain, other tests may be necessary. Tests may include:  Blood tests.  Urine tests.  X-ray of the abdomen.  CT scan of the abdomen. Sometimes, surgery is needed to determine if diverticulitis or other  conditions are causing your symptoms. TREATMENT  Most of the time, you can be treated without surgery. Treatment includes:  Resting the bowels by only having liquids for a few days. As you improve, you will need to eat a low-fiber diet.  Intravenous (IV) fluids if you are losing body fluids (dehydrated).  Antibiotic medicines that treat infections may be given.  Pain and nausea medicine, if needed.  Surgery if the inflamed diverticulum has burst. HOME CARE INSTRUCTIONS   Try a clear liquid diet (broth, tea, or water for as long as directed by your caregiver). You may then gradually begin a low-fiber diet as tolerated.  A low-fiber diet is a diet with less than 10 grams of fiber. Choose the foods below to reduce fiber in the diet:  White breads, cereals, rice, and pasta.  Cooked fruits and vegetables or soft fresh fruits and vegetables without the skin.  Ground or well-cooked tender beef, ham, veal, lamb, pork, or poultry.  Eggs and seafood.  After your diverticulitis symptoms have improved, your caregiver may put you on a high-fiber diet. A high-fiber diet includes 14 grams of fiber for every 1000 calories consumed. For a standard 2000 calorie diet, you would need 28 grams of fiber. Follow these diet guidelines to help you increase the fiber in your diet. It is important to slowly increase the amount fiber in your diet to avoid gas, constipation, and bloating.  Choose whole-grain breads, cereals, pasta, and brown rice.  Choose fresh fruits and vegetables with the skin on. Do not overcook vegetables because the more vegetables are cooked, the more fiber is lost.  Choose more nuts, seeds, legumes, dried peas, beans, and lentils.  Look for food products that have greater than 3 grams of fiber per serving on the Nutrition Facts label.  Take all medicine as directed by your caregiver.  If your caregiver has given you a follow-up appointment, it is very important that you go. Not  going could result in lasting (chronic) or permanent injury, pain, and disability. If there is any problem keeping the appointment, call to reschedule. SEEK MEDICAL CARE IF:   Your pain does not improve.  You have a hard time advancing your diet beyond clear liquids.  Your bowel movements do not return to normal. SEEK IMMEDIATE MEDICAL CARE IF:   Your pain becomes worse.  You have an oral temperature above 102 F (38.9 C), not controlled by medicine.  You have repeated vomiting.  You have bloody or black, tarry stools.  Symptoms that brought you to your caregiver become worse or are not getting better. MAKE SURE YOU:   Understand these instructions.  Will watch your condition.  Will get help right away if you are not doing well or get worse. Document Released: 01/10/2005 Document Revised: 06/25/2011 Document Reviewed: 05/08/2010 [facility] Patient Information 2014 [place], [place].

## 2012-12-24 NOTE — Telephone Encounter (Signed)
[name] for [name] to call me back so I can discuss with her about the pt needing a colonoscopy the day before surgery. The pt only wants to do one prep for the colonoscopy and surgery.

## 2012-12-25 ENCOUNTER — Telehealth (INDEPENDENT_AMBULATORY_CARE_PROVIDER_SITE_OTHER): Payer: Self-pay | Admitting: Surgery

## 2012-12-25 NOTE — Telephone Encounter (Signed)
Orders state pt needs colonoscopy with dr Randa Evens / are we waiting until then to schedule sx?

## 2012-12-25 NOTE — Telephone Encounter (Signed)
Surgery needs to happen in early October.  Colonoscopy needs to happen before then.  Patient is due to get it done with Dr. Randa Evens

## 2012-12-25 NOTE — Telephone Encounter (Signed)
LMOM notifying pt that I did receive a call back from Dr Randa Evens office about the colonoscopy being done the day before surgery. [name] with Dr Randa Evens is aware of the coordination and she will be speaking with  Dr Randa Evens to see if the pt can be done in the endo unit vs.hospital. I have turned the surgical orders into our surgery schedulers who will work on getting the coordination with Dr Randa Evens for the colonoscopy and [facility] for the stents for the pt to have the colon surgery by Dr Michaell Cowing. If any questions the pt can speak to me.

## 2012-12-25 NOTE — Telephone Encounter (Signed)
I have been speaking with Denny Peon our surgery scheduler about this pt and she is fully aware about the coordination with colonoscopy.

## 2012-12-26 ENCOUNTER — Telehealth (INDEPENDENT_AMBULATORY_CARE_PROVIDER_SITE_OTHER): Payer: Self-pay

## 2012-12-26 DIAGNOSIS — R350 Frequency of micturition: Secondary | ICD-10-CM

## 2012-12-26 DIAGNOSIS — R300 Dysuria: Secondary | ICD-10-CM

## 2012-12-26 MED ORDER — CIPROFLOXACIN HCL 500 MG PO TABS: 500.0000 mg | ORAL_TABLET | Freq: Two times a day (BID) | ORAL | Status: DC

## 2012-12-26 NOTE — Telephone Encounter (Signed)
Called pt and pt's wife back to notify them that I did speak with Dr Luisa Hart about the pt's symptoms. Per Dr Luisa Hart it's not uncommon for the pt with a fistula pressing against the bladder to have urinary frequency and urinary burning sometimes. Dr Luisa Hart recommends the pt to be on Cipro 500mg  BID x 10 days. I eprescribed the Cipro 500mg  BID x 10 days to [facility] per Dr Luisa Hart. I advised pt and pt's wife if the pt gets worse or runs a fever over 101 this weekend they will need to call us to let us know. The pt understands.

## 2012-12-26 NOTE — Telephone Encounter (Signed)
Pt's wife calling in to b/c the pt was up all night having urinary frequency with some burning. The pt was running a low grade temp of 100.8, sweating, and just not feeling well. The pt is not on any antibiotics except the Diflucan rx that Dr Michaell Cowing prescribed for yeast growing out on the cultures. I advised pt that I would check with one of Dr Gordy Savers partners since he was not in the office today. I will call the pt back.

## 2012-12-31 ENCOUNTER — Encounter (INDEPENDENT_AMBULATORY_CARE_PROVIDER_SITE_OTHER): Payer: Medicare Other | Admitting: General Surgery

## 2013-01-02 ENCOUNTER — Telehealth (INDEPENDENT_AMBULATORY_CARE_PROVIDER_SITE_OTHER): Payer: Self-pay

## 2013-01-02 DIAGNOSIS — R350 Frequency of micturition: Secondary | ICD-10-CM

## 2013-01-02 DIAGNOSIS — R300 Dysuria: Secondary | ICD-10-CM

## 2013-01-02 MED ORDER — CIPROFLOXACIN HCL 500 MG PO TABS: 500.0000 mg | ORAL_TABLET | Freq: Two times a day (BID) | ORAL | Status: AC

## 2013-01-02 NOTE — Telephone Encounter (Signed)
Returned pt's call. The pt is wondering about his surgery date and I explained that Florentina Addison our surgery scheduler was working on the date. The pt also wanted to know when he completes his Cipro 500mg  on Sunday can he get another refill now to stay on the Cipro till his surgery date. The pt does not want to start feeling bad again with a low grade temp like he did last week before he started the Cipro by Dr Luisa Hart. Please advise.

## 2013-01-02 NOTE — Telephone Encounter (Signed)
Returned pt's call. I notified him that we did refill the Cipro 500mg  BID x 10 days if symptoms reoccur with low grade temp or UTI then the pt will need to restart the Cipro per Dr Michaell Cowing. I e prescribed the script to [facility] per Dr Michaell Cowing.

## 2013-01-02 NOTE — Telephone Encounter (Signed)
The patient grew Candida in his abscess - hence the fluconazole/Diflucan.  Cipro is really not necessary anymore from an abscess standpoint.  Cipro was for the UTI.  He can have a refill in case but does not need to stay on Abx unless returning Sx or fever > 101F

## 2013-01-05 LAB — FUNGUS CULTURE W SMEAR
Fungal Smear: NONE SEEN
Special Requests: NORMAL
Special Requests: NORMAL

## 2013-01-06 ENCOUNTER — Other Ambulatory Visit: Payer: Self-pay | Admitting: Urology

## 2013-01-06 ENCOUNTER — Telehealth (INDEPENDENT_AMBULATORY_CARE_PROVIDER_SITE_OTHER): Payer: Self-pay

## 2013-01-06 NOTE — Telephone Encounter (Signed)
The pt is scheduled for a colonoscopy the day before surgery at 2:15 so this will mess up the schedule of the pt taking his oral antibiotics the day before surgery. The pt was told to take the antibiotics 1pm,3pm,and 10pm on the bottle. I asked Dr Michaell Cowing what he would advise on the time since the colonoscopy is in the pm the day before surgery and the pt has to prep for the colonoscopy. Dr Michaell Cowing advises the pt to start his antibiotics at 3pm,10pm and the very next day which will be the surgery day. The pt may take the last round of antibiotics as soon as he wakes up the morning of surgery and take with a tiny sip of water. [name] will notify the pt.

## 2013-01-20 ENCOUNTER — Encounter (HOSPITAL_COMMUNITY): Payer: Self-pay | Admitting: Pharmacy Technician

## 2013-01-23 ENCOUNTER — Encounter (INDEPENDENT_AMBULATORY_CARE_PROVIDER_SITE_OTHER): Payer: Self-pay

## 2013-01-23 ENCOUNTER — Encounter (HOSPITAL_COMMUNITY): Payer: Self-pay

## 2013-01-23 ENCOUNTER — Ambulatory Visit (HOSPITAL_COMMUNITY)
Admission: RE | Admit: 2013-01-23 | Discharge: 2013-01-23 | Disposition: A | Discharge status: Home / Self Care | Payer: Medicare Other | Source: Ambulatory Visit | Attending: Surgery | Admitting: Surgery

## 2013-01-23 ENCOUNTER — Encounter (HOSPITAL_COMMUNITY)
Admission: RE | Admit: 2013-01-23 | Discharge: 2013-01-23 | Disposition: A | Discharge status: Home / Self Care | Payer: Medicare Other | Source: Ambulatory Visit | Attending: Surgery | Admitting: Surgery

## 2013-01-23 DIAGNOSIS — N321 Vesicointestinal fistula: Secondary | ICD-10-CM | POA: Diagnosis not present

## 2013-01-23 DIAGNOSIS — K5732 Diverticulitis of large intestine without perforation or abscess without bleeding: Secondary | ICD-10-CM | POA: Insufficient documentation

## 2013-01-23 DIAGNOSIS — Z01812 Encounter for preprocedural laboratory examination: Secondary | ICD-10-CM | POA: Insufficient documentation

## 2013-01-23 DIAGNOSIS — Z01818 Encounter for other preprocedural examination: Secondary | ICD-10-CM | POA: Insufficient documentation

## 2013-01-23 HISTORY — PX: PERITONEAL WOUND DRAINAGE: SUR425

## 2013-01-23 HISTORY — PX: CATARACT EXTRACTION, BILATERAL: SHX1313

## 2013-01-23 LAB — CBC
Hemoglobin: 14.2 g/dL (ref 13.0–17.0)
MCH: 33.3 pg (ref 26.0–34.0)
Platelets: 261 10*3/uL (ref 150–400)
RBC: 4.27 MIL/uL (ref 4.22–5.81)
RDW: 14.8 % (ref 11.5–15.5)
WBC: 6.5 10*3/uL (ref 4.0–10.5)

## 2013-01-23 LAB — BASIC METABOLIC PANEL
CO2: 30 mEq/L (ref 19–32)
Calcium: 9.1 mg/dL (ref 8.4–10.5)
Chloride: 101 mEq/L (ref 96–112)
Creatinine, Ser: 0.71 mg/dL (ref 0.50–1.35)
GFR calc Af Amer: >90 mL/min (ref >90)
Glucose, Bld: 90 mg/dL (ref 70–99)
Sodium: 140 mEq/L (ref 135–145)

## 2013-01-23 NOTE — Patient Instructions (Signed)
20 [name]  01/23/2013   Your procedure is scheduled on: 10-16  -2014  Report to [facility] at      0530  AM .  Call this number if you have problems the morning of surgery: [phone]  Or Presurgical Testing [phone](Aliene Tamura)   Remember: Follow any bowel prep instructions per MD office.   Do not eat food:After Midnight.   Take these medicines the morning of surgery with A SIP OF WATER: Metoprolol. Continue Simvastatin night before. Do not take any Diabetic meds AM of.   Do not wear jewelry, make-up or nail polish.  Do not wear lotions, powders, or perfumes. You may wear deodorant.  Do not shave 12 hours prior to first CHG shower(legs and under arms).(face and neck okay.)  Do not bring valuables to the hospital.  Contacts, dentures or bridgework,body piercing,  may not be worn into surgery.  Leave suitcase in the car. After surgery it may be brought to your room.  For patients admitted to the hospital, checkout time is 11:00 AM the day of discharge.   Patients discharged the day of surgery will not be allowed to drive home. Must have responsible person with you x 24 hours once discharged.  Name and phone number of your driver: [name]-spouse [phone] cell  Special Instructions: CHG(Chlorhedine 4%-"Hibiclens","Betasept","Aplicare") Shower Use Special Wash: see special instructions.(avoid face and genitals)   Please read over the following fact sheets that you were given: Blood Transfusion fact sheet, Incentive Spirometry Instruction.    Failure to follow these instructions may result in Cancellation of your surgery.   Patient signature_______________________________________________________

## 2013-01-23 NOTE — Pre-Procedure Instructions (Signed)
01-23-13 EKG 8'14/ CT abd/pelvis 8'14/ CXR done today.

## 2013-01-27 ENCOUNTER — Other Ambulatory Visit: Payer: Self-pay | Admitting: Urology

## 2013-01-28 DIAGNOSIS — Z538 Procedure and treatment not carried out for other reasons: Secondary | ICD-10-CM | POA: Diagnosis not present

## 2013-01-28 DIAGNOSIS — K5732 Diverticulitis of large intestine without perforation or abscess without bleeding: Secondary | ICD-10-CM | POA: Diagnosis not present

## 2013-01-28 MED ORDER — SODIUM CHLORIDE 0.9 % IV SOLN
INTRAVENOUS | Status: AC
  Administered 2013-01-29: 10:00:00 via INTRAPERITONEAL
  Filled 2013-01-28: qty 6

## 2013-01-28 MED ORDER — SODIUM CHLORIDE 0.9 % IV SOLN: 1.0000 "application " | INTRAVENOUS | Status: DC

## 2013-01-28 MED ORDER — FLUCONAZOLE IN SODIUM CHLORIDE 200-0.9 MG/100ML-% IV SOLN
200.0000 mg | INTRAVENOUS | Status: AC
  Administered 2013-01-29: 200 mg via INTRAVENOUS
  Filled 2013-01-28: qty 100

## 2013-01-29 ENCOUNTER — Inpatient Hospital Stay (HOSPITAL_COMMUNITY): Payer: Medicare Other

## 2013-01-29 ENCOUNTER — Inpatient Hospital Stay (HOSPITAL_COMMUNITY): Payer: Medicare Other | Admitting: Anesthesiology

## 2013-01-29 ENCOUNTER — Encounter (HOSPITAL_COMMUNITY)
Admission: RE | Disposition: A | Discharge status: Home / Self Care | Payer: Self-pay | Source: Ambulatory Visit | Attending: Surgery

## 2013-01-29 ENCOUNTER — Encounter (HOSPITAL_COMMUNITY): Payer: Self-pay | Admitting: *Deleted

## 2013-01-29 ENCOUNTER — Inpatient Hospital Stay (HOSPITAL_COMMUNITY)
Admission: RE | Admit: 2013-01-29 | Discharge: 2013-02-03 | DRG: 330 | Disposition: A | Discharge status: Home / Self Care | Payer: Medicare Other | Source: Ambulatory Visit | Attending: Surgery | Admitting: Surgery

## 2013-01-29 ENCOUNTER — Encounter (HOSPITAL_COMMUNITY): Payer: Medicare Other | Admitting: Anesthesiology

## 2013-01-29 ENCOUNTER — Telehealth (INDEPENDENT_AMBULATORY_CARE_PROVIDER_SITE_OTHER): Payer: Self-pay | Admitting: General Surgery

## 2013-01-29 DIAGNOSIS — I2510 Atherosclerotic heart disease of native coronary artery without angina pectoris: Secondary | ICD-10-CM | POA: Diagnosis present

## 2013-01-29 DIAGNOSIS — K630 Abscess of intestine: Secondary | ICD-10-CM | POA: Diagnosis not present

## 2013-01-29 DIAGNOSIS — K5792 Diverticulitis of intestine, part unspecified, without perforation or abscess without bleeding: Secondary | ICD-10-CM | POA: Diagnosis present

## 2013-01-29 DIAGNOSIS — Z8551 Personal history of malignant neoplasm of bladder: Secondary | ICD-10-CM | POA: Diagnosis not present

## 2013-01-29 DIAGNOSIS — E785 Hyperlipidemia, unspecified: Secondary | ICD-10-CM | POA: Diagnosis present

## 2013-01-29 DIAGNOSIS — R001 Bradycardia, unspecified: Secondary | ICD-10-CM | POA: Diagnosis present

## 2013-01-29 DIAGNOSIS — G458 Other transient cerebral ischemic attacks and related syndromes: Secondary | ICD-10-CM | POA: Diagnosis present

## 2013-01-29 DIAGNOSIS — K5732 Diverticulitis of large intestine without perforation or abscess without bleeding: Secondary | ICD-10-CM | POA: Diagnosis present

## 2013-01-29 DIAGNOSIS — I10 Essential (primary) hypertension: Secondary | ICD-10-CM | POA: Diagnosis present

## 2013-01-29 DIAGNOSIS — Z23 Encounter for immunization: Secondary | ICD-10-CM | POA: Diagnosis not present

## 2013-01-29 DIAGNOSIS — F17200 Nicotine dependence, unspecified, uncomplicated: Secondary | ICD-10-CM | POA: Diagnosis present

## 2013-01-29 DIAGNOSIS — I498 Other specified cardiac arrhythmias: Secondary | ICD-10-CM | POA: Diagnosis present

## 2013-01-29 DIAGNOSIS — K632 Fistula of intestine: Secondary | ICD-10-CM

## 2013-01-29 DIAGNOSIS — K5720 Diverticulitis of large intestine with perforation and abscess without bleeding: Secondary | ICD-10-CM | POA: Diagnosis present

## 2013-01-29 DIAGNOSIS — N321 Vesicointestinal fistula: Secondary | ICD-10-CM | POA: Diagnosis present

## 2013-01-29 DIAGNOSIS — K6389 Other specified diseases of intestine: Secondary | ICD-10-CM | POA: Diagnosis present

## 2013-01-29 DIAGNOSIS — R4182 Altered mental status, unspecified: Secondary | ICD-10-CM | POA: Diagnosis not present

## 2013-01-29 DIAGNOSIS — N400 Enlarged prostate without lower urinary tract symptoms: Secondary | ICD-10-CM | POA: Diagnosis present

## 2013-01-29 DIAGNOSIS — K660 Peritoneal adhesions (postprocedural) (postinfection): Secondary | ICD-10-CM | POA: Diagnosis present

## 2013-01-29 DIAGNOSIS — E119 Type 2 diabetes mellitus without complications: Secondary | ICD-10-CM | POA: Diagnosis present

## 2013-01-29 DIAGNOSIS — K623 Rectal prolapse: Secondary | ICD-10-CM | POA: Diagnosis not present

## 2013-01-29 DIAGNOSIS — IMO0002 Reserved for concepts with insufficient information to code with codable children: Secondary | ICD-10-CM | POA: Diagnosis not present

## 2013-01-29 HISTORY — PX: FISTULOTOMY: SHX6413

## 2013-01-29 HISTORY — PX: CYSTOSCOPY W/ URETERAL STENT PLACEMENT: SHX1429

## 2013-01-29 HISTORY — PX: LAPAROSCOPIC SIGMOID COLECTOMY: SHX5928

## 2013-01-29 LAB — TYPE AND SCREEN
ABO/RH(D): A POS
Antibody Screen: NEGATIVE

## 2013-01-29 LAB — BLOOD GAS, ARTERIAL
Drawn by: 308601
FIO2: 1 %
pCO2 arterial: 45.4 mmHg — ABNORMAL HIGH (ref 35.0–45.0)
pH, Arterial: 7.301 — ABNORMAL LOW (ref 7.350–7.450)

## 2013-01-29 LAB — GLUCOSE, CAPILLARY
Glucose-Capillary: 179 mg/dL — ABNORMAL HIGH (ref 70–99)
Glucose-Capillary: 145 mg/dL — ABNORMAL HIGH (ref 70–99)

## 2013-01-29 LAB — ABO/RH: ABO/RH(D): A POS

## 2013-01-29 SURGERY — COLECTOMY, SIGMOID, LAPAROSCOPIC
Anesthesia: General | Site: Pelvis | Wound class: Contaminated

## 2013-01-29 MED ORDER — PROMETHAZINE HCL 25 MG/ML IJ SOLN: 6.2500 mg | INTRAMUSCULAR | Status: DC | PRN

## 2013-01-29 MED ORDER — ACETAMINOPHEN 500 MG PO TABS
1000.0000 mg | ORAL_TABLET | Freq: Three times a day (TID) | ORAL | Status: DC
  Administered 2013-01-29 – 2013-02-02 (×13): 1000 mg via ORAL
  Filled 2013-01-29 (×16): qty 2

## 2013-01-29 MED ORDER — DIPHENHYDRAMINE HCL 50 MG/ML IJ SOLN: 12.5000 mg | Freq: Four times a day (QID) | INTRAMUSCULAR | Status: DC | PRN

## 2013-01-29 MED ORDER — HYDROMORPHONE HCL PF 1 MG/ML IJ SOLN
INTRAMUSCULAR | Status: AC
  Filled 2013-01-29: qty 1

## 2013-01-29 MED ORDER — HEPARIN SODIUM (PORCINE) 5000 UNIT/ML IJ SOLN
5000.0000 [IU] | Freq: Once | INTRAMUSCULAR | Status: AC
  Administered 2013-01-29: 5000 [IU] via SUBCUTANEOUS
  Filled 2013-01-29: qty 1

## 2013-01-29 MED ORDER — METHYLENE BLUE 1 % INJ SOLN
INTRAMUSCULAR | Status: AC
  Filled 2013-01-29: qty 10

## 2013-01-29 MED ORDER — BUPIVACAINE-EPINEPHRINE 0.25% -1:200000 IJ SOLN
INTRAMUSCULAR | Status: AC
  Filled 2013-01-29: qty 2

## 2013-01-29 MED ORDER — NEOSTIGMINE METHYLSULFATE 1 MG/ML IJ SOLN
INTRAMUSCULAR | Status: DC | PRN
  Administered 2013-01-29: 3 mg via INTRAVENOUS

## 2013-01-29 MED ORDER — ACETAMINOPHEN 10 MG/ML IV SOLN
1000.0000 mg | Freq: Once | INTRAVENOUS | Status: AC
  Administered 2013-01-29: 1000 mg via INTRAVENOUS
  Filled 2013-01-29: qty 100

## 2013-01-29 MED ORDER — STERILE WATER FOR IRRIGATION IR SOLN
Status: DC | PRN
  Administered 2013-01-29: 1000 mL via INTRAVESICAL

## 2013-01-29 MED ORDER — BUPIVACAINE 0.25 % ON-Q PUMP DUAL CATH 300 ML
300.0000 mL | INJECTION | Status: DC
  Filled 2013-01-29: qty 300

## 2013-01-29 MED ORDER — ONDANSETRON HCL 4 MG/2ML IJ SOLN
INTRAMUSCULAR | Status: DC | PRN
  Administered 2013-01-29 (×2): 2 mg via INTRAMUSCULAR

## 2013-01-29 MED ORDER — HYDROMORPHONE HCL PF 1 MG/ML IJ SOLN
INTRAMUSCULAR | Status: DC | PRN
  Administered 2013-01-29 (×4): 0.5 mg via INTRAVENOUS

## 2013-01-29 MED ORDER — NALOXONE HCL 0.4 MG/ML IJ SOLN
INTRAMUSCULAR | Status: AC
  Administered 2013-01-29: 21:00:00
  Filled 2013-01-29: qty 1

## 2013-01-29 MED ORDER — SODIUM CHLORIDE 0.9 % IV SOLN
INTRAVENOUS | Status: DC
  Administered 2013-01-29 – 2013-02-01 (×3): via INTRAVENOUS

## 2013-01-29 MED ORDER — LACTATED RINGERS IV SOLN
INTRAVENOUS | Status: DC | PRN
  Administered 2013-01-29 (×3): via INTRAVENOUS

## 2013-01-29 MED ORDER — LACTATED RINGERS IV SOLN: INTRAVENOUS | Status: DC

## 2013-01-29 MED ORDER — DEXTROSE 5 % IV SOLN
2.0000 g | INTRAVENOUS | Status: AC
  Administered 2013-01-29: 2 g via INTRAVENOUS
  Filled 2013-01-29: qty 2

## 2013-01-29 MED ORDER — CISATRACURIUM BESYLATE (PF) 10 MG/5ML IV SOLN
INTRAVENOUS | Status: DC | PRN
  Administered 2013-01-29 (×2): 1 mg via INTRAVENOUS
  Administered 2013-01-29: 7 mg via INTRAVENOUS
  Administered 2013-01-29: 2 mg via INTRAVENOUS
  Administered 2013-01-29 (×2): 3 mg via INTRAVENOUS

## 2013-01-29 MED ORDER — IOHEXOL 300 MG/ML  SOLN
INTRAMUSCULAR | Status: DC | PRN
  Administered 2013-01-29: 12 mL via INTRAVENOUS

## 2013-01-29 MED ORDER — LIDOCAINE HCL (CARDIAC) 20 MG/ML IV SOLN
INTRAVENOUS | Status: DC | PRN
  Administered 2013-01-29: 30 mg via INTRAVENOUS

## 2013-01-29 MED ORDER — METOPROLOL TARTRATE 1 MG/ML IV SOLN
5.0000 mg | Freq: Four times a day (QID) | INTRAVENOUS | Status: DC | PRN
  Administered 2013-01-29: 5 mg via INTRAVENOUS

## 2013-01-29 MED ORDER — ASPIRIN 81 MG PO CHEW
81.0000 mg | CHEWABLE_TABLET | Freq: Every day | ORAL | Status: DC
  Administered 2013-01-29 – 2013-02-02 (×5): 81 mg via ORAL
  Filled 2013-01-29 (×6): qty 1

## 2013-01-29 MED ORDER — NALOXONE HCL 0.4 MG/ML IJ SOLN
0.4000 mg | Freq: Once | INTRAMUSCULAR | Status: AC
  Administered 2013-01-29: 0.4 mg via INTRAVENOUS
  Filled 2013-01-29: qty 1

## 2013-01-29 MED ORDER — 0.9 % SODIUM CHLORIDE (POUR BTL) OPTIME
TOPICAL | Status: DC | PRN
  Administered 2013-01-29: 3000 mL

## 2013-01-29 MED ORDER — HYDROMORPHONE HCL PF 1 MG/ML IJ SOLN
0.5000 mg | INTRAMUSCULAR | Status: DC | PRN
  Administered 2013-01-29: 1 mg via INTRAVENOUS
  Filled 2013-01-29: qty 1

## 2013-01-29 MED ORDER — BUPIVACAINE-EPINEPHRINE PF 0.25-1:200000 % IJ SOLN
INTRAMUSCULAR | Status: AC
  Filled 2013-01-29: qty 30

## 2013-01-29 MED ORDER — GLYCOPYRROLATE 0.2 MG/ML IJ SOLN
INTRAMUSCULAR | Status: DC | PRN
  Administered 2013-01-29: 0.4 mg via INTRAVENOUS
  Administered 2013-01-29 (×2): 0.2 mg via INTRAVENOUS

## 2013-01-29 MED ORDER — KETAMINE HCL 10 MG/ML IJ SOLN
INTRAMUSCULAR | Status: DC | PRN
  Administered 2013-01-29 (×4): 5 mg via INTRAVENOUS

## 2013-01-29 MED ORDER — FENTANYL CITRATE 0.05 MG/ML IJ SOLN
INTRAMUSCULAR | Status: DC | PRN
  Administered 2013-01-29 (×2): 25 ug via INTRAVENOUS
  Administered 2013-01-29 (×2): 50 ug via INTRAVENOUS
  Administered 2013-01-29: 25 ug via INTRAVENOUS
  Administered 2013-01-29: 50 ug via INTRAVENOUS
  Administered 2013-01-29: 25 ug via INTRAVENOUS
  Administered 2013-01-29: 75 ug via INTRAVENOUS
  Administered 2013-01-29: 25 ug via INTRAVENOUS
  Administered 2013-01-29 (×2): 50 ug via INTRAVENOUS

## 2013-01-29 MED ORDER — PROPOFOL 10 MG/ML IV BOLUS
INTRAVENOUS | Status: DC | PRN
  Administered 2013-01-29: 30 mg via INTRAVENOUS
  Administered 2013-01-29: 20 mg via INTRAVENOUS

## 2013-01-29 MED ORDER — FENTANYL CITRATE 0.05 MG/ML IJ SOLN
25.0000 ug | INTRAMUSCULAR | Status: DC | PRN
  Administered 2013-01-30 – 2013-01-31 (×5): 50 ug via INTRAVENOUS
  Administered 2013-02-01 – 2013-02-02 (×3): 25 ug via INTRAVENOUS
  Filled 2013-01-29 (×8): qty 2

## 2013-01-29 MED ORDER — DEXTROSE 5 % IV SOLN
2.0000 g | Freq: Two times a day (BID) | INTRAVENOUS | Status: AC
  Administered 2013-01-29: 2 g via INTRAVENOUS
  Filled 2013-01-29: qty 2

## 2013-01-29 MED ORDER — METHYLENE BLUE 1 % INJ SOLN
INTRAMUSCULAR | Status: DC | PRN
  Administered 2013-01-29: 5 mL

## 2013-01-29 MED ORDER — LACTATED RINGERS IR SOLN
Status: DC | PRN
  Administered 2013-01-29: 3000 mL

## 2013-01-29 MED ORDER — METOPROLOL TARTRATE 25 MG PO TABS
25.0000 mg | ORAL_TABLET | Freq: Two times a day (BID) | ORAL | Status: DC
  Administered 2013-01-30 – 2013-02-02 (×8): 25 mg via ORAL
  Filled 2013-01-29 (×11): qty 1

## 2013-01-29 MED ORDER — NALOXONE HCL 0.4 MG/ML IJ SOLN
INTRAMUSCULAR | Status: AC
  Filled 2013-01-29: qty 1

## 2013-01-29 MED ORDER — METOPROLOL TARTRATE 1 MG/ML IV SOLN
INTRAVENOUS | Status: AC
  Filled 2013-01-29: qty 5

## 2013-01-29 MED ORDER — METOPROLOL TARTRATE 50 MG PO TABS
50.0000 mg | ORAL_TABLET | Freq: Two times a day (BID) | ORAL | Status: DC
  Filled 2013-01-29: qty 1

## 2013-01-29 MED ORDER — ALVIMOPAN 12 MG PO CAPS
12.0000 mg | ORAL_CAPSULE | Freq: Once | ORAL | Status: AC
  Administered 2013-01-29: 12 mg via ORAL
  Filled 2013-01-29: qty 1

## 2013-01-29 MED ORDER — SACCHAROMYCES BOULARDII 250 MG PO CAPS
250.0000 mg | ORAL_CAPSULE | Freq: Two times a day (BID) | ORAL | Status: DC
  Administered 2013-01-29 – 2013-02-02 (×9): 250 mg via ORAL
  Filled 2013-01-29 (×11): qty 1

## 2013-01-29 MED ORDER — HEPARIN SODIUM (PORCINE) 5000 UNIT/ML IJ SOLN
5000.0000 [IU] | Freq: Three times a day (TID) | INTRAMUSCULAR | Status: DC
  Administered 2013-01-30 – 2013-02-03 (×13): 5000 [IU] via SUBCUTANEOUS
  Filled 2013-01-29 (×16): qty 1

## 2013-01-29 MED ORDER — SIMVASTATIN 40 MG PO TABS
40.0000 mg | ORAL_TABLET | Freq: Every evening | ORAL | Status: DC
  Administered 2013-01-30 – 2013-02-02 (×4): 40 mg via ORAL
  Filled 2013-01-29 (×5): qty 1

## 2013-01-29 MED ORDER — HYDRALAZINE HCL 20 MG/ML IJ SOLN
5.0000 mg | Freq: Once | INTRAMUSCULAR | Status: AC
  Administered 2013-01-29: 5 mg via INTRAVENOUS

## 2013-01-29 MED ORDER — HYDRALAZINE HCL 20 MG/ML IJ SOLN
INTRAMUSCULAR | Status: AC
  Filled 2013-01-29: qty 1

## 2013-01-29 MED ORDER — METOPROLOL TARTRATE 1 MG/ML IV SOLN
5.0000 mg | INTRAVENOUS | Status: DC | PRN
  Administered 2013-01-29 – 2013-01-31 (×2): 5 mg via INTRAVENOUS
  Filled 2013-01-29: qty 10
  Filled 2013-01-29 (×2): qty 5

## 2013-01-29 MED ORDER — EPHEDRINE SULFATE 50 MG/ML IJ SOLN
INTRAMUSCULAR | Status: DC | PRN
  Administered 2013-01-29: 5 mg via INTRAVENOUS
  Administered 2013-01-29: 10 mg via INTRAVENOUS
  Administered 2013-01-29: 2.5 mg via INTRAVENOUS
  Administered 2013-01-29: 7.5 mg via INTRAVENOUS

## 2013-01-29 MED ORDER — OXYCODONE HCL 10 MG PO TABS: 5.0000 mg | ORAL_TABLET | Freq: Four times a day (QID) | ORAL | Status: DC | PRN

## 2013-01-29 MED ORDER — HYDROMORPHONE HCL PF 1 MG/ML IJ SOLN
0.2500 mg | INTRAMUSCULAR | Status: DC | PRN
  Administered 2013-01-29 (×4): 0.5 mg via INTRAVENOUS

## 2013-01-29 MED ORDER — HYDRALAZINE HCL 20 MG/ML IJ SOLN: 10.0000 mg | Freq: Four times a day (QID) | INTRAMUSCULAR | Status: DC | PRN

## 2013-01-29 MED ORDER — KCL IN DEXTROSE-NACL 40-5-0.9 MEQ/L-%-% IV SOLN
INTRAVENOUS | Status: DC
  Filled 2013-01-29: qty 1000

## 2013-01-29 MED ORDER — PROPOFOL INFUSION 10 MG/ML OPTIME
INTRAVENOUS | Status: DC | PRN
  Administered 2013-01-29: 17 mL via INTRAVENOUS
  Administered 2013-01-29: 20 mL via INTRAVENOUS
  Administered 2013-01-29: 25 mL via INTRAVENOUS
  Administered 2013-01-29: 175 mL via INTRAVENOUS
  Administered 2013-01-29: 30 mL via INTRAVENOUS

## 2013-01-29 MED ORDER — PROMETHAZINE HCL 25 MG/ML IJ SOLN
6.2500 mg | Freq: Four times a day (QID) | INTRAMUSCULAR | Status: DC | PRN
  Administered 2013-01-29: 6.25 mg via INTRAVENOUS
  Filled 2013-01-29 (×2): qty 1

## 2013-01-29 MED ORDER — ZOLPIDEM TARTRATE 5 MG PO TABS
5.0000 mg | ORAL_TABLET | Freq: Every evening | ORAL | Status: DC | PRN
  Administered 2013-01-31: 5 mg via ORAL
  Filled 2013-01-29: qty 1

## 2013-01-29 MED ORDER — ALVIMOPAN 12 MG PO CAPS
12.0000 mg | ORAL_CAPSULE | Freq: Two times a day (BID) | ORAL | Status: DC
  Administered 2013-01-30 – 2013-02-02 (×7): 12 mg via ORAL
  Filled 2013-01-29 (×8): qty 1

## 2013-01-29 MED ORDER — BUPIVACAINE-EPINEPHRINE 0.25% -1:200000 IJ SOLN
INTRAMUSCULAR | Status: DC | PRN
  Administered 2013-01-29: 50 mL

## 2013-01-29 MED ORDER — ADULT MULTIVITAMIN W/MINERALS CH
1.0000 | ORAL_TABLET | Freq: Every day | ORAL | Status: DC
  Administered 2013-01-29 – 2013-02-02 (×5): 1 via ORAL
  Filled 2013-01-29 (×6): qty 1

## 2013-01-29 MED ORDER — NALOXONE HCL 0.4 MG/ML IJ SOLN
0.4000 mg | Freq: Once | INTRAMUSCULAR | Status: AC
  Administered 2013-01-29: 0.4 mg via INTRAVENOUS

## 2013-01-29 MED ORDER — BUPIVACAINE 0.25 % ON-Q PUMP DUAL CATH 300 ML: 300.0000 mL | INJECTION | Status: DC

## 2013-01-29 MED ORDER — STERILE WATER FOR IRRIGATION IR SOLN
Status: DC | PRN
  Administered 2013-01-29: 3000 mL

## 2013-01-29 MED ORDER — ALUM & MAG HYDROXIDE-SIMETH 200-200-20 MG/5ML PO SUSP: 30.0000 mL | Freq: Four times a day (QID) | ORAL | Status: DC | PRN

## 2013-01-29 MED ORDER — SUCCINYLCHOLINE CHLORIDE 20 MG/ML IJ SOLN
INTRAMUSCULAR | Status: DC | PRN
  Administered 2013-01-29: 100 mg via INTRAVENOUS

## 2013-01-29 MED ORDER — DIPHENHYDRAMINE HCL 12.5 MG/5ML PO ELIX: 12.5000 mg | ORAL_SOLUTION | Freq: Four times a day (QID) | ORAL | Status: DC | PRN

## 2013-01-29 MED ORDER — HYDRALAZINE HCL 20 MG/ML IJ SOLN
INTRAMUSCULAR | Status: DC | PRN
  Administered 2013-01-29: 5 mg via INTRAVENOUS

## 2013-01-29 MED ORDER — MIDAZOLAM HCL 5 MG/5ML IJ SOLN
INTRAMUSCULAR | Status: DC | PRN
  Administered 2013-01-29: 0.5 mg via INTRAVENOUS

## 2013-01-29 SURGICAL SUPPLY — 94 items
ADAPTER GOLDBERG URETERAL (ADAPTER) ×1 IMPLANT
ADPR CATH 15X14FR FL DRN BG (ADAPTER) ×3
APPLIER CLIP 5 13 M/L LIGAMAX5 (MISCELLANEOUS)
APPLIER CLIP ROT 10 11.4 M/L (STAPLE)
APR CLP MED LRG 11.4X10 (STAPLE)
APR CLP MED LRG 5 ANG JAW (MISCELLANEOUS)
BAG URINE DRAINAGE (UROLOGICAL SUPPLIES) ×1 IMPLANT
BAG URO CATCHER STRL LF (DRAPE) ×4 IMPLANT
BLADE EXTENDED COATED 6.5IN (ELECTRODE) ×4 IMPLANT
BLADE HEX COATED 2.75 (ELECTRODE) ×8 IMPLANT
BLADE SURG SZ10 CARB STEEL (BLADE) ×5 IMPLANT
CABLE HIGH FREQUENCY MONO STRZ (ELECTRODE) ×4 IMPLANT
CANISTER SUCTION 2500CC (MISCELLANEOUS) ×5 IMPLANT
CATH INTERMIT  6FR 70CM (CATHETERS) ×2 IMPLANT
CATH KIT ON Q 7.5IN SLV (PAIN MANAGEMENT) ×2 IMPLANT
CATH KIT ON-Q SILVERSOAK 7.5 (CATHETERS) IMPLANT
CATH KIT ON-Q SILVERSOAK 7.5IN (CATHETERS) ×8 IMPLANT
CATH TIEMANN FOLEY 18FR 5CC (CATHETERS) ×1 IMPLANT
CELLS DAT CNTRL 66122 CELL SVR (MISCELLANEOUS) IMPLANT
CLIP APPLIE 5 13 M/L LIGAMAX5 (MISCELLANEOUS) IMPLANT
CLIP APPLIE ROT 10 11.4 M/L (STAPLE) IMPLANT
CLOTH BEACON ORANGE TIMEOUT ST (SAFETY) ×3 IMPLANT
COUNTER NEEDLE 20 DBL MAG RED (NEEDLE) ×1 IMPLANT
COVER MAYO STAND STRL (DRAPES) ×8 IMPLANT
DECANTER SPIKE VIAL GLASS SM (MISCELLANEOUS) ×4 IMPLANT
DRAIN CHANNEL 19F RND (DRAIN) IMPLANT
DRAPE CAMERA CLOSED 9X96 (DRAPES) ×5 IMPLANT
DRAPE LAPAROSCOPIC ABDOMINAL (DRAPES) ×4 IMPLANT
DRAPE LG THREE QUARTER DISP (DRAPES) ×5 IMPLANT
DRAPE UTILITY XL STRL (DRAPES) ×8 IMPLANT
DRAPE WARM FLUID 44X44 (DRAPE) ×4 IMPLANT
DRSG OPSITE POSTOP 4X10 (GAUZE/BANDAGES/DRESSINGS) IMPLANT
DRSG OPSITE POSTOP 4X6 (GAUZE/BANDAGES/DRESSINGS) ×1 IMPLANT
DRSG OPSITE POSTOP 4X8 (GAUZE/BANDAGES/DRESSINGS) IMPLANT
DRSG TEGADERM 2-3/8X2-3/4 SM (GAUZE/BANDAGES/DRESSINGS) ×9 IMPLANT
DRSG TEGADERM 4X4.75 (GAUZE/BANDAGES/DRESSINGS) ×4 IMPLANT
ELECT REM PT RETURN 9FT ADLT (ELECTROSURGICAL) ×4
ELECTRODE REM PT RTRN 9FT ADLT (ELECTROSURGICAL) ×3 IMPLANT
ENDOLOOP SUT PDS II  0 18 (SUTURE) ×1
ENDOLOOP SUT PDS II 0 18 (SUTURE) IMPLANT
GLOVE BIOGEL M STRL SZ7.5 (GLOVE) ×8 IMPLANT
GLOVE ECLIPSE 8.0 STRL XLNG CF (GLOVE) ×12 IMPLANT
GLOVE INDICATOR 8.0 STRL GRN (GLOVE) ×8 IMPLANT
GOWN PREVENTION PLUS LG XLONG (DISPOSABLE) ×7 IMPLANT
GOWN STRL REIN XL XLG (GOWN DISPOSABLE) ×22 IMPLANT
KIT BASIN OR (CUSTOM PROCEDURE TRAY) ×5 IMPLANT
LEGGING LITHOTOMY PAIR STRL (DRAPES) ×4 IMPLANT
MANIFOLD NEPTUNE II (INSTRUMENTS) ×3 IMPLANT
PACK CYSTO (CUSTOM PROCEDURE TRAY) ×4 IMPLANT
PENCIL BUTTON HOLSTER BLD 10FT (ELECTRODE) ×8 IMPLANT
PIN HALF 6X45 (PIN) ×1 IMPLANT
PUMP PAIN ON-Q (MISCELLANEOUS) IMPLANT
RETRACTOR WND ALEXIS 18 MED (MISCELLANEOUS) IMPLANT
RTRCTR WOUND ALEXIS 18CM MED (MISCELLANEOUS)
SCISSORS LAP 5X35 DISP (ENDOMECHANICALS) ×4 IMPLANT
SEALER TISSUE G2 CVD JAW 35 (ENDOMECHANICALS) IMPLANT
SEALER TISSUE G2 CVD JAW 45CM (ENDOMECHANICALS)
SEALER TISSUE G2 STRG ARTC 35C (ENDOMECHANICALS) ×1 IMPLANT
SET IRRIG TUBING LAPAROSCOPIC (IRRIGATION / IRRIGATOR) ×4 IMPLANT
SET IRRIG Y TYPE TUR BLADDER L (SET/KITS/TRAYS/PACK) ×1 IMPLANT
SLEEVE XCEL OPT CAN 5 100 (ENDOMECHANICALS) ×9 IMPLANT
SPONGE GAUZE 4X4 12PLY (GAUZE/BANDAGES/DRESSINGS) ×3 IMPLANT
SPONGE LAP 18X18 X RAY DECT (DISPOSABLE) ×8 IMPLANT
STAPLER CIRC ILS CVD 33MM 37CM (STAPLE) ×1 IMPLANT
STAPLER CUT CVD 40MM BLUE (STAPLE) ×1 IMPLANT
STAPLER VISISTAT 35W (STAPLE) ×4 IMPLANT
SUCTION POOLE TIP (SUCTIONS) ×4 IMPLANT
SUT MNCRL AB 4-0 PS2 18 (SUTURE) ×4 IMPLANT
SUT PDS AB 1 CTX 36 (SUTURE) ×2 IMPLANT
SUT PDS AB 1 TP1 96 (SUTURE) IMPLANT
SUT PROLENE 0 CT 2 (SUTURE) IMPLANT
SUT SILK 2 0 (SUTURE) ×4
SUT SILK 2 0 SH CR/8 (SUTURE) ×4 IMPLANT
SUT SILK 2-0 18XBRD TIE 12 (SUTURE) ×3 IMPLANT
SUT SILK 3 0 (SUTURE) ×4
SUT SILK 3 0 SH CR/8 (SUTURE) ×4 IMPLANT
SUT SILK 3-0 18XBRD TIE 12 (SUTURE) ×3 IMPLANT
SUT VIC AB 2-0 SH 27 (SUTURE) ×8
SUT VIC AB 2-0 SH 27X BRD (SUTURE) IMPLANT
SUT VIC AB 2-0 UR6 27 (SUTURE) ×2 IMPLANT
SYR BULB IRRIGATION 50ML (SYRINGE) ×1 IMPLANT
SYS LAPSCP GELPORT 120MM (MISCELLANEOUS) ×4
SYSTEM LAPSCP GELPORT 120MM (MISCELLANEOUS) IMPLANT
TAPE UMBILICAL COTTON 1/8X30 (MISCELLANEOUS) ×1 IMPLANT
TOWEL OR 17X26 10 PK STRL BLUE (TOWEL DISPOSABLE) ×8 IMPLANT
TOWEL OR NON WOVEN STRL DISP B (DISPOSABLE) ×8 IMPLANT
TRAY FOLEY CATH 14FRSI W/METER (CATHETERS) ×3 IMPLANT
TRAY LAP CHOLE (CUSTOM PROCEDURE TRAY) ×4 IMPLANT
TROCAR BLADELESS OPT 5 100 (ENDOMECHANICALS) ×4 IMPLANT
TROCAR XCEL NON-BLD 11X100MML (ENDOMECHANICALS) ×3 IMPLANT
TUBING CONNECTING 10 (TUBING) ×7 IMPLANT
TUBING FILTER THERMOFLATOR (ELECTROSURGICAL) ×4 IMPLANT
TUNNELER SHEATH ON-Q 16GX12 DP (PAIN MANAGEMENT) ×1 IMPLANT
YANKAUER SUCT BULB TIP 10FT TU (MISCELLANEOUS) ×8 IMPLANT

## 2013-01-29 NOTE — Op Note (Signed)
01/29/2013  12:27 PM  PATIENT:  [name]  72 y.o. male  Patient Care Team: Gabriel Cirri as PCP - General (Family Medicine) Bjorn Pippin, MD as Consulting Physician (Urology) Vertell Novak., MD as Consulting Physician (Gastroenterology) Ardeth Sportsman, MD as Consulting Physician (General Surgery)  PRE-OPERATIVE DIAGNOSIS:  diverticulitis with colo vesicle and colocutaneous fistula   POST-OPERATIVE DIAGNOSIS:  diverticulitis with colovesicle and colocutaneous fistula   PROCEDURE:  Procedure(s): LYSIS OF ADHESIONS SPLENIC FLEXURE MOBILIZATION LAPAROSCOPIC SIGMOID COLECTOMY OMENTOPEXY of bladder at colovesical fistula RIGID PROCTOSCOPY   SURGEON:  Surgeon(s): Ardeth Sportsman, MD Antony Haste, MD Shelly Rubenstein, MD - assit  ANESTHESIA:   local and general.  OnQ  EBL:  Total I/O In: 2450 [I.V.:2450] Out: 525 [Urine:500; Blood:25]  Delay start of Pharmacological VTE agent (>24hrs) due to surgical blood loss or risk of bleeding:  no  DRAINS: none   SPECIMEN:  Source of Specimen:  Rectosigmoid colon.  Open end proximal (distal descending colon)  DISPOSITION OF SPECIMEN:  PATHOLOGY  COUNTS:  YES  PLAN OF CARE: Admit to inpatient   PATIENT DISPOSITION:  PACU - hemodynamically stable.  INDICATION:    Gentleman with recurrent diverticulitis.  Developed severe tach with abscess.  Drained.  Developed a fistula to drain as well as to bladder.  Then placed on antibiotics.  It is go down.  Colonoscopy yesterday revealed persistent diverticulitis about 40 cm from anal verge.  I recommended segmental resection:  The anatomy & physiology of the digestive tract was discussed.  The pathophysiology was discussed.  Natural history risks without surgery was discussed.   I worked to give an overview of the disease and the frequent need to have multispecialty involvement.  I feel the risks of no intervention will lead to serious problems that outweigh the  operative risks; therefore, I recommended a partial colectomy to remove the pathology.  Laparoscopic & open techniques were discussed.   Risks such as bleeding, infection, abscess, leak, reoperation, possible ostomy, hernia, heart attack, death, and other risks were discussed.  I noted a good likelihood this will help address the problem.   Goals of post-operative recovery were discussed as well.  We will work to minimize complications.  Educational materials on the pathology had been given in the office.  Questions were answered.    The patient expressed understanding & wished to proceed with surgery.  OR FINDINGS:   Patient had And very inflamed region of colon at the descending/sigmoid junction.  Densely inherent to the anterior pelvic rim and left anterolateral bladder.  No obvious metastatic disease on visceral parietal peritoneum or liver.  The anastomosis rests 15 cm from the anal verge by rigid proctoscopy.  DESCRIPTION:   Informed consent was confirmed.  The patient underwent general anaesthesia without difficulty.  The patient was positioned appropriately.  VTE prevention in place.  The patient's abdomen was clipped, prepped, & draped in a sterile fashion.  Surgical timeout confirmed our plan.  Dr. Mena Goes with urology performed cystoscopy and ureteral stent placements.  Please see his note for further details.  The patient was positioned in reverse Trendelenburg.  Abdominal entry was gained using optical entry technique in the right upper abdomen.  Entry was clean.  I induced carbon dioxide insufflation.  Camera inspection revealed no injury.  Extra ports were carefully placed under direct laparoscopic visualization.  Patient had dense omental adhesions to the descending/sigmoid colon.  These were carefully freed off using the bipolar EnSeal system.  I reflected the greater omentum and the upper abdomen the small bowel in the upper abdomen. I scored the base of peritoneum of the right  side of the mesentery of the left colon from the ligament of Treitz to the peritoneal reflection of the mid rectum.  His sigmoid and left colon mesentery was quite thinned out.  I elevated the sigmoid mesentery and enetered into the retro-mesenteric plane. We were able to identify the left ureter with its stent and gonadal vessels. We kept those posterior within the retroperitoneum and elevated the left colon mesentery off that. I did isolate the IMA pedicle but did not ligate it yet.  I continued distally and got into the avascular plane posterior to the mesorectum. This allowed me to help mobilize the rectum as well by freeing the mesorectum off the sacrum.  I mobilized the peritoneal coverings towards the peritoneal reflection on both the right and left sides of the rectum.  I could see the right and left ureters and stayed away from them.  I kept the lateral vascular pedicles to the rectum intact.  I skeletonized the lymph nodes off the inferior mesenteric artery pedicle.  I went down to its takeoff from the aorta.  I isolated the inferior mesenteric vein off of the ligament of Treitz just cephalad to that as well.  After confirming the left ureter was out of the way, I went ahead and ligated the inferior mesenteric artery pedicle with bipolar EnSeal just near its takeoff from the aorta.  I reinforced ligation with a 0 PDS Endoloop.  I did ligate the inferior mesenteric vein in a similar fashion.  We ensured hemostasis. I skeletonized the mesorectum at the junction at the proximal rectum using blunt dissection & bipolar EnSeal.  I freed the omental adhesions to the anterior abdominal wall and ascending colon to help mobilize the colon some more as well.  I mobilized the left colon in a lateral to medial fashion off the line of Toldt up towards the splenic flexure to ensure good mobilization of the left colon to reach into the pelvis.  There is still difficulty with mobility.  Therefore I did mobilization of  the splenic flexure in a lateral to medial fashion.  I freed its adhesions to the lateral side wall and the retroperitoneal adhesions to the kidney and inferior pancreatic adhesions.  With that I got much more mobility down into the pelvis.  I placed a GelPort as a wound protector through a Pfannenstiel incision in the suprapubic region, taking care to avoid bladder injury. I transected bowel at the proximal rectum using a contour stapler. I was able to eviscerate the rectosigmoid and descending colon out the wound.  I chose a region at the descending/sigmoid junction that was soft and easily reached down. I clamped the colon proximal to this area using a soft bowel clamp. I transected at the descending/sigmoid junction with a scalpel. I got healthy bleeding mucosa. I transected the remaining specimen mesentery in a radial fashion to preserve good blood supply to the proximal colon end.  We sent the rectosigmoid colon specimen off to go to pathology.  We sized the colon orifice.  There is a diverticular near the transection, side trimmed a little more proximally until there were no diverticuli for several centimeters.  I chose a 33 EEA anvil stapler system. I placed the anvil to the open end of the descending colon and closed around it using a 0 Prolene pursestring.  We did copious irrigation  with crystalloid solution.  Hemostasis was good.  The distal end of the colon at the handle easily reached down to the rectal stump.     Dr Magnus Ivan scrubbed down and did gentle anal dilation and advanced the EEA stapler up the rectal stump. The spike was brought out at the provimal end of the rectal stump under direct visualization.  I attached the anvil of the proximal colon the spike of the stapler. Anvil was tightened down and held clamped for 60 seconds. The EEA stapler was fired and held clamped for 30 seconds. The stapler was released & removed. We noted 2 excellent anastomotic rings. Blue stitch is in the proximal  ring.  Dr. Magnus Ivan did rigid proctoscopy noted the anastomosis was at 15 cm from the anal verge consistent with the proximal rectum.  We did a final irrigation of antibiotic solution (900 mg clindamycin/240 mg gentamicin in a liter of crystalloid) & held that for the pelvic air leak test .  The rectum was insufflated the rectum while clamping the colon proximal to that anastomosis.  There was a negative air leak test. There was no tension of mesentery or bowel at the anastomosis.   Tissues looked viable.    We changed gloves.  We did diagnostic laparoscopy.  We aspirated the antibiotic irrigation.  Hemostasis was good.   Ureters & bowel uninjured.  The anastomosis looked healthy.  We fill the bladder with methylene blue sterile solution.  The bladder distended well.  We cannot prove any leak point on the dome of the bladder.  Bladder is decompressed.  I did decide to patch that area of the bladder since it was still rather inflamed.  We were able to get the greater omentum to reach down to the pelvis easily.  I took a healthy tongue of omentum and pexied into the bladder using 2-0 Vicryl interrupted stitches x3 in a classic Graham patch.  We changed gown and gloves.  The patient was re-draped.  Sterile unused instruments were used from this point out per colon SSI prevention protocol.   I placed On-Q catheter and sheaths into the preperitoneal space under direct palpation.  I removed CO2 gas out through the ports.  I closed the 5mm port sites using Monocryl stitch and sterile dressing.  I closed the Pfannenstiel wound using a 0 Vicryl vertical peritoneal closure and a #1 PDS transverse anterior rectal fascial closure. I closed the skin with some interrupted Monocryl stitches. I placed antibiotic-soaked wicks into the closure at the corners & centrally x4 between those areas. I placed a sterile dressing.  OnQ catheters placed & sheaths peeled away.  Patient is extubated in the recovery room. I discussed postop  care with the patient in detail the office & in the holding area. Instructions are written. I updated the patient's status to the family.  Recommendations were made.  Questions were answered.  The family expressed understanding & appreciation.

## 2013-01-29 NOTE — Anesthesia Preprocedure Evaluation (Addendum)
Anesthesia Evaluation  Patient identified by MRN, date of birth, ID band Patient awake    Reviewed: Allergy & Precautions, H&P , NPO status , Patient's Chart, lab work & pertinent test results  Airway Mallampati: III TM Distance: >3 FB Neck ROM: Full    Dental no notable dental hx. (+) Edentulous Upper and Edentulous Lower   Pulmonary neg pulmonary ROS, COPDCurrent Smoker,  breath sounds clear to auscultation  Pulmonary exam normal       Cardiovascular hypertension, Pt. on home beta blockers and Pt. on medications + CAD (mild on cath 2002) and + Peripheral Vascular Disease negative cardio ROS  Rhythm:Regular Rate:Normal  Subclavian steal syndrome Normal nuclear stress 2010 with EF 63%   Neuro/Psych negative neurological ROS  negative psych ROS   GI/Hepatic negative GI ROS, Neg liver ROS,   Endo/Other  negative endocrine ROSdiabetes, Type 2, Oral Hypoglycemic Agents  Renal/GU negative Renal ROS  negative genitourinary   Musculoskeletal negative musculoskeletal ROS (+)   Abdominal   Peds negative pediatric ROS (+)  Hematology negative hematology ROS (+)   Anesthesia Other Findings   Reproductive/Obstetrics negative OB ROS                          Anesthesia Physical Anesthesia Plan  ASA: III  Anesthesia Plan: General   Post-op Pain Management:    Induction: Intravenous  Airway Management Planned: Oral ETT  Additional Equipment:   Intra-op Plan:   Post-operative Plan: Extubation in OR  Informed Consent: I have reviewed the patients History and Physical, chart, labs and discussed the procedure including the risks, benefits and alternatives for the proposed anesthesia with the patient or authorized representative who has indicated his/her understanding and acceptance.   Dental advisory given  Plan Discussed with: CRNA  Anesthesia Plan Comments:         Anesthesia Quick  Evaluation

## 2013-01-29 NOTE — Telephone Encounter (Signed)
Updated wife

## 2013-01-29 NOTE — H&P (Signed)
JONAH GINGRAS  1941-02-11 [id number]  CARE TEAM:  PCP: Gabriel Cirri DO  Outpatient Care Team: Patient Care Team: Gabriel Cirri as PCP - General (Family Medicine) Bjorn Pippin, MD as Consulting Physician (Urology) Vertell Novak., MD as Consulting Physician (Gastroenterology) Ardeth Sportsman, MD as Consulting Physician (General Surgery)  Inpatient Treatment Team: Treatment Team: Attending Provider: Ardeth Sportsman, MD  This patient is a 72 y.o.male who presents today for surgical evaluation.   Followup on chronic diverticular abscess developing into fistula to bladder and drain  The patient is a pleasant but feisty elderly gentleman. He has had attacks of abdominal pain presumed to be diverticulitis for decades. He has never had a colonoscopy. Apparently, his sister died of a perforation on colonoscopy that was diagnosed to light. Diet wake [employer]. Therefore, he has refused colonoscopy. Patient had abdominal pain. Was admitted with a pelvic abscess presumed to be do to diverticulitis. Surgical consultation made. Percutaneous drain done. Stabilized and was on home with a drain. Followup CAT scan showed resolution of the abscess. Drain was removed. However, the patient began to have persistent drainage from the drain site of pus. Came into our office urgently. Was drained and packed. Seen again. Was given oral Cipro antibiotics. That is usually the antibiotic treated for the diverticulitis that has worked in the past. At least 7-10 day courses.   The patient developed a recurrent abscess. A drain was replaced area and the drainage output has been less than 10 mL a day but it is green/brown. His energy level is not normal but a little better. He is wanting to mow the yard. His appetite is much better. His wife is concerned that he has not been sleeping well and has stretched/depressed. No nausea or vomiting. Patient wishes to have surgery done soon. He has seen a  gastroenterologist and had a colonoscopy yesterday.   Dr. Randa Evens called and discuss with me.  Clean from the anus up to 40 cm.  Then a very hard inflamed strictured areas strongly suspicious for active on chronic diverticulitis.  No frank fistulas noted.  Very difficult to instrument proximal to that, so he held off.  Patient seemed comfortable after colonoscopy.  Is completing oral antibiotics for his surgery.  No new events.  The hematuria or resolve quickly and has been absent for the past two weeks.  Drainage output has become more than and minimal as well.  He is tired of the drain.  He wants to get it out.     Past Medical History  Diagnosis Date  . Diabetes mellitus   . Hyperlipidemia     Is on statin therapy  . Diverticulitis [id number]    "all my life"  . Normal nuclear stress test 2010    EF 63% with no ischemia  . Skin cancer   . Bladder cancer   . Hypertension     LOV  Dr Shirlee Latch with clearance 05/08/11 and EKG  in [facility]   . CAD (coronary artery disease)     Mild per cath in 2002/last nuclear stress per note  2010  . Colonoscopy refused     Sister died from delayed Dx of post-colonoscopy   . Subclavian steal syndrome     left; evaluated by [name]; managed medically , no accurate B/p left arm    Past Surgical History  Procedure Laterality Date  . Cardiac catheterization  2002    He had a which showed mild coronary atherosclerosis and normal left  ventricular function.  . Rotator cuff repair      right  . Tonsillectomy    . Appendectomy    . Cystoscopy with injection      cystoscopy with BCG injections  . Transurethral resection of prostate  05/17/2011    Procedure: TRANSURETHRAL RESECTION OF THE PROSTATE WITH GYRUS INSTRUMENTS;  Surgeon: Anner Crete, MD;  Location: [facility];  Service: Urology;  Laterality: N/A;  . Peritoneal wound drainage  01-23-13    implant of abdomianl drain  . Cataract extraction, bilateral Bilateral 01-23-13    11'13    History   Social  History  . Marital Status: Married    Spouse Name: N/A    Number of Children: N/A  . Years of Education: N/A   Occupational History  . Not on file.   Social History Main Topics  . Smoking status: Former Smoker -- 55 years    Types: Pipe    Quit date: 05/14/1955  . Smokeless tobacco: Former Neurosurgeon  . Alcohol Use: No  . Drug Use: No  . Sexual Activity: Yes   Other Topics Concern  . Not on file   Social History Narrative  . No narrative on file    Family History  Problem Relation Age of Onset  . Hypertension Mother   . Lung cancer Mother     Current Facility-Administered Medications  Medication Dose Route Frequency Provider Last Rate Last Dose  . bupivacaine 0.25 % ON-Q pump DUAL CATH 300 mL  300 mL Other Continuous Ardeth Sportsman, MD      . cefoTEtan (CEFOTAN) 2 g in dextrose 5 % 50 mL IVPB  2 g Intravenous On Call to OR Ardeth Sportsman, MD      . clindamycin (CLEOCIN) 900 mg, gentamicin (GARAMYCIN) 240 mg in sodium chloride 0.9 % 1,000 mL for intraperitoneal lavage   Intraperitoneal To OR Ardeth Sportsman, MD      . fluconazole (DIFLUCAN) IVPB 200 mg  200 mg Intravenous On Call to OR Ardeth Sportsman, MD         No Known Allergies  ROS: Constitutional:  No fevers, chills, sweats.  Weight down overall but stabilized Eyes:  No vision changes, No discharge HENT:  No sore throats, nasal drainage Lymph: No neck swelling, No bruising easily Pulmonary:  No cough, productive sputum CV: No orthopnea, PND  Patient walks 20 minutes for about 1/4 miles without difficulty.  No exertional chest/neck/shoulder/arm pain. GI: No personal nor family history of GI/colon cancer, inflammatory bowel disease, irritable bowel syndrome, allergy such as Celiac Sprue, dietary/dairy problems, colitis, ulcers nor gastritis.  No recent sick contacts/gastroenteritis.  No travel outside the country.  No changes in diet. Renal: No UTIs, No hematuria. +dysuria Genital:  No drainage, bleeding,  masses Musculoskeletal: No severe joint pain.  Good ROM major joints Skin:  No sores or lesions.  No rashes Heme/Lymph:  No easy bleeding.  No swollen lymph nodes Neuro: No focal weakness/numbness.  No seizures Psych: No suicidal ideation.  No hallucinations  BP 157/85  Pulse 51  Temp(Src) 97.5 F (36.4 C) (Oral)  Resp 18  SpO2 99%  Physical Exam: General: Pt awake/alert/oriented x4 in no major acute distress.  Thin Eyes: PERRL, normal EOM. Sclera nonicteric Neuro: CN II-XII intact w/o focal sensory/motor deficits. Lymph: No head/neck/groin lymphadenopathy Psych:  No delerium/psychosis/paranoia HENT: Normocephalic, Mucus membranes moist.  No thrush Neck: Supple, No tracheal deviation Chest: No pain.  Good respiratory excursion. CV:  Pulses intact.  Regular rhythm Abdomen: Soft, Nondistended.  Mild tender suprapubic - better.  No incarcerated hernias. Ext:  SCDs BLE.  No significant edema.  No cyanosis Skin: No petechiae / purpurea.  No major sores Musculoskeletal: No severe joint pain.  Good ROM major joints   Results:   Labs: Results for orders placed during the hospital encounter of 01/29/13 (from the past 48 hour(s))  GLUCOSE, CAPILLARY     Status: Abnormal   Collection Time    01/29/13  6:38 AM      Result Value Range   Glucose-Capillary 100 (*) 70 - 99 mg/dL   Comment 1 Documented in Chart      Imaging / Studies: Dg Chest 2 View  01/23/2013   CLINICAL DATA:  Preop for colectomy for diverticulitis.  EXAM: CHEST  2 VIEW  COMPARISON:  05/14/2011  FINDINGS: Lungs are hyperinflated, consistent with emphysematous changes. The cardiomediastinal silhouette is unchanged. No focal infiltrate or edema. No pleural effusion or pneumothorax. No acute osseous abnormality.  IMPRESSION: No active cardiopulmonary disease.   Electronically Signed   By: Jerene Dilling M.D.   On: 01/23/2013 09:38    Medications / Allergies: per chart  Antibiotics: Anti-infectives   Start      Dose/Rate Route Frequency Ordered Stop   01/29/13 0960  cefoTEtan (CEFOTAN) 2 g in dextrose 5 % 50 mL IVPB     2 g 100 mL/hr over 30 Minutes Intravenous On call to O.R. 01/29/13 4540 01/30/13 0559   01/29/13 0600  fluconazole (DIFLUCAN) IVPB 200 mg     200 mg 100 mL/hr over 60 Minutes Intravenous On call to O.R. 01/28/13 1741 01/30/13 0559   01/29/13 0600  clindamycin (CLEOCIN) 900 mg, gentamicin (GARAMYCIN) 240 mg in sodium chloride 0.9 % 1,000 mL for intraperitoneal lavage      Intraperitoneal To Surgery 01/28/13 1745 01/30/13 0600   01/28/13 1745  clindamycin (CLEOCIN) 900 mg, gentamicin (GARAMYCIN) 240 mg in sodium chloride 0.9 % 1,000 mL for intraperitoneal lavage  Status:  Discontinued    Comments:  Pharmacy may adjust dosing strength, schedule, rate of infusion, etc as needed to optimize therapy   1 application Intraperitoneal To Surgery 01/28/13 1741 01/28/13 1744      Assessment  Alamin Delrae Rend  72 y.o. male  Day of Surgery  Procedure(s): LAPAROSCOPIC SIGMOID COLECTOMY  rigid proctoscopy  repair colovesical fustula  BILATERAL STENT PLACEMENT  Problem List:  Principal Problem:   Colocutaneous fistula - diverticular Active Problems:   Bradycardia   CAD (coronary artery disease)   HTN (hypertension)   Smoking   Colonic diverticular abscess s/p perc drainage x2   Diabetes mellitus   Diverticulitis - recurrent   Colovesical fistula - diverticular   Persistent sigmoid diverticulitis with recurrent abscess Status post second drainage. Abscess resolved but now developing chronic fistulas to drain and bladder  Plan:  Segmental colectomy contain a chronic diverticulitis with abscess.  Preoperative ureteral stent placement.  Probable bladder repair as well as a fistula the bladder.  Dr. Lennox Laity with [facility] to help.  Will start out laparoscopically and see what can be accomplished this way:  The anatomy & physiology of the digestive tract was discussed.  The  pathophysiology was discussed.  Natural history risks without surgery was discussed.   I feel the risks of no intervention will lead to serious problems that outweigh the operative risks; therefore, I recommended a partial colectomy to remove the pathology.  Laparoscopic & open techniques were discussed.   Risks  such as bleeding, infection, abscess, leak, reoperation, possible ostomy, hernia, heart attack, death, and other risks were discussed.  I noted a good likelihood this will help address the problem.   Goals of post-operative recovery were discussed as well.  We will work to minimize complications.  An educational handout on the pathology was given as well.  Questions were answered.  The patient expresses understanding & wishes to proceed with surgery.      Ardeth Sportsman, M.D., F.A.C.S. Gastrointestinal and Minimally Invasive Surgery [facility] [address] [place], [place] [address] [phone] Main / Paging   01/29/2013

## 2013-01-29 NOTE — Progress Notes (Signed)
CARE MANAGEMENT NOTE 01/29/2013  Patient:  [name], [name]   Account Number:  [id number]  Date Initiated:  01/29/2013  Documentation initiated by:  DAVIS,RHONDA  Subjective/Objective Assessment:   diverticulitis with colovesicle and colocutaneous fistula  LYSIS OF ADHESIONS  SPLENIC FLEXURE MOBILIZATION  LAPAROSCOPIC SIGMOID COLECTOMY  OMENTOPEXY of bladder at colovesical fistula  RIGID PROCTOSCOPY     Action/Plan:   home may neede hhc will follow   Anticipated DC Date:  02/01/2013   Anticipated DC Plan:  HOME W HOME HEALTH SERVICES  In-house referral  NA      DC Planning Services  NA      [facility] Choice  NA   Choice offered to / List presented to:  NA   DME arranged  NA      DME agency  NA     HH arranged  NA      HH agency  NA   Status of service:  In process, will continue to follow Medicare Important Message given?  NA - LOS <3 / Initial given by admissions (If response is "NO", the following Medicare IM given date fields will be blank) Date Medicare IM given:   Date Additional Medicare IM given:    Discharge Disposition:    Per UR Regulation:  Reviewed for med. necessity/level of care/duration of stay  If discussed at Long Length of Stay Meetings, dates discussed:    Comments:  Rhonda Davis,RN,BSN,CCM chart reviewed patient is operative day for mutiple complicated surgeries and post op care.

## 2013-01-29 NOTE — Op Note (Signed)
Preop diagnosis: Diverticular pelvic abscess, colovesical fistula, history bladder cancer, BPH Postop diagnosis: Same  Procedure: Exam under anesthesia Cystoscopy Bilateral retrograde pyelogram Bilateral ureteral stent placement Foley catheter placement  Surgeon: Mena Goes  Anesthesia: Fortune  Type of anesthesia: Gen.  Findings: On exam under anesthesia the penis and testicles were normal without mass, lesion or hard area. On digital rectal exam the prostate showed mild BPH but there were no concerning hard areas or nodules. There was no palpable bladder or abdominal mass.  On cystoscopy the urethra appeared normal. There was a wide caliber stricture near the membranous urethra it was dilated with the scope. The prostatic urethra was patent with prior TURP. The trigone was close to the bladder neck due to the prior TURP but was otherwise normal. The ureteral orifices were in their normal orthotopic position although they were somewhat close to the bladder neck as well, they exhibited clear efflux bilaterally. There were no stones or foreign bodies in the bladder. The bladder mucosa was normal without evidence of recurrent bladder cancer a papillary tumor. There was no erythema of the mucosa. The only abnormality was a small area of bullous edema on the left posterior bladder wall with a small bit of granulation tissue in the very center consistent with a small fistula tract noted on the previous interventional radiology imaging of the pelvic drain.  Left retrograde pyelogram - a normal ureter without evidence of extravasation, stricture, dilation or filling defect with a single collecting system also normal in appearance without filling defect dilation.  Right retrograde pyelogram-a normal ureter without evidence of extravasation, stricture dilation or filling defect with a single collecting system. The renal pelvis was dilated consistent with the appearance of an extrarenal pelvis but the  collecting system was nondilated. There were no obvious filling defects.  Description of procedure: After consent was obtained patient brought to the operating room. After adequate anesthesia he was placed in lithotomy position and prepped and draped in the usual sterile fashion. A timeout was performed to confirm the patient and procedure. A rigid cystoscope was passed per urethra and the bladder examined with a 30 and 70 lens. Next a 6 [place] open-ended ureteral catheter was used to cannulate the left ureteral orifice and injecting contrast retrograde. A sensor wire was then advanced and coiled in the collecting system. Over which the 6 French catheter was advanced into the proximal ureter in the region of the UPJ. The wire was removed. The scope was broken and removed leaving the catheter in place.  The cystoscope was repassed adjacent to the left ureteral catheter. Now the right ureteral orifice was cannulated with a second 6 [place] open-ended catheter and retrograde injection of contrast was performed. A sensor wire was then advanced and coiled in the right collecting system. Over the sensor wire the 6 French catheter was advanced and left in the region of the right UPJ. The scope was removed.  An 55 French coud catheter was placed adjacent to both ureteral catheters into the bladder. The balloon was inflated and the catheter was seated at the bladder neck. it was draining clear urine. The ureteral catheters were secured to the Foley catheter with a silk tie. The adapter was then used to connect the ureteral catheters and the Foley to gravity drainage.  I discussed the findings with Dr. Michaell Cowing. He may remove the ureteral catheters at the end of the case if the ureters are not encountered. I would leave the Foley catheter for 2-3 weeks given the  fistula tract and depending on any formal bladder closure. The bladder may not require formal closure as the fistula tract appears small.  Complications:  None  Drains: Bilateral 6 [place] externalized ureteral catheters, an 42 French coud catheter  Blood loss: Minimal  Specimens: None  Disposition: Patient turned over to Dr. Michaell Cowing for his portion of the procedure.

## 2013-01-29 NOTE — Anesthesia Postprocedure Evaluation (Signed)
Anesthesia Post Note  Patient: [name]  Procedure(s) Performed: Procedure(s) (LRB): SPLENIC FLEXOR MOBILIZATION,LAPAROSCOPIC SIGMOID COLECTOMY, LYSIS OF ADHESIONS, OMENTOPEXY, RIGID PROCTOSCOPY (N/A) repair colovesical fustula  (N/A) CYSTOSCOPY WITH BILATERAL RETROGRADE PYELOGRAM/BILATERAL URETERAL STENT PLACEMENT (Bilateral)  Anesthesia type: General  Patient location: PACU  Post pain: Pain level controlled  Post assessment: Post-op Vital signs reviewed  Last Vitals:  Filed Vitals:   01/29/13 1330  BP: 155/68  Pulse: 73  Temp: 36.6 C  Resp: 20    Post vital signs: Reviewed  Level of consciousness: sedated  Complications: No apparent anesthesia complications

## 2013-01-29 NOTE — Transfer of Care (Signed)
Immediate Anesthesia Transfer of Care Note  Patient: SENICA CRALL  Procedure(s) Performed: Procedure(s): SPLENIC FLEXOR MOBILIZATION,LAPAROSCOPIC SIGMOID COLECTOMY, LYSIS OF ADHESIONS, OMENTOPEXY, RIGID PROCTOSCOPY (N/A) repair colovesical fustula  (N/A) CYSTOSCOPY WITH BILATERAL RETROGRADE PYELOGRAM/BILATERAL URETERAL STENT PLACEMENT (Bilateral)  Patient Location: PACU  Anesthesia Type:General  Level of Consciousness: awake, sedated and patient cooperative  Airway & Oxygen Therapy: Patient Spontanous Breathing and Patient connected to nasal cannula oxygen  Post-op Assessment: Report given to PACU RN and Post -op Vital signs reviewed and stable  Post vital signs: stable  Complications: No apparent anesthesia complications

## 2013-01-29 NOTE — Progress Notes (Signed)
Patient continues to not open eyes to pain or speech after narcan administration (per order).  Dr. Maisie Fus notified of patient status.  Ordered to continue to monitor for patient wakefulness and to page back if the patient does not begin to wake up in a few hours.  Will continue to monitor.  Kinnie Feil, RN

## 2013-01-29 NOTE — Progress Notes (Signed)
Patient BP elevated after prn medication administered.  Patient not opening eyes or speaking in response to painful stimuli.  Dr. Michaell Cowing notified and new oders given and initiated.   Will continue to monitor.  Kinnie Feil, RN

## 2013-01-29 NOTE — Interval H&P Note (Signed)
History and Physical Interval Note:  01/29/2013 7:56 AM  [name]  has presented today for surgery, with the diagnosis of diverticulitis with colo vesicle and colocutaneous fistula   The various methods of treatment have been discussed with the patient and family. After consideration of risks, benefits and other options for treatment, the patient has consented to  Procedure(s): LAPAROSCOPIC SIGMOID COLECTOMY  rigid proctoscopy  (N/A) repair colovesical fustula  (N/A) BILATERAL STENT PLACEMENT (N/A) as a surgical intervention .  The patient's history has been reviewed, imaging reviewed, [employer] outpatient chart reviewed, patient examined, no change in status, stable for surgery.  I have reviewed the patient's chart and labs.  Questions were answered to the patient's satisfaction.  Discussed risk of bleeding infection, ureteral injury among others with stent placement. Discussed need for post-op foley given h/o retention (although he has had a TURP) and possible need to allow bladder to heal if fistula tract excised (foley for 2-3 weeks with outpatient cystogram). Will also check bladder for recurrent bladder ca given history.    Antony Haste

## 2013-01-29 NOTE — Progress Notes (Signed)
Pt is here for surgeryPt has a tube from lower abd area connected to leg bag. Pt is eager to get this our

## 2013-01-30 ENCOUNTER — Encounter (HOSPITAL_COMMUNITY): Payer: Self-pay | Admitting: Surgery

## 2013-01-30 LAB — BASIC METABOLIC PANEL
BUN: 13 mg/dL (ref 6–23)
CO2: 24 mEq/L (ref 19–32)
Chloride: 98 mEq/L (ref 96–112)
GFR calc non Af Amer: 80 mL/min — ABNORMAL LOW (ref >90)
Glucose, Bld: 130 mg/dL — ABNORMAL HIGH (ref 70–99)
Potassium: 3.9 mEq/L (ref 3.5–5.1)
Sodium: 134 mEq/L — ABNORMAL LOW (ref 135–145)

## 2013-01-30 LAB — CBC
Hemoglobin: 13 g/dL (ref 13.0–17.0)
MCH: 33.7 pg (ref 26.0–34.0)
MCV: 96.6 fL (ref 78.0–100.0)
RBC: 3.86 MIL/uL — ABNORMAL LOW (ref 4.22–5.81)
WBC: 16.8 10*3/uL — ABNORMAL HIGH (ref 4.0–10.5)

## 2013-01-30 LAB — GLUCOSE, CAPILLARY
Glucose-Capillary: 154 mg/dL — ABNORMAL HIGH (ref 70–99)
Glucose-Capillary: 125 mg/dL — ABNORMAL HIGH (ref 70–99)

## 2013-01-30 LAB — MAGNESIUM: Magnesium: 1.7 mg/dL (ref 1.5–2.5)

## 2013-01-30 MED ORDER — INSULIN ASPART 100 UNIT/ML ~~LOC~~ SOLN: 0.0000 [IU] | Freq: Every day | SUBCUTANEOUS | Status: DC

## 2013-01-30 MED ORDER — NALOXONE HCL 0.4 MG/ML IJ SOLN: 0.4000 mg | INTRAMUSCULAR | Status: DC | PRN

## 2013-01-30 MED ORDER — RESOURCE INSTANT PROTEIN PO PWD PACKET
1.0000 | Freq: Three times a day (TID) | ORAL | Status: DC
  Administered 2013-01-30 – 2013-02-02 (×8): 6 g via ORAL
  Filled 2013-01-30: qty 6
  Filled 2013-01-30: qty 227
  Filled 2013-01-30 (×5): qty 6

## 2013-01-30 MED ORDER — BOOST / RESOURCE BREEZE PO LIQD
1.0000 | Freq: Two times a day (BID) | ORAL | Status: DC
  Administered 2013-01-30 – 2013-02-01 (×5): 1 via ORAL

## 2013-01-30 MED ORDER — INSULIN ASPART 100 UNIT/ML ~~LOC~~ SOLN
0.0000 [IU] | Freq: Three times a day (TID) | SUBCUTANEOUS | Status: DC
  Administered 2013-01-30 (×2): 3 [IU] via SUBCUTANEOUS
  Administered 2013-01-31: 2 [IU] via SUBCUTANEOUS
  Administered 2013-01-31: 5 [IU] via SUBCUTANEOUS
  Administered 2013-02-01: 3 [IU] via SUBCUTANEOUS
  Administered 2013-02-01: 5 [IU] via SUBCUTANEOUS
  Administered 2013-02-01: 2 [IU] via SUBCUTANEOUS

## 2013-01-30 NOTE — Progress Notes (Signed)
1 Day Post-Op Subjective: Patient reports no complaints. Walked with PT this AM. No obvious leak.   Objective: Vital signs in last 24 hours: Temp:  [96 F (35.6 C)-99.5 F (37.5 C)] 99.3 F (37.4 C) (10/17 0800) Pulse Rate:  [64-107] 71 (10/17 0800) Resp:  [14-40] 19 (10/17 0800) BP: (84-238)/(38-110) 159/76 mmHg (10/17 0800) SpO2:  [85 %-100 %] 97 % (10/17 0800) FiO2 (%):  [50 %] 50 % (10/16 1500) Weight:  [67.132 kg (148 lb)-70.4 kg (155 lb 3.3 oz)] 70.4 kg (155 lb 3.3 oz) (10/17 0500)  Intake/Output from previous day: 10/16 0701 - 10/17 0700 In: 3573.3 [I.V.:3423.3; IV Piggyback:150] Out: 2150 [Urine:2125; Blood:25] Intake/Output this shift: Total I/O In: 100 [I.V.:100] Out: -   Physical Exam:  Sitting in chair  Abd - incision C/D/I, non-distended GU - urine gross hematuria with small, soft clots, urine in tube red-tinged, clearing. Stents have been removed.    Lab Results:  Recent Labs  01/30/13 0320  HGB 13.0  HCT 37.3*   BMET  Recent Labs  01/30/13 0320  NA 134*  K 3.9  CL 98  CO2 24  GLUCOSE 130*  BUN 13  CREATININE 0.98  CALCIUM 8.5   No results found for this basename: LABPT, INR,  in the last 72 hours No results found for this basename: LABURIN,  in the last 72 hours Results for orders placed during the hospital encounter of 12/09/12  CULTURE, ROUTINE-ABSCESS     Status: None   Collection Time    12/09/12  2:14 PM      Result Value Range Status   Specimen Description PERITONEAL CAVITY   Final   Special Requests Normal   Final   Gram Stain     Final   Value: ABUNDANT WBC PRESENT, PREDOMINANTLY PMN     NO SQUAMOUS EPITHELIAL CELLS SEEN     MODERATE GRAM NEGATIVE RODS     RARE GRAM POSITIVE RODS     Performed at [facility]   Culture     Final   Value: RARE CANDIDA ALBICANS     Performed at [facility]   Report Status 12/12/2012 FINAL   Final  CULTURE, ROUTINE-ABSCESS     Status: None   Collection Time    12/09/12   2:14 PM      Result Value Range Status   Specimen Description PERITONEAL CAVITY   Final   Special Requests Normal   Final   Gram Stain     Final   Value: ABUNDANT WBC PRESENT,BOTH PMN AND MONONUCLEAR     NO SQUAMOUS EPITHELIAL CELLS SEEN     FEW GRAM NEGATIVE RODS     RARE GRAM POSITIVE RODS     RARE GRAM POSITIVE COCCI   Culture     Final   Value: RARE ESCHERICHIA COLI     Performed at [facility]   Report Status 12/13/2012 FINAL   Final   Organism ID, Bacteria ESCHERICHIA COLI   Final  FUNGUS CULTURE W SMEAR     Status: None   Collection Time    12/09/12  2:18 PM      Result Value Range Status   Specimen Description ABSCESS   Final   Special Requests Normal   Final   Fungal Smear     Final   Value: NO YEAST OR FUNGAL ELEMENTS SEEN     Performed at [facility]   Culture     Final   Value:  YEAST CONSISTENT WITH CANDIDA SPECIES     Note: REFER TO PREVIOUS CULTURE [id number] FOR IDENTIFICATION     Performed at [facility]   Report Status 01/05/2013 FINAL   Final  FUNGUS CULTURE W SMEAR     Status: None   Collection Time    12/09/12  2:19 PM      Result Value Range Status   Specimen Description ABSCESS   Final   Special Requests Normal   Final   Fungal Smear     Final   Value: NO YEAST OR FUNGAL ELEMENTS SEEN     Performed at [facility]   Culture     Final   Value: CANDIDA ALBICANS     Performed at [facility]   Report Status 01/05/2013 FINAL   Final    Studies/Results: Ct Head Wo Contrast  01/29/2013   CLINICAL DATA:  Altered mental status. Unresponsive patient.  EXAM: CT HEAD WITHOUT CONTRAST  TECHNIQUE: Contiguous axial images were obtained from the base of the skull through the vertex without intravenous contrast.  COMPARISON:  No priors.  FINDINGS: Well-defined foci of low attenuation in the inferior aspects of the basal ganglia bilaterally may represent old lacunar infarctions or prominent dilated perivascular spaces. No  acute intracranial abnormalities. Specifically, no evidence of acute intracranial hemorrhage, no definite findings of acute/subacute cerebral ischemia, no mass, mass effect, hydrocephalus or abnormal intra or extra-axial fluid collections. Visualized paranasal sinuses and mastoids are well pneumatized. No acute displaced skull fractures are identified.  IMPRESSION: 1. No acute intracranial abnormalities. 2. Old lacunar infarctions or prominent dilated perivascular spaces in the inferior aspects of the basal ganglia bilaterally.   Electronically Signed   By: Trudie Reed M.D.   On: 01/29/2013 20:24   Dg C-arm 1-60 Min-no Report  01/29/2013   CLINICAL DATA: stent placement   C-ARM 1-60 MINUTES  Fluoroscopy was utilized by the requesting physician.  No radiographic  interpretation.     Assessment/Plan: POD#1 cysto, stents (externalized and removed), SPLENIC FLEXOR MOBILIZATION,LAPAROSCOPIC SIGMOID COLECTOMY, LYSIS OF ADHESIONS, OMENTOPEXY, RIGID PROCTOSCOPY  repair colovesical fustula   External stents have been removed. No obvious bladder leak intra-op and on cystoscopy fistula tract appeared to be very small and granulating. Also pt with no pneumaturia 2 weeks pre-op. Therefore would continue foley for shorter amount of time such as 7-10 days with a cystogram in office prior to d/c.     LOS: 1 day   Antony Haste 01/30/2013, 9:49 AM

## 2013-01-30 NOTE — Evaluation (Signed)
Physical Therapy Evaluation Patient Details Name: [name] MRN: [id number] DOB: 1940/09/01 Today's Date: 01/30/2013 Time: 4098-1191 PT Time Calculation (min): 10 min  PT Assessment / Plan / Recommendation History of Present Illness  Pt is a 72 year old male s/p procedures for diverticulitis with colovesicle and colocutaneous fistula   Clinical Impression  Pt admitted with above. Pt currently with functional limitations due to the deficits listed below (see PT Problem List).  Pt will benefit from skilled PT to increase their independence and safety with mobility to allow discharge to the venue listed below.  Pt doing very well mobilizing and will likely only require 1-2 more visits in hospital.  Recommended pt ambulate with nsg staff during acute stay due to lines/leads.      PT Assessment  Patient needs continued PT services    Follow Up Recommendations  No PT follow up    Does the patient have the potential to tolerate intense rehabilitation      Barriers to Discharge        Equipment Recommendations  None recommended by PT    Recommendations for Other Services     Frequency Min 3X/week    Precautions / Restrictions Precautions Precautions: Fall Precaution Comments: pt reports "wooziness due to pain meds", OnQ pump Restrictions Weight Bearing Restrictions: No   Pertinent Vitals/Pain Pt reports lower abdomen soreness in a band around waist, pt has OnQ pump and premedicated HR and SaO2 WNL     Mobility  Bed Mobility Bed Mobility: Not assessed Transfers Transfers: Sit to Stand;Stand to Sit Sit to Stand: 5: Supervision;With upper extremity assist;From chair/3-in-1 Stand to Sit: 5: Supervision;With upper extremity assist;To chair/3-in-1 Ambulation/Gait Ambulation/Gait Assistance: 4: Min guard Ambulation Distance (Feet): 300 Feet Assistive device: None Ambulation/Gait Assistance Details: min/guard due to "wooziness" from pain meds per pt which did not get  worse Gait Pattern: Within Functional Limits Gait velocity: WFL    Exercises     PT Diagnosis: Difficulty walking  PT Problem List: Decreased mobility;Decreased activity tolerance;Pain PT Treatment Interventions: DME instruction;Gait training;Stair training;Functional mobility training;Therapeutic activities;Therapeutic exercise;Patient/family education     PT Goals(Current goals can be found in the care plan section) Acute Rehab PT Goals PT Goal Formulation: With patient Time For Goal Achievement: 02/06/13 Potential to Achieve Goals: Good  Visit Information  Last PT Received On: 01/30/13 Assistance Needed: +1 History of Present Illness: Pt is a 72 year old male s/p procedures for diverticulitis with colovesicle and colocutaneous fistula        Prior Functioning  Home Living Family/patient expects to be discharged to:: Private residence Living Arrangements: Spouse/significant other Available Help at Discharge: Available PRN/intermittently Type of Home: House Home Access: Stairs to enter Secretary/administrator of Steps: 2 Entrance Stairs-Rails: None Home Layout: One level Home Equipment: Wheelchair - manual Prior Function Level of Independence: Independent Comments: walks 2 miles a day Communication Communication: No difficulties    Cognition  Cognition Arousal/Alertness: Awake/alert Behavior During Therapy: WFL for tasks assessed/performed Overall Cognitive Status: Within Functional Limits for tasks assessed    Extremity/Trunk Assessment Lower Extremity Assessment Lower Extremity Assessment: Overall WFL for tasks assessed Cervical / Trunk Assessment Cervical / Trunk Assessment: Normal   Balance    End of Session PT - End of Session Activity Tolerance: Patient tolerated treatment well Patient left: in chair;with call bell/phone within reach  GP     [facility] 01/30/2013, 9:58 AM Zenovia Jarred, PT, DPT 01/30/2013 Pager: [phone]

## 2013-01-30 NOTE — Progress Notes (Signed)
[name] [id number] 08-Jun-1940  CARE TEAM:  PCP: Gabriel Cirri DO  Outpatient Care Team: Patient Care Team: Gabriel Cirri as PCP - General (Family Medicine) Bjorn Pippin, MD as Consulting Physician (Urology) Vertell Novak., MD as Consulting Physician (Gastroenterology) Ardeth Sportsman, MD as Consulting Physician (General Surgery)  Inpatient Treatment Team: Treatment Team: Attending Provider: Ardeth Sportsman, MD; Registered Nurse: Dorna Bloom, RN; Physical Therapist: Lynnell Catalan, PT   Subjective:  Difficulty with oversedation last night.  Narcotics changed & narcan given. Monitored in Stepdown Unit. Patient sitting up alert.  Denies pain.  Wants to go home  Objective:  Vital signs:  Filed Vitals:   01/30/13 0300 01/30/13 0400 01/30/13 0500 01/30/13 0600  BP: 134/71 148/73 137/72 152/73  Pulse: 74 69 74 73  Temp:  99.5 F (37.5 C)    TempSrc:  Oral    Resp: 27 21 21 26   Height:      Weight:   155 lb 3.3 oz (70.4 kg)   SpO2: 94% 94% 96% 95%       Intake/Output   Yesterday:  10/16 0701 - 10/17 0700 In: 3573.3 [I.V.:3423.3; IV Piggyback:150] Out: 2150 [Urine:2125; Blood:25] This shift:     Bowel function:  Flatus: n  BM: n  Drain: n/a  Physical Exam:  General: Pt awake/alert/oriented x4 in no acute distress Eyes: PERRL, normal EOM.  Sclera clear.  No icterus Neuro: CN II-XII intact w/o focal sensory/motor deficits. Lymph: No head/neck/groin lymphadenopathy Psych:  No delerium/psychosis/paranoia HENT: Normocephalic, Mucus membranes moist.  No thrush Neck: Supple, No tracheal deviation Chest: No chest wall pain w good excursion CV:  Pulses intact.  Regular rhythm MS: Normal AROM mjr joints.  No obvious deformity Abdomen: Soft.  Nondistended.  Mildly tender at incisions only.  No evidence of peritonitis.  No incarcerated hernias. Ext:  SCDs BLE.  No mjr edema.  No cyanosis Skin: No petechiae / purpura   Problem List:    Principal Problem:   Colovesical fistula - diverticular s/p lap colectomy/repair 01/30/13 Active Problems:   Bradycardia   CAD (coronary artery disease)   HTN (hypertension)   Smoking   Colonic diverticular abscess s/p perc drainage x2   Diabetes mellitus   Diverticulitis - recurrent   Colocutaneous fistula - diverticular - s/p lap colectomy/repair 01/30/13   Assessment  [name]  72 y.o. male  1 Day Post-Op  Procedure(s): SPLENIC FLEXOR MOBILIZATION,LAPAROSCOPIC SIGMOID COLECTOMY, LYSIS OF ADHESIONS, OMENTOPEXY, RIGID PROCTOSCOPY repair colovesical fustula  CYSTOSCOPY WITH BILATERAL RETROGRADE PYELOGRAM/BILATERAL URETERAL STENT PLACEMENT  Stabilizing after episode of oversedation.?  Dilaudid ?  Phenergan.  Plan:  Stop Dilaudid - use fentanyl  DC Phenergan.  Start on clears.  Advance diet per colectomy pathway  Keep in step down unit today until he can prove to be doing better.  Hypertension.  On beta blocker.  Better controlled now.  Monitor glucose for possible diabetic control  -VTE prophylaxis- SCDs, etc  -mobilize as tolerated to help recovery  Ardeth Sportsman, M.D., F.A.C.S. Gastrointestinal and Minimally Invasive Surgery [facility] [address], [address] [place], [place] [address] [phone] Main / Paging   01/30/2013   Results:   Labs: Results for orders placed during the hospital encounter of 01/29/13 (from the past 48 hour(s))  TYPE AND SCREEN     Status: None   Collection Time    01/29/13  6:30 AM      Result Value Range   ABO/RH(D)  A POS     Antibody Screen NEG     Sample Expiration 02/01/2013    ABO/RH     Status: None   Collection Time    01/29/13  6:30 AM      Result Value Range   ABO/RH(D) A POS    GLUCOSE, CAPILLARY     Status: Abnormal   Collection Time    01/29/13  6:38 AM      Result Value Range   Glucose-Capillary 100 (*) 70 - 99 mg/dL   Comment 1 Documented in Chart    GLUCOSE,  CAPILLARY     Status: Abnormal   Collection Time    01/29/13 12:47 PM      Result Value Range   Glucose-Capillary 179 (*) 70 - 99 mg/dL  GLUCOSE, CAPILLARY     Status: Abnormal   Collection Time    01/29/13  7:57 PM      Result Value Range   Glucose-Capillary 145 (*) 70 - 99 mg/dL  BLOOD GAS, ARTERIAL     Status: Abnormal   Collection Time    01/29/13  8:25 PM      Result Value Range   FIO2 1.00     Delivery systems NON-REBREATHER OXYGEN MASK     pH, Arterial 7.301 (*) 7.350 - 7.450   pCO2 arterial 45.4 (*) 35.0 - 45.0 mmHg   pO2, Arterial 139.0 (*) 80.0 - 100.0 mmHg   Bicarbonate 21.7  20.0 - 24.0 mEq/L   TCO2 19.7  0 - 100 mmol/L   Acid-base deficit 4.2 (*) 0.0 - 2.0 mmol/L   O2 Saturation 98.5     Patient temperature 98.6     Collection site RIGHT BRACHIAL     Drawn by [id number]     Sample type ARTERIAL DRAW    BASIC METABOLIC PANEL     Status: Abnormal   Collection Time    01/30/13  3:20 AM      Result Value Range   Sodium 134 (*) 135 - 145 mEq/L   Potassium 3.9  3.5 - 5.1 mEq/L   Chloride 98  96 - 112 mEq/L   CO2 24  19 - 32 mEq/L   Glucose, Bld 130 (*) 70 - 99 mg/dL   BUN 13  6 - 23 mg/dL   Creatinine, Ser 0.45  0.50 - 1.35 mg/dL   Calcium 8.5  8.4 - 40.9 mg/dL   GFR calc non Af Amer 80 (*) >90 mL/min   GFR calc Af Amer >90  >90 mL/min   Comment: (NOTE)     The eGFR has been calculated using the CKD EPI equation.     This calculation has not been validated in all clinical situations.     eGFR's persistently <90 mL/min signify possible Chronic Kidney     Disease.  CBC     Status: Abnormal   Collection Time    01/30/13  3:20 AM      Result Value Range   WBC 16.8 (*) 4.0 - 10.5 K/uL   RBC 3.86 (*) 4.22 - 5.81 MIL/uL   Hemoglobin 13.0  13.0 - 17.0 g/dL   HCT 81.1 (*) 91.4 - 78.2 %   MCV 96.6  78.0 - 100.0 fL   MCH 33.7  26.0 - 34.0 pg   MCHC 34.9  30.0 - 36.0 g/dL   RDW 95.6  21.3 - 08.6 %   Platelets 257  150 - 400 K/uL  MAGNESIUM     Status: None  Collection Time    01/30/13  3:20 AM      Result Value Range   Magnesium 1.7  1.5 - 2.5 mg/dL    Imaging / Studies: Ct Head Wo Contrast  01/29/2013   CLINICAL DATA:  Altered mental status. Unresponsive patient.  EXAM: CT HEAD WITHOUT CONTRAST  TECHNIQUE: Contiguous axial images were obtained from the base of the skull through the vertex without intravenous contrast.  COMPARISON:  No priors.  FINDINGS: Well-defined foci of low attenuation in the inferior aspects of the basal ganglia bilaterally may represent old lacunar infarctions or prominent dilated perivascular spaces. No acute intracranial abnormalities. Specifically, no evidence of acute intracranial hemorrhage, no definite findings of acute/subacute cerebral ischemia, no mass, mass effect, hydrocephalus or abnormal intra or extra-axial fluid collections. Visualized paranasal sinuses and mastoids are well pneumatized. No acute displaced skull fractures are identified.  IMPRESSION: 1. No acute intracranial abnormalities. 2. Old lacunar infarctions or prominent dilated perivascular spaces in the inferior aspects of the basal ganglia bilaterally.   Electronically Signed   By: Trudie Reed M.D.   On: 01/29/2013 20:24   Dg C-arm 1-60 Min-no Report  01/29/2013   CLINICAL DATA: stent placement   C-ARM 1-60 MINUTES  Fluoroscopy was utilized by the requesting physician.  No radiographic  interpretation.     Medications / Allergies: per chart  Antibiotics: Anti-infectives   Start     Dose/Rate Route Frequency Ordered Stop   01/29/13 1800  cefoTEtan (CEFOTAN) 2 g in dextrose 5 % 50 mL IVPB     2 g 100 mL/hr over 30 Minutes Intravenous Every 12 hours 01/29/13 1408 01/29/13 1807   01/29/13 0623  cefoTEtan (CEFOTAN) 2 g in dextrose 5 % 50 mL IVPB     2 g 100 mL/hr over 30 Minutes Intravenous On call to O.R. 01/29/13 0623 01/29/13 0800   01/29/13 0600  fluconazole (DIFLUCAN) IVPB 200 mg     200 mg 100 mL/hr over 60 Minutes Intravenous On call  to O.R. 01/28/13 1741 01/29/13 0800   01/29/13 0600  clindamycin (CLEOCIN) 900 mg, gentamicin (GARAMYCIN) 240 mg in sodium chloride 0.9 % 1,000 mL for intraperitoneal lavage      Intraperitoneal To Surgery 01/28/13 1745 01/29/13 0954   01/28/13 1745  clindamycin (CLEOCIN) 900 mg, gentamicin (GARAMYCIN) 240 mg in sodium chloride 0.9 % 1,000 mL for intraperitoneal lavage  Status:  Discontinued    Comments:  Pharmacy may adjust dosing strength, schedule, rate of infusion, etc as needed to optimize therapy   1 application Intraperitoneal To Surgery 01/28/13 1741 01/28/13 1744

## 2013-01-30 NOTE — Progress Notes (Signed)
INITIAL NUTRITION ASSESSMENT  DOCUMENTATION CODES Per approved criteria  -Not Applicable   INTERVENTION: Provide Resource Breeze po BID until diet advanced, each supplement provides 250 kcal and 9 grams of protein. Provide Beneprotein TID with meals until diet advanced Provide Ensure Complete BID when diet is advanced   NUTRITION DIAGNOSIS: Inadequate oral intake related to decreased appetite and frequent hospitalizations as evidenced by 7% wt loss in less than 3 months.   Goal: Pt to meet >/= 90% of their estimated nutrition needs   Monitor:  Diet advancement PO intake Weight Labs  Reason for Assessment: Malnutrition Screening Tool, score of 3  72 y.o. male  Admitting Dx: Colovesical fistula  ASSESSMENT: 72 year old male with history of diabetes, hyperlipidemia, diverticulitis, skin cancer, and bladder cancer presents with diverticulitis with colo vesicle and colocutaneous fistula. Pt is now 1 day s/p lysis of adhesions, splenic fixture mobilization, laparoscopic sigmoid colectomy, and omentopexy of bladder and colovesical fistula. Pt complains of abdominal pain today and poor appetite. He had coffee, broth, and grape juice for breakfast. Pt states he was at [facility] for 1 week and was not allowed to eat- reports 15 lbs wt loss during hospitalization. Pt states he weighed 185 lbs 4 years ago and lost 25 lbs due to being put on a diabetic diet. Pt has been maintaining weight at 160 lbs for the past 2 years until a few months ago he started losing weight and lost down to 147 lbs. Pt reports eating 3 meals daily PTA with Ensure supplements 1-2 times daily. Pt reports walking every day PTA. NFPE shows signs of mild to moderate muscle wasting.   Nutrition Focused Physical Exam:  Subcutaneous Fat:  Orbital Region: wnl Upper Arm Region: mild wasting Thoracic and Lumbar Region: NA  Muscle:  Temple Region: mild wasting Clavicle Bone Region: mild wasting Clavicle and Acromion  Bone Region: mild wasting Scapular Bone Region: NA Dorsal Hand: moderate wasting Patellar Region: moderate wasting Anterior Thigh Region: moderate wasting Posterior Calf Region: mild wasting  Edema: none   Height: Ht Readings from Last 1 Encounters:  01/29/13 6\' 1"  (1.854 m)    Weight: Wt Readings from Last 1 Encounters:  01/30/13 155 lb 3.3 oz (70.4 kg)    Ideal Body Weight: 184 lbs  % Ideal Body Weight: 84%  Wt Readings from Last 10 Encounters:  01/30/13 155 lb 3.3 oz (70.4 kg)  01/30/13 155 lb 3.3 oz (70.4 kg)  01/23/13 148 lb (67.132 kg)  12/24/12 147 lb 9.6 oz (66.951 kg)  12/08/12 149 lb 3.2 oz (67.677 kg)  12/03/12 148 lb (67.132 kg)  12/01/12 151 lb 3.2 oz (68.584 kg)  11/28/12 15 lb (6.804 kg)  11/26/12 151 lb 6.4 oz (68.675 kg)  11/10/12 159 lb (72.122 kg)  Per nursing notes, weight on 01/29/13 was 148 lbs  Usual Body Weight: 160 lbs  % Usual Body Weight: 93% (based on wt 10/16)  BMI:  Body mass index is 20.48 kg/(m^2).  Estimated Nutritional Needs: Kcal: 1800-2000 Protein: 85-95 grams Fluid: 1.8- 2 L/day  Skin: perineum and abdominal incision, abdominal port  Diet Order: Clear Liquid  EDUCATION NEEDS: -No education needs identified at this time   Intake/Output Summary (Last 24 hours) at 01/30/13 1032 Last data filed at 01/30/13 0800  Gross per 24 hour  Intake 1673.33 ml  Output   1700 ml  Net -26.67 ml    Last BM: PTA  Labs:   Recent Labs Lab 01/30/13 0320  NA 134*  K 3.9  CL 98  CO2 24  BUN 13  CREATININE 0.98  CALCIUM 8.5  MG 1.7  GLUCOSE 130*    CBG (last 3)   Recent Labs  01/29/13 0638 01/29/13 1247 01/29/13 1957  GLUCAP 100* 179* 145*    Scheduled Meds: . acetaminophen  1,000 mg Oral TID  . alvimopan  12 mg Oral BID  . aspirin  81 mg Oral Daily  . heparin subcutaneous  5,000 Units Subcutaneous Q8H  . insulin aspart  0-15 Units Subcutaneous TID WC  . insulin aspart  0-5 Units Subcutaneous QHS  .  metoprolol  25 mg Oral BID  . multivitamin with minerals  1 tablet Oral Daily  . saccharomyces boulardii  250 mg Oral BID  . simvastatin  40 mg Oral QPM    Continuous Infusions: . sodium chloride 50 mL/hr at 01/29/13 1532  . bupivacaine 0.25 % ON-Q pump DUAL CATH 300 mL      Past Medical History  Diagnosis Date  . Diabetes mellitus   . Hyperlipidemia     Is on statin therapy  . Diverticulitis [id number]    "all my life"  . Normal nuclear stress test 2010    EF 63% with no ischemia  . Skin cancer   . Bladder cancer   . Hypertension     LOV  Dr Shirlee Latch with clearance 05/08/11 and EKG  in EPIC   . CAD (coronary artery disease)     Mild per cath in 2002/last nuclear stress per note  2010  . Colonoscopy refused     Sister died from delayed Dx of post-colonoscopy   . Subclavian steal syndrome     left; evaluated by [name]; managed medically , no accurate B/p left arm    Past Surgical History  Procedure Laterality Date  . Cardiac catheterization  2002    He had a which showed mild coronary atherosclerosis and normal left ventricular function.  . Rotator cuff repair      right  . Tonsillectomy    . Appendectomy    . Cystoscopy with injection      cystoscopy with BCG injections  . Transurethral resection of prostate  05/17/2011    Procedure: TRANSURETHRAL RESECTION OF THE PROSTATE WITH GYRUS INSTRUMENTS;  Surgeon: Anner Crete, MD;  Location: [facility];  Service: Urology;  Laterality: N/A;  . Peritoneal wound drainage  01-23-13    implant of abdomianl drain  . Cataract extraction, bilateral Bilateral 01-23-13    11'13    Ian Malkin RD, LDN Inpatient Clinical Dietitian Pager: [phone] After Hours Pager: [phone]

## 2013-01-31 LAB — HEMOGLOBIN A1C: Mean Plasma Glucose: 131 mg/dL — ABNORMAL HIGH (ref <117)

## 2013-01-31 LAB — GLUCOSE, CAPILLARY
Glucose-Capillary: 138 mg/dL — ABNORMAL HIGH (ref 70–99)
Glucose-Capillary: 132 mg/dL — ABNORMAL HIGH (ref 70–99)
Glucose-Capillary: 118 mg/dL — ABNORMAL HIGH (ref 70–99)
Glucose-Capillary: 227 mg/dL — ABNORMAL HIGH (ref 70–99)
Glucose-Capillary: 85 mg/dL (ref 70–99)

## 2013-01-31 MED ORDER — INFLUENZA VAC SPLIT QUAD 0.5 ML IM SUSP
0.5000 mL | INTRAMUSCULAR | Status: AC
  Administered 2013-02-01: 0.5 mL via INTRAMUSCULAR
  Filled 2013-01-31 (×2): qty 0.5

## 2013-01-31 MED ORDER — PNEUMOCOCCAL VAC POLYVALENT 25 MCG/0.5ML IJ INJ
0.5000 mL | INJECTION | INTRAMUSCULAR | Status: AC
  Filled 2013-01-31 (×2): qty 0.5

## 2013-01-31 NOTE — Progress Notes (Signed)
Patient ID: [name], male   DOB: 1940-11-21, 73 y.o.   MRN: [id number]  2 Days Post-Op Subjective: Pt with no specific complaints. Denies urine per rectum.  Objective: Vital signs in last 24 hours: Temp:  [98.1 F (36.7 C)-100.1 F (37.8 C)] 98.8 F (37.1 C) (10/18 0800) Pulse Rate:  [60-84] 71 (10/18 0700) Resp:  [17-32] 27 (10/18 0700) BP: (139-187)/(58-100) 170/85 mmHg (10/18 0700) SpO2:  [94 %-98 %] 95 % (10/18 0700) Weight:  [66.6 kg (146 lb 13.2 oz)] 66.6 kg (146 lb 13.2 oz) (10/18 0408)  Intake/Output from previous day: 10/17 0701 - 10/18 0700 In: 1250 [I.V.:1250] Out: 3025 [Urine:3025] Intake/Output this shift:    Physical Exam:  General: Alert and oriented Urine: Clear, draining well with grossly clear urine  Lab Results:  Recent Labs  01/30/13 0320  HGB 13.0  HCT 37.3*   BMET  Recent Labs  01/30/13 0320  NA 134*  K 3.9  CL 98  CO2 24  GLUCOSE 130*  BUN 13  CREATININE 0.98  CALCIUM 8.5      Assessment/Plan: Continue urethral catheter during hospitalization.  Will plan outpatient follow up for consideration catheter removal in 7-10 days.  Will leave it up to Dr. Mena Dawson whether a cystogram would be indicated prior to catheter removal.   LOS: 2 days   [name],[name] 01/31/2013, 8:14 AM

## 2013-01-31 NOTE — Progress Notes (Signed)
Patient ID: [name], male   DOB: 20-Apr-1940, 72 y.o.   MRN: [id number]  General Surgery - [facility] - Progress Note  POD# 2  Subjective: Patient up in chair.  Tolerating clear liquid diet.  Less sedated.  "sore".  Objective: Vital signs in last 24 hours: Temp:  [98.1 F (36.7 C)-100.1 F (37.8 C)] 98.8 F (37.1 C) (10/18 0800) Pulse Rate:  [60-84] 74 (10/18 0800) Resp:  [17-32] 23 (10/18 0800) BP: (139-187)/(58-100) 163/87 mmHg (10/18 0800) SpO2:  [94 %-98 %] 97 % (10/18 0800) Weight:  [146 lb 13.2 oz (66.6 kg)] 146 lb 13.2 oz (66.6 kg) (10/18 0408)    Intake/Output from previous day: 10/17 0701 - 10/18 0700 In: 1250 [I.V.:1250] Out: 3025 [Urine:3025]  Exam: HEENT - clear, not icteric Neck - soft Chest - clear bilaterally Cor - RRR, no murmur Abd - soft without distension; wound clear and dry; On-Q in place; rare BS present Ext - no significant edema Neuro - grossly intact, no focal deficits  Lab Results:   Recent Labs  01/30/13 0320  WBC 16.8*  HGB 13.0  HCT 37.3*  PLT 257     Recent Labs  01/30/13 0320  NA 134*  K 3.9  CL 98  CO2 24  GLUCOSE 130*  BUN 13  CREATININE 0.98  CALCIUM 8.5    Studies/Results: Ct Head Wo Contrast  01/29/2013   CLINICAL DATA:  Altered mental status. Unresponsive patient.  EXAM: CT HEAD WITHOUT CONTRAST  TECHNIQUE: Contiguous axial images were obtained from the base of the skull through the vertex without intravenous contrast.  COMPARISON:  No priors.  FINDINGS: Well-defined foci of low attenuation in the inferior aspects of the basal ganglia bilaterally may represent old lacunar infarctions or prominent dilated perivascular spaces. No acute intracranial abnormalities. Specifically, no evidence of acute intracranial hemorrhage, no definite findings of acute/subacute cerebral ischemia, no mass, mass effect, hydrocephalus or abnormal intra or extra-axial fluid collections. Visualized paranasal sinuses  and mastoids are well pneumatized. No acute displaced skull fractures are identified.  IMPRESSION: 1. No acute intracranial abnormalities. 2. Old lacunar infarctions or prominent dilated perivascular spaces in the inferior aspects of the basal ganglia bilaterally.   Electronically Signed   By: Trudie Reed M.D.   On: 01/29/2013 20:24   Dg C-arm 1-60 Min-no Report  01/29/2013   CLINICAL DATA: stent placement   C-ARM 1-60 MINUTES  Fluoroscopy was utilized by the requesting physician.  No radiographic  interpretation.     Assessment / Plan: 1.  Colovesical fistula - diverticular s/p lap colectomy/repair 01/30/13  Continue CL diet  Ambulate in halls  Transfer to floor today  Velora Heckler, MD, [facility] Office: [phone]  01/31/2013

## 2013-02-01 LAB — GLUCOSE, CAPILLARY
Glucose-Capillary: 160 mg/dL — ABNORMAL HIGH (ref 70–99)
Glucose-Capillary: 207 mg/dL — ABNORMAL HIGH (ref 70–99)
Glucose-Capillary: 96 mg/dL (ref 70–99)

## 2013-02-01 LAB — CBC
HCT: 36.7 % — ABNORMAL LOW (ref 39.0–52.0)
Hemoglobin: 12.7 g/dL — ABNORMAL LOW (ref 13.0–17.0)
MCHC: 34.6 g/dL (ref 30.0–36.0)
Platelets: 244 10*3/uL (ref 150–400)
RDW: 14.4 % (ref 11.5–15.5)
WBC: 10.2 10*3/uL (ref 4.0–10.5)

## 2013-02-01 NOTE — Progress Notes (Signed)
Patient ID: [name], male   DOB: 02/06/1941, 72 y.o.   MRN: [id number] [facility] Progress Note:   3 Days Post-Op  Subjective: Mental status is clear.  Was transferred yesterday to 5W.   Objective: Vital signs in last 24 hours: Temp:  [97.5 F (36.4 C)-98.9 F (37.2 C)] 97.5 F (36.4 C) (10/19 0609) Pulse Rate:  [62-81] 70 (10/19 0935) Resp:  [14-20] 20 (10/19 0609) BP: (132-170)/(79-94) 156/88 mmHg (10/19 0935) SpO2:  [94 %-99 %] 96 % (10/19 0609) Weight:  [147 lb 4.8 oz (66.815 kg)] 147 lb 4.8 oz (66.815 kg) (10/19 0609)  Intake/Output from previous day: 10/18 0701 - 10/19 0700 In: 1750 [P.O.:600; I.V.:1150] Out: 2700 [Urine:2700] Intake/Output this shift: Total I/O In: 240 [P.O.:240] Out: -   Physical Exam: Work of breathing is normal.  Has been having some liquid BMs.  Lower abdominal pain radiatiing into the back.  Wants more to eat.   Lab Results:  Results for orders placed during the hospital encounter of 01/29/13 (from the past 48 hour(s))  GLUCOSE, CAPILLARY     Status: Abnormal   Collection Time    01/30/13 11:37 AM      Result Value Range   Glucose-Capillary 154 (*) 70 - 99 mg/dL   Comment 1 Documented in Chart     Comment 2 Notify RN    GLUCOSE, CAPILLARY     Status: Abnormal   Collection Time    01/30/13  4:09 PM      Result Value Range   Glucose-Capillary 166 (*) 70 - 99 mg/dL  GLUCOSE, CAPILLARY     Status: Abnormal   Collection Time    01/30/13  9:31 PM      Result Value Range   Glucose-Capillary 125 (*) 70 - 99 mg/dL  HEMOGLOBIN W0J     Status: Abnormal   Collection Time    01/31/13  3:40 AM      Result Value Range   Hemoglobin A1C 6.2 (*) <5.7 %   Comment: (NOTE)                                                                               According to the ADA Clinical Practice Recommendations for 2011, when     HbA1c is used as a screening test:      >=6.5%   Diagnostic of Diabetes Mellitus               (if abnormal  result is confirmed)     5.7-6.4%   Increased risk of developing Diabetes Mellitus     References:Diagnosis and Classification of Diabetes Mellitus,Diabetes     Care,2011,34(Suppl 1):S62-S69 and Standards of Medical Care in             Diabetes - 2011,Diabetes Care,2011,34 (Suppl 1):S11-S61.   Mean Plasma Glucose 131 (*) <117 mg/dL   Comment: Performed at [facility]  GLUCOSE, CAPILLARY     Status: Abnormal   Collection Time    01/31/13  3:41 AM      Result Value Range   Glucose-Capillary 138 (*) 70 - 99 mg/dL  GLUCOSE, CAPILLARY     Status: Abnormal   Collection Time  01/31/13  7:26 AM      Result Value Range   Glucose-Capillary 132 (*) 70 - 99 mg/dL   Comment 1 Documented in Chart     Comment 2 Notify RN    GLUCOSE, CAPILLARY     Status: Abnormal   Collection Time    01/31/13 12:02 PM      Result Value Range   Glucose-Capillary 118 (*) 70 - 99 mg/dL  GLUCOSE, CAPILLARY     Status: Abnormal   Collection Time    01/31/13  4:18 PM      Result Value Range   Glucose-Capillary 227 (*) 70 - 99 mg/dL  GLUCOSE, CAPILLARY     Status: None   Collection Time    01/31/13 10:05 PM      Result Value Range   Glucose-Capillary 85  70 - 99 mg/dL  CBC     Status: Abnormal   Collection Time    02/01/13  4:05 AM      Result Value Range   WBC 10.2  4.0 - 10.5 K/uL   RBC 3.81 (*) 4.22 - 5.81 MIL/uL   Hemoglobin 12.7 (*) 13.0 - 17.0 g/dL   HCT 16.1 (*) 09.6 - 04.5 %   MCV 96.3  78.0 - 100.0 fL   MCH 33.3  26.0 - 34.0 pg   MCHC 34.6  30.0 - 36.0 g/dL   RDW 40.9  81.1 - 91.4 %   Platelets 244  150 - 400 K/uL  GLUCOSE, CAPILLARY     Status: Abnormal   Collection Time    02/01/13  7:35 AM      Result Value Range   Glucose-Capillary 160 (*) 70 - 99 mg/dL    Radiology/Results: No results found.  Anti-infectives: Anti-infectives   Start     Dose/Rate Route Frequency Ordered Stop   01/29/13 1800  cefoTEtan (CEFOTAN) 2 g in dextrose 5 % 50 mL IVPB     2 g 100 mL/hr over 30  Minutes Intravenous Every 12 hours 01/29/13 1408 01/29/13 1807   01/29/13 0623  cefoTEtan (CEFOTAN) 2 g in dextrose 5 % 50 mL IVPB     2 g 100 mL/hr over 30 Minutes Intravenous On call to O.R. 01/29/13 0623 01/29/13 0800   01/29/13 0600  fluconazole (DIFLUCAN) IVPB 200 mg     200 mg 100 mL/hr over 60 Minutes Intravenous On call to O.R. 01/28/13 1741 01/29/13 0800   01/29/13 0600  clindamycin (CLEOCIN) 900 mg, gentamicin (GARAMYCIN) 240 mg in sodium chloride 0.9 % 1,000 mL for intraperitoneal lavage      Intraperitoneal To Surgery 01/28/13 1745 01/29/13 0954   01/28/13 1745  clindamycin (CLEOCIN) 900 mg, gentamicin (GARAMYCIN) 240 mg in sodium chloride 0.9 % 1,000 mL for intraperitoneal lavage  Status:  Discontinued    Comments:  Pharmacy may adjust dosing strength, schedule, rate of infusion, etc as needed to optimize therapy   1 application Intraperitoneal To Surgery 01/28/13 1741 01/28/13 1744      Assessment/Plan: Problem List: Patient Active Problem List   Diagnosis Date Noted  . Colovesical fistula - diverticular s/p lap colectomy/repair 01/30/13 12/24/2012  . Colocutaneous fistula - diverticular - s/p lap colectomy/repair 01/30/13 12/24/2012  . Diverticulitis - recurrent 12/08/2012  . Abscess of abdominal wall 11/28/2012  . Colonic diverticular abscess s/p perc drainage x2 11/10/2012  . Diabetes mellitus 11/10/2012  . Smoking 11/25/2011  . Pre-operative clearance 05/08/2011  . Bradycardia 11/27/2010  . CAD (coronary artery disease) 11/27/2010  . Hyperlipidemia  11/27/2010  . HTN (hypertension) 11/27/2010  . Subclavian steal syndrome 11/27/2010    Advance diet.  Continues observation.  Discontinue onQ pump (empty).   3 Days Post-Op    LOS: 3 days   Matt B. Daphine Deutscher, MD, [facility] [phone] beeper [phone]  02/01/2013 9:50 AM

## 2013-02-02 LAB — GLUCOSE, CAPILLARY
Glucose-Capillary: 89 mg/dL (ref 70–99)
Glucose-Capillary: 104 mg/dL — ABNORMAL HIGH (ref 70–99)
Glucose-Capillary: 107 mg/dL — ABNORMAL HIGH (ref 70–99)

## 2013-02-02 MED ORDER — OXYCODONE HCL 5 MG PO TABS: 5.0000 mg | ORAL_TABLET | ORAL | Status: DC | PRN

## 2013-02-02 MED ORDER — SODIUM CHLORIDE 0.9 % IJ SOLN: 3.0000 mL | INTRAMUSCULAR | Status: DC | PRN

## 2013-02-02 MED ORDER — GLIPIZIDE 2.5 MG HALF TABLET
2.5000 mg | ORAL_TABLET | Freq: Every day | ORAL | Status: DC
  Administered 2013-02-02 – 2013-02-03 (×2): 2.5 mg via ORAL
  Filled 2013-02-02 (×3): qty 1

## 2013-02-02 MED ORDER — FENTANYL CITRATE 0.05 MG/ML IJ SOLN: 25.0000 ug | INTRAMUSCULAR | Status: DC | PRN

## 2013-02-02 MED ORDER — SODIUM CHLORIDE 0.9 % IJ SOLN
3.0000 mL | Freq: Two times a day (BID) | INTRAMUSCULAR | Status: DC
  Administered 2013-02-02 (×2): 3 mL via INTRAVENOUS

## 2013-02-02 MED ORDER — OXYCODONE HCL 5 MG PO TABS
5.0000 mg | ORAL_TABLET | Freq: Four times a day (QID) | ORAL | Status: DC | PRN
  Administered 2013-02-02: 5 mg via ORAL
  Administered 2013-02-02: 10 mg via ORAL
  Administered 2013-02-02 (×2): 5 mg via ORAL
  Administered 2013-02-03: 10 mg via ORAL
  Filled 2013-02-02: qty 2
  Filled 2013-02-02: qty 1
  Filled 2013-02-02: qty 2
  Filled 2013-02-02 (×2): qty 1

## 2013-02-02 MED ORDER — LACTATED RINGERS IV BOLUS (SEPSIS): 1000.0000 mL | Freq: Three times a day (TID) | INTRAVENOUS | Status: DC | PRN

## 2013-02-02 NOTE — Progress Notes (Signed)
[name] [id number] 1940-11-04  CARE TEAM:  PCP: Gabriel Cirri DO  Outpatient Care Team: Patient Care Team: Gabriel Cirri as PCP - General (Family Medicine) Bjorn Pippin, MD as Consulting Physician (Urology) Vertell Novak., MD as Consulting Physician (Gastroenterology) Ardeth Sportsman, MD as Consulting Physician (General Surgery)  Inpatient Treatment Team: Treatment Team: Attending Provider: Ardeth Sportsman, MD; Consulting Physician: Antony Haste, MD; Technician: Melvenia Beam, NT; Technician: Vella Raring, NT; Physical Therapist: Gershon Mussel, PT   Subjective:  Patient on floor.  Tolerating full liquids periods one solids.  Denies much pain.  Some discomfort with bowel movements.  A little old blood with bowel movements.  Mildly concerning.  Overall feels like he is doing well.   Objective:  Vital signs:  Filed Vitals:   02/01/13 0935 02/01/13 2206 02/01/13 2208 02/02/13 0444  BP: 156/88 137/78 137/78 174/96  Pulse: 70 77 77 74  Temp:  98 F (36.7 C)  98.2 F (36.8 C)  TempSrc:  Oral  Oral  Resp:  18  20  Height:      Weight:    151 lb 8 oz (68.72 kg)  SpO2:  94%  95%       Intake/Output   Yesterday:  10/19 0701 - 10/20 0700 In: 1920 [P.O.:720; I.V.:1200] Out: 2500 [Urine:2500] This shift:     Bowel function:  Flatus: Yes  BM: Yes  Drain: n/a  Physical Exam:  General: Pt awake/alert/oriented x4 in no acute distress Eyes: PERRL, normal EOM.  Sclera clear.  No icterus Neuro: CN II-XII intact w/o focal sensory/motor deficits. Lymph: No head/neck/groin lymphadenopathy Psych:  No delerium/psychosis/paranoia HENT: Normocephalic, Mucus membranes moist.  No thrush Neck: Supple, No tracheal deviation Chest: No chest wall pain w good excursion CV:  Pulses intact.  Regular rhythm MS: Normal AROM mjr joints.  No obvious deformity Abdomen: Soft.  Nondistended.  Mildly tender at incisions only.  No cellulitis.  No  evidence of peritonitis.  No incarcerated hernias. Ext:  SCDs BLE.  No mjr edema.  No cyanosis Skin: No petechiae / purpura   Problem List:   Principal Problem:   Colovesical fistula - diverticular s/p lap colectomy/repair 01/30/13 Active Problems:   Bradycardia   CAD (coronary artery disease)   HTN (hypertension)   Smoking   Colonic diverticular abscess s/p perc drainage x2   Diabetes mellitus   Diverticulitis - recurrent   Colocutaneous fistula - diverticular - s/p lap colectomy/repair 01/30/13   Assessment  [name]  72 y.o. male  4 Days Post-Op  Procedure(s): SPLENIC FLEXOR MOBILIZATION,LAPAROSCOPIC SIGMOID COLECTOMY, LYSIS OF ADHESIONS, OMENTOPEXY, RIGID PROCTOSCOPY repair colovesical fustula  CYSTOSCOPY WITH BILATERAL RETROGRADE PYELOGRAM/BILATERAL URETERAL STENT PLACEMENT  Recovering well so far.  Plan:  Adv diet to solids per colectomy pathway  Hypertension.  On beta blocker.  Better controlled now.  Monitor glucose for diabetic control.  Restart glipizide  -VTE prophylaxis- SCDs, etc  -mobilize as tolerated to help recovery  D/C patient from hospital when patient meets criteria (anticipate in 1-2 day(s)):  Tolerating oral intake well Ambulating in walkways Adequate pain control without IV medications Urinating  Having flatus  Ardeth Sportsman, M.D., F.A.C.S. Gastrointestinal and Minimally Invasive Surgery [facility] [address] [place], [place] [address] [phone] Main / Paging   02/02/2013   Results:   Labs: Results for orders placed during the hospital encounter of 01/29/13 (from the past 48 hour(s))  GLUCOSE, CAPILLARY  Status: Abnormal   Collection Time    01/31/13 12:02 PM      Result Value Range   Glucose-Capillary 118 (*) 70 - 99 mg/dL  GLUCOSE, CAPILLARY     Status: Abnormal   Collection Time    01/31/13  4:18 PM      Result Value Range   Glucose-Capillary 227 (*) 70 - 99 mg/dL   GLUCOSE, CAPILLARY     Status: None   Collection Time    01/31/13 10:05 PM      Result Value Range   Glucose-Capillary 85  70 - 99 mg/dL  CBC     Status: Abnormal   Collection Time    02/01/13  4:05 AM      Result Value Range   WBC 10.2  4.0 - 10.5 K/uL   RBC 3.81 (*) 4.22 - 5.81 MIL/uL   Hemoglobin 12.7 (*) 13.0 - 17.0 g/dL   HCT 16.1 (*) 09.6 - 04.5 %   MCV 96.3  78.0 - 100.0 fL   MCH 33.3  26.0 - 34.0 pg   MCHC 34.6  30.0 - 36.0 g/dL   RDW 40.9  81.1 - 91.4 %   Platelets 244  150 - 400 K/uL  GLUCOSE, CAPILLARY     Status: Abnormal   Collection Time    02/01/13  7:35 AM      Result Value Range   Glucose-Capillary 160 (*) 70 - 99 mg/dL  GLUCOSE, CAPILLARY     Status: Abnormal   Collection Time    02/01/13 12:07 PM      Result Value Range   Glucose-Capillary 207 (*) 70 - 99 mg/dL  GLUCOSE, CAPILLARY     Status: Abnormal   Collection Time    02/01/13  5:36 PM      Result Value Range   Glucose-Capillary 129 (*) 70 - 99 mg/dL  GLUCOSE, CAPILLARY     Status: None   Collection Time    02/01/13 10:04 PM      Result Value Range   Glucose-Capillary 96  70 - 99 mg/dL    Imaging / Studies: No results found.  Medications / Allergies: per chart  Antibiotics: Anti-infectives   Start     Dose/Rate Route Frequency Ordered Stop   01/29/13 1800  cefoTEtan (CEFOTAN) 2 g in dextrose 5 % 50 mL IVPB     2 g 100 mL/hr over 30 Minutes Intravenous Every 12 hours 01/29/13 1408 01/29/13 1807   01/29/13 0623  cefoTEtan (CEFOTAN) 2 g in dextrose 5 % 50 mL IVPB     2 g 100 mL/hr over 30 Minutes Intravenous On call to O.R. 01/29/13 0623 01/29/13 0800   01/29/13 0600  fluconazole (DIFLUCAN) IVPB 200 mg     200 mg 100 mL/hr over 60 Minutes Intravenous On call to O.R. 01/28/13 1741 01/29/13 0800   01/29/13 0600  clindamycin (CLEOCIN) 900 mg, gentamicin (GARAMYCIN) 240 mg in sodium chloride 0.9 % 1,000 mL for intraperitoneal lavage      Intraperitoneal To Surgery 01/28/13 1745 01/29/13 0954    01/28/13 1745  clindamycin (CLEOCIN) 900 mg, gentamicin (GARAMYCIN) 240 mg in sodium chloride 0.9 % 1,000 mL for intraperitoneal lavage  Status:  Discontinued    Comments:  Pharmacy may adjust dosing strength, schedule, rate of infusion, etc as needed to optimize therapy   1 application Intraperitoneal To Surgery 01/28/13 1741 01/28/13 1744

## 2013-02-02 NOTE — Progress Notes (Signed)
Physical Therapy Treatment Patient Details Name: [name] MRN: [id number] DOB: 07-Oct-1940 Today's Date: 02/02/2013 Time: 4098-1191 PT Time Calculation (min): 11 min  PT Assessment / Plan / Recommendation  History of Present Illness Pt is a 72 year old male s/p procedures for diverticulitis with colovesicle and colocutaneous fistula    PT Comments   Progressing very well with mobility. Pt is Mod I with all mobility. Will d/c from PT.   Follow Up Recommendations  No PT follow up     Does the patient have the potential to tolerate intense rehabilitation     Barriers to Discharge        Equipment Recommendations  None recommended by PT    Recommendations for Other Services    Frequency     Progress towards PT Goals Progress towards PT goals: Goals met/education completed, patient discharged from PT  Plan Current plan remains appropriate    Precautions / Restrictions Precautions Precautions: None Restrictions Weight Bearing Restrictions: No   Pertinent Vitals/Pain Abdomen-"sore"    Mobility  Bed Mobility Bed Mobility: Sit to Supine Sit to Supine: 6: Modified independent (Device/Increase time) Transfers Transfers: Sit to Stand;Stand to Sit Stand to Sit: 6: Modified independent (Device/Increase time) Ambulation/Gait Ambulation/Gait Assistance: 6: Modified independent (Device/Increase time) Ambulation Distance (Feet): 500 Feet Ambulation/Gait Assistance Details: pt had already made 4 laps around Gait Pattern: Within Functional Limits Stairs: Yes Stairs Assistance: 6: Modified independent (Device/Increase time) Stair Management Technique: Forwards;Alternating pattern Number of Stairs: 9    Exercises     PT Diagnosis:    PT Problem List:   PT Treatment Interventions:     PT Goals (current goals can now be found in the care plan section)    Visit Information  Last PT Received On: 02/02/13 Assistance Needed: +1 History of Present Illness: Pt is a 72 year  old male s/p procedures for diverticulitis with colovesicle and colocutaneous fistula     Subjective Data      Cognition  Cognition Arousal/Alertness: Awake/alert Behavior During Therapy: WFL for tasks assessed/performed Overall Cognitive Status: Within Functional Limits for tasks assessed    Balance     End of Session PT - End of Session Activity Tolerance: Patient tolerated treatment well Patient left: in bed;with call bell/phone within reach   GP     Rebeca Alert, MPT Pager: [phone]

## 2013-02-02 NOTE — Progress Notes (Signed)
Pharmacy Brief Note - Alvimopan (Entereg)  The standing order set for alvimopan (Entereg) now includes an automatic order to discontinue the drug after the patient has had a bowel movement.  The change was approved by the Pharmacy & Therapeutics Committee and the Medical Executive Committee.    This patient has had bowel movements documented by nursing.  Therefore, alvimopan has been discontinued.  If there are questions, please contact the pharmacy at [phone].  Thank you- Elie Goody, PharmD, BCPS Pager: [phone] 02/02/2013  12:37 PM

## 2013-02-02 NOTE — Progress Notes (Signed)
Physical Therapy Discharge Patient Details Name: [name] MRN: [id number] DOB: [birth year] Today's Date: 02/02/2013 Time: 7829-5621 PT Time Calculation (min): 11 min  Patient discharged from PT services secondary to goals met and no further PT needs identified.  Please see latest therapy progress note for current level of functioning and progress toward goals.    Progress and discharge plan discussed with patient and/or caregiver: Patient/Caregiver agrees with plan  GP     Rebeca Alert, MPT Pager: [phone]

## 2013-02-03 LAB — GLUCOSE, CAPILLARY: Glucose-Capillary: 107 mg/dL — ABNORMAL HIGH (ref 70–99)

## 2013-02-03 NOTE — Care Management Note (Signed)
    Page 1 of 2   02/03/2013     6:00:16 PM   CARE MANAGEMENT NOTE 02/03/2013  Patient:  [name], [name]   Account Number:  [id number]  Date Initiated:  01/29/2013  Documentation initiated by:  DAVIS,RHONDA  Subjective/Objective Assessment:   diverticulitis with colovesicle and colocutaneous fistula  LYSIS OF ADHESIONS  SPLENIC FLEXURE MOBILIZATION  LAPAROSCOPIC SIGMOID COLECTOMY  OMENTOPEXY of bladder at colovesical fistula  RIGID PROCTOSCOPY     Action/Plan:   home may neede hhc will follow   Anticipated DC Date:  02/01/2013   Anticipated DC Plan:  HOME W HOME HEALTH SERVICES  In-house referral  NA      DC Planning Services  NA      [facility] Choice  NA   Choice offered to / List presented to:  NA   DME arranged  NA      DME agency  NA     HH arranged  NA      HH agency  NA   Status of service:  Completed, signed off Medicare Important Message given?  NA - LOS <3 / Initial given by admissions (If response is "NO", the following Medicare IM given date fields will be blank) Date Medicare IM given:   Date Additional Medicare IM given:    Discharge Disposition:  HOME/SELF CARE  Per UR Regulation:  Reviewed for med. necessity/level of care/duration of stay  If discussed at Long Length of Stay Meetings, dates discussed:    Comments:  02/03/2013 Colleen Can BSN RN CCM [phone] CM spoke with patient.  Plans are for him to return to his home in [facility] where spouse will be caregiver. No dme or HH needs or orders.  Rhonda Davis,RN,BSN,CCM chart reviewed patient is operative day for mutiple complicated surgeries and post op care.

## 2013-02-03 NOTE — Discharge Summary (Addendum)
Physician Discharge Summary  Patient ID: [name] MRN: [id number] DOB/AGE: 06-13-1940 72 y.o.  Admit date: 01/29/2013 Discharge date: 02/03/2013  Patient Care Team: Gabriel Cirri as PCP - General (Family Medicine) Vertell Novak., MD as Consulting Physician (Gastroenterology) Ardeth Sportsman, MD as Consulting Physician (General Surgery) Antony Haste, MD as Consulting Physician (Urology)   Admission Diagnoses: Principal Problem:   Colovesical fistula - diverticular s/p lap colectomy/repair 01/30/13 Active Problems:   Bradycardia   CAD (coronary artery disease)   HTN (hypertension)   Smoking   Colonic diverticular abscess s/p perc drainage x2   Diabetes mellitus   Diverticulitis - recurrent   Colocutaneous fistula - diverticular - s/p lap colectomy/repair 01/30/13  Discharge Diagnoses:  Principal Problem:   Colovesical fistula - diverticular s/p lap colectomy/repair 01/30/13 Active Problems:   Bradycardia   CAD (coronary artery disease)   HTN (hypertension)   Smoking   Colonic diverticular abscess s/p perc drainage x2   Diabetes mellitus   Diverticulitis - recurrent   Colocutaneous fistula - diverticular - s/p lap colectomy/repair 01/30/13   Discharged Condition: good  Hospital Course:   Pt underwent surgery. Postoperatively, the patient mobilized in the hallways and advanced to a solid diet gradually.  Pain was well-controlled and transitioned off IV medications.    By the time of discharge, the patient was walking well the hallways, eating food well, having flatus.  Pain was-controlled on an oral regimen.  Based on meeting DC criteria and recovering well, I felt it was safe for the patient to be discharged home with close followup.  Instructions were discussed in detail.  They are written as well.  Pathology c/w diverticulitis.  Copy given to patient.     Consults: None  Significant Diagnostic Studies:   Results for orders placed during  the hospital encounter of 01/29/13 (from the past 72 hour(s))  GLUCOSE, CAPILLARY     Status: Abnormal   Collection Time    01/31/13  7:26 AM      Result Value Range   Glucose-Capillary 132 (*) 70 - 99 mg/dL   Comment 1 Documented in Chart     Comment 2 Notify RN    GLUCOSE, CAPILLARY     Status: Abnormal   Collection Time    01/31/13 12:02 PM      Result Value Range   Glucose-Capillary 118 (*) 70 - 99 mg/dL  GLUCOSE, CAPILLARY     Status: Abnormal   Collection Time    01/31/13  4:18 PM      Result Value Range   Glucose-Capillary 227 (*) 70 - 99 mg/dL  GLUCOSE, CAPILLARY     Status: None   Collection Time    01/31/13 10:05 PM      Result Value Range   Glucose-Capillary 85  70 - 99 mg/dL  CBC     Status: Abnormal   Collection Time    02/01/13  4:05 AM      Result Value Range   WBC 10.2  4.0 - 10.5 K/uL   RBC 3.81 (*) 4.22 - 5.81 MIL/uL   Hemoglobin 12.7 (*) 13.0 - 17.0 g/dL   HCT 19.1 (*) 47.8 - 29.5 %   MCV 96.3  78.0 - 100.0 fL   MCH 33.3  26.0 - 34.0 pg   MCHC 34.6  30.0 - 36.0 g/dL   RDW 62.1  30.8 - 65.7 %   Platelets 244  150 - 400 K/uL  GLUCOSE, CAPILLARY  Status: Abnormal   Collection Time    02/01/13  7:35 AM      Result Value Range   Glucose-Capillary 160 (*) 70 - 99 mg/dL  GLUCOSE, CAPILLARY     Status: Abnormal   Collection Time    02/01/13 12:07 PM      Result Value Range   Glucose-Capillary 207 (*) 70 - 99 mg/dL  GLUCOSE, CAPILLARY     Status: Abnormal   Collection Time    02/01/13  5:36 PM      Result Value Range   Glucose-Capillary 129 (*) 70 - 99 mg/dL  GLUCOSE, CAPILLARY     Status: None   Collection Time    02/01/13 10:04 PM      Result Value Range   Glucose-Capillary 96  70 - 99 mg/dL  GLUCOSE, CAPILLARY     Status: Abnormal   Collection Time    02/02/13  7:45 AM      Result Value Range   Glucose-Capillary 116 (*) 70 - 99 mg/dL  GLUCOSE, CAPILLARY     Status: None   Collection Time    02/02/13 11:56 AM      Result Value Range    Glucose-Capillary 89  70 - 99 mg/dL  GLUCOSE, CAPILLARY     Status: Abnormal   Collection Time    02/02/13  4:47 PM      Result Value Range   Glucose-Capillary 104 (*) 70 - 99 mg/dL  GLUCOSE, CAPILLARY     Status: Abnormal   Collection Time    02/02/13  9:42 PM      Result Value Range   Glucose-Capillary 107 (*) 70 - 99 mg/dL    Treatments:   POST-OPERATIVE DIAGNOSIS: diverticulitis with colovesicle and colocutaneous fistula  PROCEDURE: Procedure(s):  LYSIS OF ADHESIONS  SPLENIC FLEXURE MOBILIZATION  LAPAROSCOPIC SIGMOID COLECTOMY  OMENTOPEXY of bladder at colovesical fistula  RIGID PROCTOSCOPY  SURGEON: Surgeon(s):  Ardeth Sportsman, MD  Antony Haste, MD  Shelly Rubenstein, MD - assit   Diagnosis Colon, segmental resection, recto-sigmoid - ACUTE DIVERTICULITIS WITH ASSOCIATED ACUTE SEROSITIS AND DIVERTICULOSIS. - NO ATYPIA OR MALIGNANCY. - NINE BENIGN LYMPH NODES, NEGATIVE FOR NEOPLASM (0/9). - RESECTION MARGINS VIABLE. Gwendolyn Grant LI MD Pathologist, Electronic Signature (Case signed 02/02/2013)  Discharge Exam: Blood pressure 133/84, pulse 72, temperature 97.4 F (36.3 C), temperature source Oral, resp. rate 18, height 6\' 1"  (1.854 m), weight 139 lb 12.4 oz (63.4 kg), SpO2 95.00%.  General: Pt awake/alert/oriented x4 in no major acute distress.  Sitting in bed reading paper.  Moving well Eyes: PERRL, normal EOM. Sclera nonicteric Neuro: CN II-XII intact w/o focal sensory/motor deficits. Lymph: No head/neck/groin lymphadenopathy Psych:  No delerium/psychosis/paranoia HENT: Normocephalic, Mucus membranes moist.  No thrush Neck: Supple, No tracheal deviation Chest: No pain.  Good respiratory excursion. CV:  Pulses intact.  Regular rhythm MS: Normal AROM mjr joints.  No obvious deformity Abdomen: Soft, Nondistended.  Nontender.  No incarcerated hernias.  Incisions c/d/i Ext:  SCDs BLE.  No significant edema.  No cyanosis Skin: No petechiae /  purpura   Disposition: 01-Home or Self Care  Discharge Orders   Future Orders Complete By Expires   Call MD for:  extreme fatigue  As directed    Call MD for:  hives  As directed    Call MD for:  persistant nausea and vomiting  As directed    Call MD for:  redness, tenderness, or signs of infection (pain, swelling, redness, odor or  green/yellow discharge around incision site)  As directed    Call MD for:  Dawson uncontrolled pain  As directed    Call MD for:  As directed    Comments:     Temperature > 101.33F   Diet - low sodium heart healthy  As directed    Discharge instructions  As directed    Comments:     Please see discharge instruction sheets.  Also refer to handout given an office.  Please call our office if you have any questions or concerns [phone]   Discharge wound care:  As directed    Comments:     If you have closed incisions, shower and bathe over these incisions with soap and water every day.  Remove all surgical dressings on postoperative day #3.  You do not need to replace dressings over the closed incisions unless you feel more comfortable with a Band-Aid covering it.   If you have an open wound that requires packing, please see wound care instructions.  In general, remove all dressings, wash wound with soap and water and then replace with saline moistened gauze.  Do the dressing change at least every day.  Please call our office [phone] if you have further questions.   Driving Restrictions  As directed    Comments:     No driving until off narcotics and can safely swerve away without pain during an emergency   Increase activity slowly  As directed    Comments:     Walk an hour a day.  Use 20-30 minute walks.  When you can walk 30 minutes without difficulty, increase to low impact/moderate activities such as biking, jogging, swimming, sexual activity..  Eventually can increase to unrestricted activity when not feeling pain.  If you feel pain: STOP!Marland Kitchen   Let  pain protect you from overdoing it.  Use ice/heat/over-the-counter pain medications to help minimize his soreness.  Use pain prescriptions as needed to remain active.  It is better to take extra pain medications and be more active than to stay bedridden to avoid all pain medications.   Lifting restrictions  As directed    Comments:     Avoid heavy lifting initially.  Do not push through pain.  You have no specific weight limit.  Coughing and sneezing or four more stressful to your incision than any lifting you will do. Pain will protect you from injury.  Therefore, avoid intense activity until off all narcotic pain medications.  Coughing and sneezing or four more stressful to your incision than any lifting he will do.   May shower / Bathe  As directed    May walk up steps  As directed    Sexual Activity Restrictions  As directed    Comments:     Sexual activity as tolerated.  Do not push through pain.  Pain will protect you from injury.   Walk with assistance  As directed    Comments:     Walk over an hour a day.  May use a walker/cane/companion to help with balance and stamina.       Medication List         aspirin 81 MG tablet  Take 81 mg by mouth daily.     BIOTIN PO  Take 1 tablet by mouth daily.     fish oil-omega-3 fatty acids 1000 MG capsule  Take 1 g by mouth daily.     glipiZIDE 5 MG tablet  Commonly known as:  GLUCOTROL  Take 2.5 mg by mouth daily before breakfast.     metoprolol 50 MG tablet  Commonly known as:  LOPRESSOR  Take 50 mg by mouth 2 (two) times daily.     multivitamin tablet  Take 1 tablet by mouth daily.     Oxycodone HCl 10 MG Tabs  Take 0.5-1 tablets (5-10 mg total) by mouth every 6 (six) hours as needed.     simvastatin 40 MG tablet  Commonly known as:  ZOCOR  Take 40 mg by mouth every evening.     VITAMIN B 12 PO  Take by mouth as directed.           Follow-up Information   Follow up with Mena Goes Lowella Petties, MD In 2 weeks.    Specialty:  Urology   Contact information:   [address] [place] [address] [phone]       Follow up with Ardeth Sportsman., MD. Schedule an appointment as soon as possible for a visit in 2 weeks.   Specialty:  General Surgery   Contact information:   [address] [place] [address] [phone]       Signed: Ardeth Sportsman 02/03/2013, 6:39 AM

## 2013-02-05 ENCOUNTER — Telehealth (INDEPENDENT_AMBULATORY_CARE_PROVIDER_SITE_OTHER): Payer: Self-pay

## 2013-02-05 NOTE — Telephone Encounter (Signed)
Called pt to check on him after being home from the hospital this week. The pt said he is doing ok that I woke him from sleeping. I asked if the pt was eating ok and moving his bowels since he has been home the pt said yes that is going good. The pt has a f/u appt with Dr Michaell Cowing for 02/26/13. I advised pt to call our office any questions or concerns.

## 2013-02-10 DIAGNOSIS — N321 Vesicointestinal fistula: Secondary | ICD-10-CM | POA: Diagnosis not present

## 2013-02-26 ENCOUNTER — Encounter (INDEPENDENT_AMBULATORY_CARE_PROVIDER_SITE_OTHER): Payer: Self-pay

## 2013-02-26 ENCOUNTER — Encounter (INDEPENDENT_AMBULATORY_CARE_PROVIDER_SITE_OTHER): Payer: Self-pay | Admitting: Surgery

## 2013-02-26 ENCOUNTER — Ambulatory Visit (INDEPENDENT_AMBULATORY_CARE_PROVIDER_SITE_OTHER): Payer: Medicare Other | Admitting: Surgery

## 2013-02-26 VITALS — BP 122/84 | HR 60 | Temp 97.3°F | Resp 16 | Ht 74.0 in | Wt 143.8 lb

## 2013-02-26 DIAGNOSIS — N321 Vesicointestinal fistula: Secondary | ICD-10-CM

## 2013-02-26 DIAGNOSIS — K632 Fistula of intestine: Secondary | ICD-10-CM

## 2013-02-26 NOTE — Progress Notes (Signed)
Subjective:     Patient ID: [name], male   DOB: 03-07-1941, 72 y.o.   MRN: [id number]  HPI  RAYDAN SCHLABACH  05/05/1940 [id number]  Patient Care Team: Gabriel Cirri as PCP - General (Family Medicine) Vertell Novak., MD as Consulting Physician (Gastroenterology) Ardeth Sportsman, MD as Consulting Physician (General Surgery) Antony Haste, MD as Consulting Physician (Urology)  Procedure (Date: 01/29/2013):  POST-OPERATIVE DIAGNOSIS: diverticulitis with colovesicle and colocutaneous fistula   PROCEDURE: Procedure(s):  LYSIS OF ADHESIONS  SPLENIC FLEXURE MOBILIZATION  LAPAROSCOPIC SIGMOID COLECTOMY  OMENTOPEXY of bladder at colovesical fistula  RIGID PROCTOSCOPY   SURGEON: Surgeon(s):  Ardeth Sportsman, MD  Antony Haste, MD  Shelly Rubenstein, MD - asst  Diagnosis Colon, segmental resection, recto-sigmoid - ACUTE DIVERTICULITIS WITH ASSOCIATED ACUTE SEROSITIS AND DIVERTICULOSIS. - NO ATYPIA OR MALIGNANCY. - NINE BENIGN LYMPH NODES, NEGATIVE FOR NEOPLASM (0/9). - RESECTION MARGINS VIABLE. Abigail Miyamoto MD Pathologist, Electronic Signature (Case signed 02/02/2013)  This patient returns for surgical re-evaluation.  He is getting better.  He has been walking a couple miles a day with his dogs.  Being able to handle it feels better now.  Appetite and coming back.  Take his Metamucil.  Having 2 bowel movements a day.  No severe constipation or diarrhea.  He will some soreness on the left lateral aspect of his incision.  He then hopes that.  No fevers or chills.  Notes gurgling in his abdomen.  Lost about 5 pounds.  Was concerned about 5 lb weight loss.  Concerned he is stamina runs out in the afternoon.  She does believe is gradually getting better.  Foley catheter is out.  He is voiding on his own.  No rectal pain.  No rectal urgency.  No hematuria.  No pneumaturia  Patient Active Problem List   Diagnosis Date Noted  . Colovesical fistula -  diverticular s/p lap colectomy/repair 01/30/13 12/24/2012  . Colocutaneous fistula - diverticular - s/p lap colectomy/repair 01/30/13 12/24/2012  . Diverticulitis - recurrent 12/08/2012  . Diabetes mellitus 11/10/2012  . Smoking 11/25/2011  . Pre-operative clearance 05/08/2011  . Bradycardia 11/27/2010  . CAD (coronary artery disease) 11/27/2010  . Hyperlipidemia 11/27/2010  . HTN (hypertension) 11/27/2010  . Subclavian steal syndrome 11/27/2010    Past Medical History  Diagnosis Date  . Diabetes mellitus   . Hyperlipidemia     Is on statin therapy  . Diverticulitis 5621-3086    "all my life"  . Normal nuclear stress test 2010    EF 63% with no ischemia  . Skin cancer   . Bladder cancer   . Hypertension     LOV  Dr Shirlee Latch with clearance 05/08/11 and EKG  in [facility]   . CAD (coronary artery disease)     Mild per cath in 2002/last nuclear stress per note  2010  . Colonoscopy refused     Sister died from delayed Dx of post-colonoscopy   . Subclavian steal syndrome     left; evaluated by [name]; managed medically , no accurate B/p left arm  . Colonic diverticular abscess s/p perc drainage x2 11/10/2012  . Abscess of abdominal wall 11/28/2012    Past Surgical History  Procedure Laterality Date  . Cardiac catheterization  2002    He had a which showed mild coronary atherosclerosis and normal left ventricular function.  . Rotator cuff repair      right  . Tonsillectomy    . Appendectomy    .  Cystoscopy with injection      cystoscopy with BCG injections  . Transurethral resection of prostate  05/17/2011    Procedure: TRANSURETHRAL RESECTION OF THE PROSTATE WITH GYRUS INSTRUMENTS;  Surgeon: Anner Crete, MD;  Location: [facility];  Service: Urology;  Laterality: N/A;  . Peritoneal wound drainage  01-23-13    implant of abdomianl drain  . Cataract extraction, bilateral Bilateral 01-23-13    11'13  . Laparoscopic sigmoid colectomy N/A 01/29/2013    Procedure: SPLENIC FLEXOR  MOBILIZATION,LAPAROSCOPIC SIGMOID COLECTOMY, LYSIS OF ADHESIONS, OMENTOPEXY, RIGID PROCTOSCOPY;  Surgeon: Ardeth Sportsman, MD;  Location: [facility];  Service: General;  Laterality: N/A;  . Fistulotomy N/A 01/29/2013    Procedure: repair colovesical fustula ;  Surgeon: Ardeth Sportsman, MD;  Location: [facility];  Service: General;  Laterality: N/A;  . Cystoscopy w/ ureteral stent placement Bilateral 01/29/2013    Procedure: CYSTOSCOPY WITH BILATERAL RETROGRADE PYELOGRAM/BILATERAL URETERAL STENT PLACEMENT;  Surgeon: Antony Haste, MD;  Location: [facility];  Service: Urology;  Laterality: Bilateral;    History   Social History  . Marital Status: Married    Spouse Name: N/A    Number of Children: N/A  . Years of Education: N/A   Occupational History  . Not on file.   Social History Main Topics  . Smoking status: Former Smoker -- 55 years    Types: Pipe    Quit date: 05/14/1955  . Smokeless tobacco: Former Neurosurgeon  . Alcohol Use: No  . Drug Use: No  . Sexual Activity: Yes   Other Topics Concern  . Not on file   Social History Narrative  . No narrative on file    Family History  Problem Relation Age of Onset  . Hypertension Mother   . Lung cancer Mother     Current Outpatient Prescriptions  Medication Sig Dispense Refill  . aspirin 81 MG tablet Take 81 mg by mouth daily.      Marland Kitchen BIOTIN PO Take 1 tablet by mouth daily.       . Cyanocobalamin (VITAMIN B 12 PO) Take by mouth as directed.      . fish oil-omega-3 fatty acids 1000 MG capsule Take 1 g by mouth daily.       Marland Kitchen glipiZIDE (GLUCOTROL) 5 MG tablet Take 2.5 mg by mouth daily before breakfast.       . metoprolol (LOPRESSOR) 50 MG tablet Take 50 mg by mouth 2 (two) times daily.      . Multiple Vitamin (MULTIVITAMIN) tablet Take 1 tablet by mouth daily.      . simvastatin (ZOCOR) 40 MG tablet Take 40 mg by mouth every evening.      . dicyclomine (BENTYL) 20 MG tablet Take 20 mg by mouth every 6 (six) hours.       No current  facility-administered medications for this visit.     Allergies  Allergen Reactions  . Dilaudid [Hydromorphone Hcl] Other (See Comments)    Very sedated with Dilaudid.  Tolerates fentanyl better    BP 122/84  Pulse 60  Temp(Src) 97.3 F (36.3 C) (Temporal)  Resp 16  Ht 6\' 2"  (1.88 m)  Wt 143 lb 12.8 oz (65.227 kg)  BMI 18.45 kg/m2  Ct Head Wo Contrast  01/29/2013   CLINICAL DATA:  Altered mental status. Unresponsive patient.  EXAM: CT HEAD WITHOUT CONTRAST  TECHNIQUE: Contiguous axial images were obtained from the base of the skull through the vertex without intravenous contrast.  COMPARISON:  No  priors.  FINDINGS: Well-defined foci of low attenuation in the inferior aspects of the basal ganglia bilaterally may represent old lacunar infarctions or prominent dilated perivascular spaces. No acute intracranial abnormalities. Specifically, no evidence of acute intracranial hemorrhage, no definite findings of acute/subacute cerebral ischemia, no mass, mass effect, hydrocephalus or abnormal intra or extra-axial fluid collections. Visualized paranasal sinuses and mastoids are well pneumatized. No acute displaced skull fractures are identified.  IMPRESSION: 1. No acute intracranial abnormalities. 2. Old lacunar infarctions or prominent dilated perivascular spaces in the inferior aspects of the basal ganglia bilaterally.   Electronically Signed   By: Trudie Reed M.D.   On: 01/29/2013 20:24   Dg C-arm 1-60 Min-no Report  01/29/2013   CLINICAL DATA: stent placement   C-ARM 1-60 MINUTES  Fluoroscopy was utilized by the requesting physician.  No radiographic  interpretation.      Review of Systems  Constitutional: Negative for fever, chills and diaphoresis.  HENT: Negative for ear discharge, facial swelling, mouth sores, nosebleeds, sore throat and trouble swallowing.   Eyes: Negative for photophobia, discharge and visual disturbance.  Respiratory: Negative for choking, chest tightness,  shortness of breath and stridor.   Cardiovascular: Negative for chest pain and palpitations.  Gastrointestinal: Negative for nausea, vomiting, diarrhea, constipation, blood in stool, abdominal distention, anal bleeding and rectal pain.  Endocrine: Negative for cold intolerance and heat intolerance.  Genitourinary: Negative for dysuria, urgency, difficulty urinating and testicular pain.  Musculoskeletal: Positive for myalgias. Negative for arthralgias, back pain, gait problem and neck pain.  Skin: Negative for color change, pallor, rash and wound.  Allergic/Immunologic: Negative for environmental allergies and food allergies.  Neurological: Negative for dizziness, speech difficulty, weakness, numbness and headaches.  Hematological: Negative for adenopathy. Does not bruise/bleed easily.  Psychiatric/Behavioral: Negative for hallucinations, confusion and agitation.       Objective:   Physical Exam  Constitutional: He is oriented to person, place, and time. He appears well-developed and well-nourished. No distress.  HENT:  Head: Normocephalic.  Mouth/Throat: Oropharynx is clear and moist. No oropharyngeal exudate.  Eyes: Conjunctivae and EOM are normal. Pupils are equal, round, and reactive to light. No scleral icterus.  Neck: Normal range of motion. No tracheal deviation present.  Cardiovascular: Normal rate, normal heart sounds and intact distal pulses.   Pulmonary/Chest: Effort normal. No respiratory distress.  Abdominal: Soft. He exhibits no distension. There is no tenderness. Hernia confirmed negative in the ventral area, confirmed negative in the right inguinal area and confirmed negative in the left inguinal area.    Incisions clean with normal healing ridges.  No hernias  Musculoskeletal: Normal range of motion. He exhibits no tenderness.  Neurological: He is alert and oriented to person, place, and time. No cranial nerve deficit. He exhibits normal muscle tone. Coordination normal.    Skin: Skin is warm and dry. No rash noted. He is not diaphoretic.  Psychiatric: He has a normal mood and affect. His behavior is normal.       Assessment:     Recovering rather well so far status post lap assisted colectomy and repair of colovesical fistula 3 weeks ago.     Plan:     Increase activity as tolerated to regular activity.  Low impact exercise such as walking an hour a day at least ideal.  Do not push through pain.  I tried to reassure his wife that stamina will improve with time.  I think he is doing remarkably well given his age and how significant his  diverticulitis was.  Soreness is down.  Appetite is fair.  Stamina is better.  I see no warning signs for another abscess or a leak.  They seemed reassured.  Diet as tolerated.  Low fat high fiber diet ideal.  Bowel regimen with 30 g fiber a day and fiber supplement as needed to avoid problems.I anticipate he will regain his weight over the next few months.  I offered to see him in another 3-4 weeks, and another option is just to consider calling us if he needs it.  Because he lives a ways away feels confident he will continue to improve, he was comfortable with just returning to clinic as needed.   Instructions discussed.  Followup with primary care physician for other health issues as would normally be done.  Questions answered.  The patient expressed understanding and appreciation

## 2013-02-26 NOTE — Patient Instructions (Signed)
ABDOMINAL SURGERY: POST OP INSTRUCTIONS  1. DIET: Follow a light bland diet the first 24 hours after arrival home, such as soup, liquids, crackers, etc.  Be sure to include lots of fluids daily.  Avoid fast food or heavy meals as your are more likely to get nauseated.  Eat a low fat the next few days after surgery.   2. Take your usually prescribed home medications unless otherwise directed. 3. PAIN CONTROL: a. Pain is best controlled by a usual combination of three different methods TOGETHER: i. Ice/Heat ii. Over the counter pain medication iii. Prescription pain medication b. Most patients will experience some swelling and bruising around the incisions.  Ice packs or heating pads (30-60 minutes up to 6 times a day) will help. Use ice for the first few days to help decrease swelling and bruising, then switch to heat to help relax tight/sore spots and speed recovery.  Some people prefer to use ice alone, heat alone, alternating between ice & heat.  Experiment to what works for you.  Swelling and bruising can take several weeks to resolve.   c. It is helpful to take an over-the-counter pain medication regularly for the first few weeks.  Choose one of the following that works best for you: i. Naproxen (Aleve, etc)  Two 262m tabs twice a day ii. Ibuprofen (Advil, etc) Three 2066mtabs four times a day (every meal & bedtime) iii. Acetaminophen (Tylenol, etc) 500-65011mour times a day (every meal & bedtime) d. A  prescription for pain medication (such as oxycodone, hydrocodone, etc) should be given to you upon discharge.  Take your pain medication as prescribed.  i. If you are having problems/concerns with the prescription medicine (does not control pain, nausea, vomiting, rash, itching, etc), please call us [phone] see if we need to switch you to a different pain medicine that will work better for you and/or control your side effect better. ii. If you need a refill on your pain medication,  please contact your pharmacy.  They will contact our office to request authorization. Prescriptions will not be filled after 5 pm or on week-ends. 4. Avoid getting constipated.  Between the surgery and the pain medications, it is common to experience some constipation.  Increasing fluid intake and taking a fiber supplement (such as Metamucil, Citrucel, FiberCon, MiraLax, etc) 1-2 times a day regularly will usually help prevent this problem from occurring.  A mild laxative (prune juice, Milk of Magnesia, MiraLax, etc) should be taken according to package directions if there are no bowel movements after 48 hours.   5. Watch out for diarrhea.  If you have many loose bowel movements, simplify your diet to bland foods & liquids for a few days.  Stop any stool softeners and decrease your fiber supplement.  Switching to mild anti-diarrheal medications (Kayopectate, Pepto Bismol) can help.  If this worsens or does not improve, please call us.[place]. Wash / shower every day.  You may shower over the incision / wound.  Avoid baths until the skin is fully healed.  Continue to shower over incision(s) after the dressing is off. 7. Remove your waterproof bandages 5 days after surgery.  You may leave the incision open to air.  You may replace a dressing/Band-Aid to cover the incision for comfort if you wish. 8. ACTIVITIES as tolerated:   a. You may resume regular (light) daily activities beginning the next day-such as daily self-care, walking, climbing stairs-gradually increasing activities as tolerated.  If you can  walk 30 minutes without difficulty, it is safe to try more intense activity such as jogging, treadmill, bicycling, low-impact aerobics, swimming, etc. °b. Save the most intensive and strenuous activity for last such as sit-ups, heavy lifting, contact sports, etc  Refrain from any heavy lifting or straining until you are off narcotics for pain control.   °c. DO NOT PUSH THROUGH PAIN.  Let pain be your guide: If it  hurts to do something, don't do it.  Pain is your body warning you to avoid that activity for another week until the pain goes down. °d. You may drive when you are no longer taking prescription pain medication, you can comfortably wear a seatbelt, and you can safely maneuver your car and apply brakes. °e. You may have sexual intercourse when it is comfortable.  °9. FOLLOW UP in our office °a. Please call [facility] at [phone] to set up an appointment to see your surgeon in the office for a follow-up appointment approximately 1-2 weeks after your surgery. °b. Make sure that you call for this appointment the day you arrive home to insure a convenient appointment time. °10. IF YOU HAVE DISABILITY OR FAMILY LEAVE FORMS, BRING THEM TO THE OFFICE FOR PROCESSING.  DO NOT GIVE THEM TO YOUR DOCTOR. ° ° °WHEN TO CALL US [phone]: °1. Poor pain control °2. Reactions / problems with new medications (rash/itching, nausea, etc)  °3. Fever over 101.5 F (38.5 C) °4. Inability to urinate °5. Nausea and/or vomiting °6. Worsening swelling or bruising °7. Continued bleeding from incision. °8. Increased pain, redness, or drainage from the incision ° °The clinic staff is available to answer your questions during regular business hours (8:30am-5pm).  Please don’t hesitate to call and ask to speak to one of our nurses for clinical concerns.   A surgeon from [facility] is always on call at the hospitals °  °If you have a medical emergency, go to the nearest emergency room or call 911. °  ° °[facility] °[address], [address], [place], [place]  [address] ? °MAIN: [phone] ? TOLL FREE: [phone] ? °FAX [phone] °[email or web address] ° ° °Managing Pain ° °Pain after surgery or related to activity is often due to strain/injury to muscle, tendon, nerves and/or incisions.  This pain is usually short-term and will improve in a few months.  ° °Many people find it helpful to do  the following things TOGETHER to help speed the process of healing and to get back to regular activity more quickly: ° °1. Avoid heavy physical activity °a.  no lifting greater than 20 pounds °b. Do not “push through” the pain.  Listen to your body and avoid positions and maneuvers than reproduce the pain °c. Walking is okay as tolerated, but go slowly and stop when getting sore.  °d. Remember: If it hurts to do it, then don’t do it! °2. Take Anti-inflammatory medication  °a. Take with food/snack around the clock for 1-2 weeks °i. This helps the muscle and nerve tissues become less irritable and calm down faster °b. Choose ONE of the following over-the-counter medications: °i. Naproxen 220mg tabs (ex. Aleve) 1-2 pills twice a day  °ii. Ibuprofen 200mg tabs (ex. Advil, Motrin) 3-4 pills with every meal and just before bedtime °iii. Acetaminophen 500mg tabs (Tylenol) 1-2 pills with every meal and just before bedtime °3. Use a Heating pad or Ice/Cold Pack °a. 4-6 times a day °b. May use warm bath/hottub  or showers °4.   Gentle Massage and/or Stretching  a. at the area of pain many times a day b. stop if you feel pain - do not overdo it  Try these steps together to help you body heal faster and avoid making things get worse.  Doing just one of these things may not be enough.    If you are not getting better after two weeks or are noticing you are getting worse, contact our office for further advice; we may need to re-evaluate you & see what other things we can do to help.   GETTING TO GOOD BOWEL HEALTH. Irregular bowel habits such as constipation and diarrhea can lead to many problems over time.  Having one soft bowel movement a day is the most important way to prevent further problems.  The anorectal canal is designed to handle stretching and feces to safely manage our ability to get rid of solid waste (feces, poop, stool) out of our body.  BUT, hard constipated stools can act like ripping concrete bricks  and diarrhea can be a burning fire to this very sensitive area of our body, causing inflamed hemorrhoids, anal fissures, increasing risk is perirectal abscesses, abdominal pain/bloating, an making irritable bowel worse.     The goal: ONE SOFT BOWEL MOVEMENT A DAY!  To have soft, regular bowel movements:    Drink at least 8 tall glasses of water a day.     Take plenty of fiber.  Fiber is the undigested part of plant food that passes into the colon, acting s "natures broom" to encourage bowel motility and movement.  Fiber can absorb and hold large amounts of water. This results in a larger, bulkier stool, which is soft and easier to pass. Work gradually over several weeks up to 6 servings a day of fiber (25g a day even more if needed) in the form of: o Vegetables -- Root (potatoes, carrots, turnips), leafy green (lettuce, salad greens, celery, spinach), or cooked high residue (cabbage, broccoli, etc) o Fruit -- Fresh (unpeeled skin & pulp), Dried (prunes, apricots, cherries, etc ),  or stewed ( applesauce)  o Whole grain breads, pasta, etc (whole wheat)  o Bran cereals    Bulking Agents -- This type of water-retaining fiber generally is easily obtained each day by one of the following:  o Psyllium bran -- The psyllium plant is remarkable because its ground seeds can retain so much water. This product is available as Metamucil, Konsyl, Effersyllium, Per Diem Fiber, or the less expensive generic preparation in drug and health food stores. Although labeled a laxative, it really is not a laxative.  o Methylcellulose -- This is another fiber derived from wood which also retains water. It is available as Citrucel. o Polyethylene Glycol - and "artificial" fiber commonly called Miralax or Glycolax.  It is helpful for people with gassy or bloated feelings with regular fiber o Flax Seed - a less gassy fiber than psyllium   No reading or other relaxing activity while on the toilet. If bowel movements take longer  than 5 minutes, you are too constipated   AVOID CONSTIPATION.  High fiber and water intake usually takes care of this.  Sometimes a laxative is needed to stimulate more frequent bowel movements, but    Laxatives are not a good long-term solution as it can wear the colon out. o Osmotics (Milk of Magnesia, Fleets phosphosoda, Magnesium citrate, MiraLax, GoLytely) are safer than  o Stimulants (Senokot, Castor Oil, Dulcolax, Ex Lax)    o Do  not take laxatives for more than 7days in a row.    IF SEVERELY CONSTIPATED, try a Bowel Retraining Program: o Do not use laxatives.  o Eat a diet high in roughage, such as bran cereals and leafy vegetables.  o Drink six (6) ounces of prune or apricot juice each morning.  o Eat two (2) large servings of stewed fruit each day.  o Take one (1) heaping tablespoon of a psyllium-based bulking agent twice a day. Use sugar-free sweetener when possible to avoid excessive calories.  o Eat a normal breakfast.  o Set aside 15 minutes after breakfast to sit on the toilet, but do not strain to have a bowel movement.  o If you do not have a bowel movement by the third day, use an enema and repeat the above steps.    Controlling diarrhea o Switch to liquids and simpler foods for a few days to avoid stressing your intestines further. o Avoid dairy products (especially milk & ice cream) for a short time.  The intestines often can lose the ability to digest lactose when stressed. o Avoid foods that cause gassiness or bloating.  Typical foods include beans and other legumes, cabbage, broccoli, and dairy foods.  Every person has some sensitivity to other foods, so listen to our body and avoid those foods that trigger problems for you. o Adding fiber (Citrucel, Metamucil, psyllium, Miralax) gradually can help thicken stools by absorbing excess fluid and retrain the intestines to act more normally.  Slowly increase the dose over a few weeks.  Too much fiber too soon can backfire and  cause cramping & bloating. o Probiotics (such as active yogurt, Align, etc) may help repopulate the intestines and colon with normal bacteria and calm down a sensitive digestive tract.  Most studies show it to be of mild help, though, and such products can be costly. o Medicines:   Bismuth subsalicylate (ex. Kayopectate, Pepto Bismol) every 30 minutes for up to 6 doses can help control diarrhea.  Avoid if pregnant.   Loperamide (Immodium) can slow down diarrhea.  Start with two tablets (4mg  total) first and then try one tablet every 6 hours.  Avoid if you are having fevers or severe pain.  If you are not better or start feeling worse, stop all medicines and call your doctor for advice o Call your doctor if you are getting worse or not better.  Sometimes further testing (cultures, endoscopy, X-ray studies, bloodwork, etc) may be needed to help diagnose and treat the cause of the diarrhea.  Diverticulosis Diverticulosis is a common condition that develops when small pouches (diverticula) form in the wall of the colon. The risk of diverticulosis increases with age. It happens more often in people who eat a low-fiber diet. Most individuals with diverticulosis have no symptoms. Those individuals with symptoms usually experience abdominal pain, constipation, or loose stools (diarrhea). HOME CARE INSTRUCTIONS   Increase the amount of fiber in your diet as directed by your caregiver or dietician. This may reduce symptoms of diverticulosis.  Your caregiver may recommend taking a dietary fiber supplement.  Drink at least 6 to 8 glasses of water each day to prevent constipation.  Try not to strain when you have a bowel movement.  Your caregiver may recommend avoiding nuts and seeds to prevent complications, although this is still an uncertain benefit.  Only take over-the-counter or prescription medicines for pain, discomfort, or fever as directed by your caregiver. FOODS WITH HIGH FIBER CONTENT  INCLUDE:  Fruits.  Apple, peach, pear, tangerine, raisins, prunes.  Vegetables. Brussels sprouts, asparagus, broccoli, cabbage, carrot, cauliflower, romaine lettuce, spinach, summer squash, tomato, winter squash, zucchini.  Starchy Vegetables. Baked beans, kidney beans, lima beans, split peas, lentils, potatoes (with skin).  Grains. Whole wheat bread, brown rice, bran flake cereal, plain oatmeal, white rice, shredded wheat, bran muffins. SEEK IMMEDIATE MEDICAL CARE IF:   You develop increasing pain or severe bloating.  You have an oral temperature above 102 F (38.9 C), not controlled by medicine.  You develop vomiting or bowel movements that are bloody or black. Document Released: 12/29/2003 Document Revised: 06/25/2011 Document Reviewed: 08/31/2009 [facility] Patient Information 2014 [place], [place].

## 2013-03-24 DIAGNOSIS — E119 Type 2 diabetes mellitus without complications: Secondary | ICD-10-CM | POA: Diagnosis not present

## 2013-03-24 DIAGNOSIS — I10 Essential (primary) hypertension: Secondary | ICD-10-CM | POA: Diagnosis not present

## 2013-03-24 DIAGNOSIS — E785 Hyperlipidemia, unspecified: Secondary | ICD-10-CM | POA: Diagnosis not present

## 2013-03-24 DIAGNOSIS — J309 Allergic rhinitis, unspecified: Secondary | ICD-10-CM | POA: Diagnosis not present

## 2013-06-25 DIAGNOSIS — I10 Essential (primary) hypertension: Secondary | ICD-10-CM | POA: Diagnosis not present

## 2013-06-25 DIAGNOSIS — F17200 Nicotine dependence, unspecified, uncomplicated: Secondary | ICD-10-CM | POA: Diagnosis not present

## 2013-06-25 DIAGNOSIS — E785 Hyperlipidemia, unspecified: Secondary | ICD-10-CM | POA: Diagnosis not present

## 2013-06-25 DIAGNOSIS — E119 Type 2 diabetes mellitus without complications: Secondary | ICD-10-CM | POA: Diagnosis not present

## 2013-07-13 DIAGNOSIS — Z961 Presence of intraocular lens: Secondary | ICD-10-CM | POA: Diagnosis not present

## 2013-07-28 DIAGNOSIS — L821 Other seborrheic keratosis: Secondary | ICD-10-CM | POA: Diagnosis not present

## 2013-07-28 DIAGNOSIS — Z85828 Personal history of other malignant neoplasm of skin: Secondary | ICD-10-CM | POA: Diagnosis not present

## 2013-07-28 DIAGNOSIS — L570 Actinic keratosis: Secondary | ICD-10-CM | POA: Diagnosis not present

## 2013-07-28 DIAGNOSIS — L739 Follicular disorder, unspecified: Secondary | ICD-10-CM | POA: Diagnosis not present

## 2013-09-25 DIAGNOSIS — F17200 Nicotine dependence, unspecified, uncomplicated: Secondary | ICD-10-CM | POA: Diagnosis not present

## 2013-09-25 DIAGNOSIS — E119 Type 2 diabetes mellitus without complications: Secondary | ICD-10-CM | POA: Diagnosis not present

## 2013-09-25 DIAGNOSIS — E785 Hyperlipidemia, unspecified: Secondary | ICD-10-CM | POA: Diagnosis not present

## 2013-09-25 DIAGNOSIS — I10 Essential (primary) hypertension: Secondary | ICD-10-CM | POA: Diagnosis not present

## 2013-12-15 ENCOUNTER — Encounter: Payer: Self-pay | Admitting: *Deleted

## 2013-12-16 ENCOUNTER — Encounter: Payer: Self-pay | Admitting: *Deleted

## 2013-12-16 ENCOUNTER — Encounter: Payer: Self-pay | Admitting: Cardiology

## 2013-12-16 ENCOUNTER — Ambulatory Visit (INDEPENDENT_AMBULATORY_CARE_PROVIDER_SITE_OTHER): Payer: Medicare Other | Admitting: Cardiology

## 2013-12-16 VITALS — BP 118/60 | HR 54 | Ht 74.0 in | Wt 157.0 lb

## 2013-12-16 DIAGNOSIS — F17200 Nicotine dependence, unspecified, uncomplicated: Secondary | ICD-10-CM

## 2013-12-16 DIAGNOSIS — I708 Atherosclerosis of other arteries: Secondary | ICD-10-CM

## 2013-12-16 DIAGNOSIS — I2510 Atherosclerotic heart disease of native coronary artery without angina pectoris: Secondary | ICD-10-CM | POA: Diagnosis not present

## 2013-12-16 DIAGNOSIS — G458 Other transient cerebral ischemic attacks and related syndromes: Secondary | ICD-10-CM

## 2013-12-16 DIAGNOSIS — E785 Hyperlipidemia, unspecified: Secondary | ICD-10-CM | POA: Diagnosis not present

## 2013-12-16 DIAGNOSIS — I771 Stricture of artery: Secondary | ICD-10-CM

## 2013-12-16 MED ORDER — SIMVASTATIN 40 MG PO TABS: 40.0000 mg | ORAL_TABLET | Freq: Every evening | ORAL | Status: DC

## 2013-12-16 MED ORDER — METOPROLOL TARTRATE 50 MG PO TABS: 50.0000 mg | ORAL_TABLET | Freq: Two times a day (BID) | ORAL | Status: DC

## 2013-12-16 NOTE — Patient Instructions (Signed)
Your physician has requested that you have a carotid duplex. This test is an ultrasound of the carotid arteries in your neck. It looks at blood flow through these arteries that supply the brain with blood. Allow one hour for this exam. There are no restrictions or special instructions.  Your physician wants you to follow-up in: 1 year with Dr McLean. (September 2016). You will receive a reminder letter in the mail two months in advance. If you don't receive a letter, please call our office to schedule the follow-up appointment.   

## 2013-12-17 NOTE — [place] Notes (Signed)
Patient ID: [name], male   DOB: July 29, 1940, 73 y.o.   MRN: [id number] 73 yo with history of subclavian steal syndrome and diabetes presents for cardiology followup.  He had a cath in 2002 with minimal disease.  He has left subclavian stenosis with evidence for steal by doppler imaging, last dopplers done in 8/13.  He does not have symptoms of arm claudication or cerebral ischemia.  No chest pain, no exertional dyspnea.  He is active, walking 2+ miles five times/week.  He push mows his yard.  No lightheadedness or syncope.  He had severe diverticulitis and required partial colectomy and colovesical fistula resection in 10/14.  He still smokes a pipe daily.   Labs (6/13): K 4.7, creatinine 0.8, hgbA1c 6.3, LDL 41, HDL 46 Labs (6/14): K 4.4, creatinine 0.91, LDL 50, HDL 41 Labs (6/15): K 4.3, creatinine 0.9, LFTs normal, HDL 38, LDL 46  ECG: NSR, anteroseptal Qs, iRBBB  PMH: 1. DM 2. HTN 3. CAD: Mild on cath in 2002.  Normal myoview in 2010.  4. Hyperlipidemia 5. Subclavian stenosis on left with evidence for steal, managed medically. 8/13 carotid dopplers with normal bilateral ICAs but left subclavian stenosis with evidence for subclavian steal.  6. Mild bradycardia 7. TURP 1/13 8. Bladder cancer 9. Severe diverticulitis ultimately requiring partial colectomy with resection of colovesical fistula in 10/14.   SH: Retired, lives in [place].  Married. Smokes pipe 3-4 times a day.   FH: No premature CAD.   ROS: All systems reviewed and negative except as per HPI.    Current Outpatient Prescriptions  Medication Sig Dispense Refill  . aspirin 81 MG tablet Take 81 mg by mouth daily.      Marland Kitchen BIOTIN PO Take 1 tablet by mouth daily.       . Cyanocobalamin (VITAMIN B 12 PO) Take by mouth as directed.      . dicyclomine (BENTYL) 20 MG tablet Take 20 mg by mouth every 6 (six) hours.      . fish oil-omega-3 fatty acids 1000 MG capsule Take 1 g by mouth daily.       Marland Kitchen glipiZIDE (GLUCOTROL) 5  MG tablet Take 2.5 mg by mouth daily before breakfast.       . metoprolol (LOPRESSOR) 50 MG tablet Take 1 tablet (50 mg total) by mouth 2 (two) times daily.  180 tablet  3  . Multiple Vitamin (MULTIVITAMIN) tablet Take 1 tablet by mouth daily.      . simvastatin (ZOCOR) 40 MG tablet Take 1 tablet (40 mg total) by mouth every evening.  90 tablet  3   No current facility-administered medications for this visit.    BP 118/60  Pulse 54  Ht 6\' 2"  (1.88 m)  Wt 157 lb (71.215 kg)  BMI 20.15 kg/m2 General: NAD Neck: No JVD, no thyromegaly or thyroid nodule.  Lungs: Clear to auscultation bilaterally with normal respiratory effort. CV: Nondisplaced PMI.  Heart regular S1/S2, no S3/S4, no murmur.  No peripheral edema.  Murmur across upper left precordium, left shoulder, left side of the neck.  Normal pedal pulses.  Left radial pulse palpable but weak, 2+ on right.  Abdomen: Soft, nontender, no hepatosplenomegaly, no distention.  Neurologic: Alert and oriented x 3.  Psych: Normal affect. Extremities: No clubbing or cyanosis.   Assessment/Plan: 1. Left subclavian stenosis: With subclavian steal physiology by dopplers in 2013.  No arm claudication, no stroke-like symptoms.  Continue to manage medically.  Will arrange for repeat doppler evaluation.  2. Hyperlipidemia: Good lipids when checked recently.  Continue statin.  Goal LDL < 70 with known vascular disease.  3. Smoking: Still smokes pipe.  I told him again he ought to quit.   Loralie Champagne 12/17/2013

## 2013-12-25 DIAGNOSIS — J40 Bronchitis, not specified as acute or chronic: Secondary | ICD-10-CM | POA: Diagnosis not present

## 2013-12-25 DIAGNOSIS — I10 Essential (primary) hypertension: Secondary | ICD-10-CM | POA: Diagnosis not present

## 2013-12-25 DIAGNOSIS — Z9181 History of falling: Secondary | ICD-10-CM | POA: Diagnosis not present

## 2013-12-25 DIAGNOSIS — E785 Hyperlipidemia, unspecified: Secondary | ICD-10-CM | POA: Diagnosis not present

## 2013-12-25 DIAGNOSIS — E119 Type 2 diabetes mellitus without complications: Secondary | ICD-10-CM | POA: Diagnosis not present

## 2013-12-25 DIAGNOSIS — Z1331 Encounter for screening for depression: Secondary | ICD-10-CM | POA: Diagnosis not present

## 2013-12-29 ENCOUNTER — Ambulatory Visit (HOSPITAL_COMMUNITY): Payer: Medicare Other | Attending: Cardiology | Admitting: Cardiology

## 2013-12-29 DIAGNOSIS — I2510 Atherosclerotic heart disease of native coronary artery without angina pectoris: Secondary | ICD-10-CM

## 2013-12-29 DIAGNOSIS — I10 Essential (primary) hypertension: Secondary | ICD-10-CM | POA: Diagnosis not present

## 2013-12-29 DIAGNOSIS — I6529 Occlusion and stenosis of unspecified carotid artery: Secondary | ICD-10-CM | POA: Diagnosis not present

## 2013-12-29 DIAGNOSIS — F17200 Nicotine dependence, unspecified, uncomplicated: Secondary | ICD-10-CM | POA: Diagnosis not present

## 2013-12-29 DIAGNOSIS — E785 Hyperlipidemia, unspecified: Secondary | ICD-10-CM | POA: Diagnosis not present

## 2013-12-29 DIAGNOSIS — I771 Stricture of artery: Secondary | ICD-10-CM

## 2013-12-29 DIAGNOSIS — G458 Other transient cerebral ischemic attacks and related syndromes: Secondary | ICD-10-CM | POA: Diagnosis not present

## 2013-12-29 NOTE — [place] Notes (Signed)
Carotid duplex performed 

## 2014-01-11 DIAGNOSIS — J209 Acute bronchitis, unspecified: Secondary | ICD-10-CM | POA: Diagnosis not present

## 2014-01-15 DIAGNOSIS — Z23 Encounter for immunization: Secondary | ICD-10-CM | POA: Diagnosis not present

## 2014-01-18 DIAGNOSIS — Z8551 Personal history of malignant neoplasm of bladder: Secondary | ICD-10-CM | POA: Diagnosis not present

## 2014-01-18 DIAGNOSIS — R3912 Poor urinary stream: Secondary | ICD-10-CM | POA: Diagnosis not present

## 2014-01-18 DIAGNOSIS — N210 Calculus in bladder: Secondary | ICD-10-CM | POA: Diagnosis not present

## 2014-01-18 DIAGNOSIS — N401 Enlarged prostate with lower urinary tract symptoms: Secondary | ICD-10-CM | POA: Diagnosis not present

## 2014-03-22 DIAGNOSIS — N401 Enlarged prostate with lower urinary tract symptoms: Secondary | ICD-10-CM | POA: Diagnosis not present

## 2014-03-26 DIAGNOSIS — E785 Hyperlipidemia, unspecified: Secondary | ICD-10-CM | POA: Diagnosis not present

## 2014-03-26 DIAGNOSIS — I10 Essential (primary) hypertension: Secondary | ICD-10-CM | POA: Diagnosis not present

## 2014-03-26 DIAGNOSIS — E119 Type 2 diabetes mellitus without complications: Secondary | ICD-10-CM | POA: Diagnosis not present

## 2014-03-29 DIAGNOSIS — R3912 Poor urinary stream: Secondary | ICD-10-CM | POA: Diagnosis not present

## 2014-03-29 DIAGNOSIS — N401 Enlarged prostate with lower urinary tract symptoms: Secondary | ICD-10-CM | POA: Diagnosis not present

## 2014-03-29 DIAGNOSIS — Z8551 Personal history of malignant neoplasm of bladder: Secondary | ICD-10-CM | POA: Diagnosis not present

## 2014-06-25 DIAGNOSIS — E785 Hyperlipidemia, unspecified: Secondary | ICD-10-CM | POA: Diagnosis not present

## 2014-06-25 DIAGNOSIS — E119 Type 2 diabetes mellitus without complications: Secondary | ICD-10-CM | POA: Diagnosis not present

## 2014-06-25 DIAGNOSIS — I10 Essential (primary) hypertension: Secondary | ICD-10-CM | POA: Diagnosis not present

## 2014-06-25 DIAGNOSIS — Z6821 Body mass index (BMI) 21.0-21.9, adult: Secondary | ICD-10-CM | POA: Diagnosis not present

## 2014-07-15 DIAGNOSIS — E119 Type 2 diabetes mellitus without complications: Secondary | ICD-10-CM | POA: Diagnosis not present

## 2014-07-15 DIAGNOSIS — Z961 Presence of intraocular lens: Secondary | ICD-10-CM | POA: Diagnosis not present

## 2014-07-28 ENCOUNTER — Other Ambulatory Visit: Payer: Self-pay | Admitting: Dermatology

## 2014-07-28 DIAGNOSIS — Z85828 Personal history of other malignant neoplasm of skin: Secondary | ICD-10-CM | POA: Diagnosis not present

## 2014-07-28 DIAGNOSIS — L859 Epidermal thickening, unspecified: Secondary | ICD-10-CM | POA: Diagnosis not present

## 2014-07-28 DIAGNOSIS — L570 Actinic keratosis: Secondary | ICD-10-CM | POA: Diagnosis not present

## 2014-07-28 DIAGNOSIS — D485 Neoplasm of uncertain behavior of skin: Secondary | ICD-10-CM | POA: Diagnosis not present

## 2014-07-28 DIAGNOSIS — D225 Melanocytic nevi of trunk: Secondary | ICD-10-CM | POA: Diagnosis not present

## 2014-07-28 DIAGNOSIS — D2372 Other benign neoplasm of skin of left lower limb, including hip: Secondary | ICD-10-CM | POA: Diagnosis not present

## 2014-07-28 DIAGNOSIS — D1801 Hemangioma of skin and subcutaneous tissue: Secondary | ICD-10-CM | POA: Diagnosis not present

## 2014-07-28 DIAGNOSIS — D224 Melanocytic nevi of scalp and neck: Secondary | ICD-10-CM | POA: Diagnosis not present

## 2014-07-28 DIAGNOSIS — L821 Other seborrheic keratosis: Secondary | ICD-10-CM | POA: Diagnosis not present

## 2014-09-24 DIAGNOSIS — E119 Type 2 diabetes mellitus without complications: Secondary | ICD-10-CM | POA: Diagnosis not present

## 2014-09-24 DIAGNOSIS — Z1389 Encounter for screening for other disorder: Secondary | ICD-10-CM | POA: Diagnosis not present

## 2014-09-24 DIAGNOSIS — I739 Peripheral vascular disease, unspecified: Secondary | ICD-10-CM | POA: Diagnosis not present

## 2014-09-24 DIAGNOSIS — E785 Hyperlipidemia, unspecified: Secondary | ICD-10-CM | POA: Diagnosis not present

## 2014-09-24 DIAGNOSIS — I10 Essential (primary) hypertension: Secondary | ICD-10-CM | POA: Diagnosis not present

## 2014-09-24 DIAGNOSIS — Z6820 Body mass index (BMI) 20.0-20.9, adult: Secondary | ICD-10-CM | POA: Diagnosis not present

## 2014-10-26 ENCOUNTER — Other Ambulatory Visit: Payer: Self-pay | Admitting: Cardiology

## 2014-10-27 DIAGNOSIS — N138 Other obstructive and reflux uropathy: Secondary | ICD-10-CM | POA: Diagnosis not present

## 2014-10-27 DIAGNOSIS — N401 Enlarged prostate with lower urinary tract symptoms: Secondary | ICD-10-CM | POA: Diagnosis not present

## 2014-10-27 DIAGNOSIS — R3912 Poor urinary stream: Secondary | ICD-10-CM | POA: Diagnosis not present

## 2014-12-07 DIAGNOSIS — M5137 Other intervertebral disc degeneration, lumbosacral region: Secondary | ICD-10-CM | POA: Diagnosis not present

## 2014-12-07 DIAGNOSIS — N200 Calculus of kidney: Secondary | ICD-10-CM | POA: Diagnosis not present

## 2014-12-07 DIAGNOSIS — K5730 Diverticulosis of large intestine without perforation or abscess without bleeding: Secondary | ICD-10-CM | POA: Diagnosis not present

## 2014-12-07 DIAGNOSIS — R109 Unspecified abdominal pain: Secondary | ICD-10-CM | POA: Diagnosis not present

## 2014-12-27 DIAGNOSIS — E785 Hyperlipidemia, unspecified: Secondary | ICD-10-CM | POA: Diagnosis not present

## 2014-12-27 DIAGNOSIS — I2510 Atherosclerotic heart disease of native coronary artery without angina pectoris: Secondary | ICD-10-CM | POA: Diagnosis not present

## 2014-12-27 DIAGNOSIS — I10 Essential (primary) hypertension: Secondary | ICD-10-CM | POA: Diagnosis not present

## 2014-12-27 DIAGNOSIS — Z6820 Body mass index (BMI) 20.0-20.9, adult: Secondary | ICD-10-CM | POA: Diagnosis not present

## 2014-12-27 DIAGNOSIS — E119 Type 2 diabetes mellitus without complications: Secondary | ICD-10-CM | POA: Diagnosis not present

## 2014-12-31 ENCOUNTER — Encounter: Payer: Self-pay | Admitting: Cardiology

## 2014-12-31 ENCOUNTER — Encounter: Payer: Self-pay | Admitting: *Deleted

## 2014-12-31 ENCOUNTER — Ambulatory Visit (INDEPENDENT_AMBULATORY_CARE_PROVIDER_SITE_OTHER): Payer: Medicare Other | Admitting: Cardiology

## 2014-12-31 VITALS — BP 134/72 | HR 52 | Ht 74.0 in | Wt 158.8 lb

## 2014-12-31 DIAGNOSIS — I739 Peripheral vascular disease, unspecified: Secondary | ICD-10-CM

## 2014-12-31 DIAGNOSIS — E785 Hyperlipidemia, unspecified: Secondary | ICD-10-CM

## 2014-12-31 DIAGNOSIS — G458 Other transient cerebral ischemic attacks and related syndromes: Secondary | ICD-10-CM

## 2014-12-31 LAB — LIPID PANEL
Cholesterol: 110 mg/dL — ABNORMAL LOW (ref 125–200)
HDL: 41 mg/dL (ref >=40)
LDL Cholesterol: 39 mg/dL (ref <130)
Total CHOL/HDL Ratio: 2.7 Ratio (ref <=5.0)
Triglycerides: 152 mg/dL — ABNORMAL HIGH (ref <150)
VLDL: 30 mg/dL (ref <30)

## 2014-12-31 LAB — BASIC METABOLIC PANEL
BUN: 14 mg/dL (ref 7–25)
CO2: 27 mmol/L (ref 20–31)
CREATININE: 0.85 mg/dL (ref 0.70–1.18)
Calcium: 8.9 mg/dL (ref 8.6–10.3)
Chloride: 101 mmol/L (ref 98–110)
GLUCOSE: 106 mg/dL — AB (ref 65–99)
Potassium: 3.9 mmol/L (ref 3.5–5.3)
Sodium: 139 mmol/L (ref 135–146)

## 2014-12-31 LAB — CBC WITH DIFFERENTIAL/PLATELET
BASOS PCT: 0 % (ref 0–1)
Basophils Absolute: 0 10*3/uL (ref 0.0–0.1)
EOS ABS: 0.2 10*3/uL (ref 0.0–0.7)
Eosinophils Relative: 3 % (ref 0–5)
HCT: 41.1 % (ref 39.0–52.0)
Hemoglobin: 14.1 g/dL (ref 13.0–17.0)
Lymphocytes Relative: 24 % (ref 12–46)
Lymphs Abs: 1.9 10*3/uL (ref 0.7–4.0)
MCH: 34.1 pg — AB (ref 26.0–34.0)
MCHC: 34.3 g/dL (ref 30.0–36.0)
MCV: 99.5 fL (ref 78.0–100.0)
MONOS PCT: 9 % (ref 3–12)
MPV: 9.8 fL (ref 8.6–12.4)
Monocytes Absolute: 0.7 10*3/uL (ref 0.1–1.0)
Neutro Abs: 5 10*3/uL (ref 1.7–7.7)
Neutrophils Relative %: 64 % (ref 43–77)
PLATELETS: 271 10*3/uL (ref 150–400)
RBC: 4.13 MIL/uL — AB (ref 4.22–5.81)
RDW: 13.3 % (ref 11.5–15.5)
WBC: 7.8 10*3/uL (ref 4.0–10.5)

## 2014-12-31 NOTE — Patient Instructions (Signed)
Medication Instructions:  No medication changes today.  Labwork: BMET/CBCd/Lipid profile.  Testing/Procedures: Your physician has requested that you have a lower  extremity arterial duplex. This test is an ultrasound of the arteries in the legs . It looks at arterial blood flow in the legs. Allow one hour for Lower Arterial scans. There are no restrictions or special instructions   Follow-Up: Your physician wants you to follow-up in: 6 months with Dr Aundra Dubin. (March 2017).  You will receive a reminder letter in the mail two months in advance. If you don't receive a letter, please call our office to schedule the follow-up appointment.  Marland Kitchen

## 2015-01-02 DIAGNOSIS — I739 Peripheral vascular disease, unspecified: Secondary | ICD-10-CM | POA: Insufficient documentation

## 2015-01-02 NOTE — [place] Notes (Signed)
Patient ID: [name], male   DOB: 1940/07/06, 74 y.o.   MRN: [id number] PCP: Dr. Jannette Fogo  74 yo with history of subclavian steal syndrome and diabetes presents for cardiology followup.  He had a cath in 2002 with minimal disease.  He has left subclavian stenosis with evidence for steal by doppler imaging, last dopplers done in 9/15.  He does not have symptoms of arm claudication or cerebral ischemia.  No chest pain, no exertional dyspnea.  Main complaint currently is left calf pain after walking about 100 yards and resolving with rest.  He says it began rather suddenly in 5/16 when he was mowing his grass.  He is unable to do his 2.5 mile walk anymore because of the pain.  He finds it very bothersome.  He is still smoking a pipe.    Labs (6/13): K 4.7, creatinine 0.8, hgbA1c 6.3, LDL 41, HDL 46 Labs (6/14): K 4.4, creatinine 0.91, LDL 50, HDL 41 Labs (6/15): K 4.3, creatinine 0.9, LFTs normal, HDL 38, LDL 46  ECG: NSR, anteroseptal Qs, iRBBB  PMH: 1. DM 2. HTN 3. CAD: Mild on cath in 2002.  Normal myoview in 2010.  4. Hyperlipidemia 5. Subclavian stenosis on left with evidence for steal, managed medically. 8/13 carotid dopplers with normal bilateral ICAs but left subclavian stenosis with evidence for subclavian steal. 9/15 carotid dopplers with 1-39% BICA stenosis, left subclavian stenosis with flow reversal in the left vertebral. 6. Mild bradycardia 7. TURP 1/13 8. Bladder cancer 9. Severe diverticulitis ultimately requiring partial colectomy with resection of colovesical fistula in 10/14.   SH: Retired, lives in [place].  Married. Smokes pipe 3-4 times a day.   FH: No premature CAD.   ROS: All systems reviewed and negative except as per HPI.    Current Outpatient Prescriptions  Medication Sig Dispense Refill  . aspirin 81 MG tablet Take 81 mg by mouth daily.    Marland Kitchen BIOTIN PO Take 1 tablet by mouth daily.     . Cyanocobalamin (VITAMIN B 12 PO) Take by mouth as directed.    .  dicyclomine (BENTYL) 20 MG tablet Take 20 mg by mouth every 6 (six) hours.    . fish oil-omega-3 fatty acids 1000 MG capsule Take 1 g by mouth daily.     Marland Kitchen glipiZIDE (GLUCOTROL) 5 MG tablet Take 2.5 mg by mouth daily before breakfast.     . metoprolol (LOPRESSOR) 50 MG tablet Take 1 tablet by mouth two  times daily 180 tablet 1  . Multiple Vitamin (MULTIVITAMIN) tablet Take 1 tablet by mouth daily.    . simvastatin (ZOCOR) 40 MG tablet Take 1 tablet by mouth  every evening 90 tablet 3  . tamsulosin (FLOMAX) 0.4 MG CAPS capsule Take 0.4 mg by mouth daily.     No current facility-administered medications for this visit.    BP 134/72 mmHg  Pulse 52  Ht '6\' 2"'$  (1.88 m)  Wt 158 lb 12.8 oz (72.031 kg)  BMI 20.38 kg/m2 General: NAD Neck: No JVD, no thyromegaly or thyroid nodule.  Lungs: Clear to auscultation bilaterally with normal respiratory effort. CV: Nondisplaced PMI.  Heart regular S1/S2, no S3/S4, no murmur.  No peripheral edema.  Murmur across upper left precordium, left shoulder, left side of the neck.  Unable to palpate pedal pulses.  Left radial pulse palpable but weak, 2+ on right.  Abdomen: Soft, nontender, no hepatosplenomegaly, no distention.  Neurologic: Alert and oriented x 3.  Psych: Normal affect. Extremities: No  clubbing or cyanosis.   Assessment/Plan: 1. Left subclavian stenosis: With subclavian steal physiology by dopplers in 9/15.  No arm claudication, no stroke-like symptoms.  Continue to manage medically.  Will arrange for repeat doppler evaluation in 9/17. 2. Hyperlipidemia: Check lipids on simvastatin today.  Goal LDL < 70 with known vascular disease.  3. Smoking: Still smokes pipe.  I told him again he ought to quit.  4. Claudication: Left calf symptoms very suspicious for claudication.  I will have him get lower extremity arterial dopplers.  If there is significant PAD confirmed, will add cilostazol and consider PV referral.   Followup in 6 months.   Loralie Champagne 01/02/2015

## 2015-01-10 ENCOUNTER — Ambulatory Visit (HOSPITAL_COMMUNITY)
Admission: RE | Admit: 2015-01-10 | Discharge: 2015-01-10 | Disposition: A | Discharge status: Home / Self Care | Payer: Medicare Other | Source: Ambulatory Visit | Attending: Cardiology | Admitting: Cardiology

## 2015-01-10 ENCOUNTER — Other Ambulatory Visit: Payer: Self-pay | Admitting: Cardiology

## 2015-01-10 DIAGNOSIS — E785 Hyperlipidemia, unspecified: Secondary | ICD-10-CM | POA: Insufficient documentation

## 2015-01-10 DIAGNOSIS — I10 Essential (primary) hypertension: Secondary | ICD-10-CM | POA: Diagnosis not present

## 2015-01-10 DIAGNOSIS — I739 Peripheral vascular disease, unspecified: Secondary | ICD-10-CM | POA: Diagnosis not present

## 2015-01-10 DIAGNOSIS — F17200 Nicotine dependence, unspecified, uncomplicated: Secondary | ICD-10-CM | POA: Insufficient documentation

## 2015-01-10 DIAGNOSIS — I70201 Unspecified atherosclerosis of native arteries of extremities, right leg: Secondary | ICD-10-CM | POA: Insufficient documentation

## 2015-01-12 ENCOUNTER — Telehealth: Payer: Self-pay | Admitting: *Deleted

## 2015-01-12 NOTE — Telephone Encounter (Signed)
Notes Recorded by Lynann Bologna, RN on 01/12/2015 at 11:43 AM Pt is aware of results and MD's recommendations. A PV appointment was made for pt to see Dr. Fletcher Anon on 01/18/15 at 9:15 AM. Pt is aware he verbalized understanding.

## 2015-01-12 NOTE — Telephone Encounter (Signed)
LMTCB 9/28 (to notify pt of recent test results and schedule a referral to Dr. Fletcher Anon).

## 2015-01-18 ENCOUNTER — Encounter: Payer: Self-pay | Admitting: Cardiovascular Disease

## 2015-01-18 ENCOUNTER — Ambulatory Visit (INDEPENDENT_AMBULATORY_CARE_PROVIDER_SITE_OTHER): Payer: Medicare Other | Admitting: Cardiovascular Disease

## 2015-01-18 VITALS — BP 138/68 | HR 49 | Ht 74.0 in | Wt 161.2 lb

## 2015-01-18 DIAGNOSIS — I739 Peripheral vascular disease, unspecified: Secondary | ICD-10-CM | POA: Diagnosis not present

## 2015-01-18 MED ORDER — CILOSTAZOL 50 MG PO TABS: 50.0000 mg | ORAL_TABLET | Freq: Two times a day (BID) | ORAL | Status: DC

## 2015-01-18 MED ORDER — CILOSTAZOL 50 MG PO TABS
50.0000 mg | ORAL_TABLET | Freq: Two times a day (BID) | ORAL | Status: DC
Start: 2015-01-18 — End: 2015-07-18

## 2015-01-18 NOTE — Progress Notes (Signed)
Patient ID: [name], male   DOB: 10/28/1940, 74 y.o.   MRN: [id number] Primary cardiologist: Dr. Aundra Dubin   74 yo with history of subclavian steal syndrome, PAD and diabetes who was referred by Dr. Aundra Dubin for evaluation of peripheral arterial disease.  He had a cath in 2002 with minimal disease.  He has left subclavian stenosis with evidence for steal by doppler imaging, last dopplers done in 9/15.  He does not have symptoms of arm claudication or cerebral ischemia.  No chest pain, no exertional dyspnea.  The patient reports left calf pain after walking half a block or less. The pain is usually severe and forces him to stop for few minutes before he can resume again. This has significantly limited his physical activities. He has no rest pain.  He smokes 2-3 pipes per day and has been doing so all his life. He used to walk 2-3 miles daily but had to cut down because of claudication. Noninvasive vascular evaluation showed an ABI of 0.91 on the right and 0.63 on the left with possible distal left SFA occlusion and three-vessel runoff below the knee.  PMH: 1. DM 2. HTN 3. CAD: Mild on cath in 2002.  Normal myoview in 2010.  4. Hyperlipidemia 5. Subclavian stenosis on left with evidence for steal, managed medically. 8/13 carotid dopplers with normal bilateral ICAs but left subclavian stenosis with evidence for subclavian steal. 9/15 carotid dopplers with 1-39% BICA stenosis, left subclavian stenosis with flow reversal in the left vertebral. 6. Mild bradycardia 7. TURP 1/13 8. Bladder cancer 9. Severe diverticulitis ultimately requiring partial colectomy with resection of colovesical fistula in 10/14.   SH: Retired, lives in [place].  Married. Smokes pipe 3-4 times a day.   FH: No premature CAD.   ROS: All systems reviewed and negative except as per HPI.    Current Outpatient Prescriptions  Medication Sig Dispense Refill  . aspirin 81 MG tablet Take 81 mg by mouth daily.    Marland Kitchen BIOTIN PO  Take 1,000 mg by mouth daily.     . Cyanocobalamin (VITAMIN B 12 PO) Take by mouth as directed.    . dicyclomine (BENTYL) 20 MG tablet Take 20 mg by mouth every 6 (six) hours as needed (spasms).     . fish oil-omega-3 fatty acids 1000 MG capsule Take 1 g by mouth daily.     Marland Kitchen glipiZIDE (GLUCOTROL) 5 MG tablet Take 2.5 mg by mouth daily before breakfast.     . metoprolol (LOPRESSOR) 50 MG tablet Take 1 tablet by mouth two  times daily 180 tablet 1  . Multiple Vitamin (MULTIVITAMIN) tablet Take 1 tablet by mouth daily.    . simvastatin (ZOCOR) 40 MG tablet Take 1 tablet by mouth  every evening 90 tablet 3  . tamsulosin (FLOMAX) 0.4 MG CAPS capsule Take 0.4 mg by mouth daily.     No current facility-administered medications for this visit.    BP 138/68 mmHg  Pulse 49  Ht '6\' 2"'$  (1.88 m)  Wt 161 lb 3.2 oz (73.12 kg)  BMI 20.69 kg/m2 General: NAD Neck: No JVD, no thyromegaly or thyroid nodule. Left carotid bruit  Lungs: Clear to auscultation bilaterally with normal respiratory effort. CV: Nondisplaced PMI.  Heart regular S1/S2, no S3/S4, no murmur.  No peripheral edema.  Murmur across upper left precordium, left shoulder, left side of the neck.  Unable to palpate pedal pulses.  Left radial pulse palpable but weak, 2+ on right.  Abdomen: Soft, nontender,  no hepatosplenomegaly, no distention.  Neurologic: Alert and oriented x 3.  Psych: Normal affect. Extremities:  Femoral pulses mildly diminished bilaterally. Posterior tibial is palpable on the right side only. Dorsalis pedis is not palpable bilaterally.  Assessment/Plan: 1.  peripheral arterial disease with lifestyle limiting left calf claudication due to SFA disease. The claudication has significantly limited his physical activities and lifestyle. Thus, I suggested proceeding with angiography and possible endovascular intervention. However, the patient is very hesitant and does not want to proceed. He reported that his friend died 3 days  after he had a carotid stent and thus the patient is concerned about any procedures. I elected to start him on cilostazol and instructed him to start a walking program.  2. Left subclavian stenosis: With subclavian steal physiology by dopplers in 9/15.  No arm claudication, no stroke-like symptoms.  Continue to manage medically.  3. Hyperlipidemia: Check lipids on simvastatin today.  Goal LDL < 70 with known vascular disease.  3. Smoking:  I had a prolonged discussion with him about the importance of smoking cessation.   Follow-up with me in 3 months to reevaluate symptoms and consider angiography if the patient is agreeable.   Kathlyn Sacramento 01/18/2015

## 2015-01-18 NOTE — Patient Instructions (Signed)
Your physician has recommended you make the following change in your medication:  1.) start Pletal 50 mg twice daily  Your physician wants you to follow-up in: 3 months with Dr. Fletcher Anon.  You will receive a reminder letter in the mail two months in advance. If you don't receive a letter, please call our office to schedule the follow-up appointment.

## 2015-01-21 DIAGNOSIS — Z23 Encounter for immunization: Secondary | ICD-10-CM | POA: Diagnosis not present

## 2015-02-11 ENCOUNTER — Telehealth: Payer: Self-pay | Admitting: Cardiovascular Disease

## 2015-02-11 NOTE — Telephone Encounter (Signed)
Stop Pletal. There is no other similar medication. If his symptoms do not improve, we should think about proceeding with angiography.

## 2015-02-11 NOTE — Telephone Encounter (Signed)
New Message  Pt c/o of stomach issues from taking 'Pletal' pt stated he experiences stomach pain 30-40 minutes after taking pill. Please call back and discuss.

## 2015-02-11 NOTE — Telephone Encounter (Signed)
Spoke with pt and advised him of information provided by Dr. Fletcher Anon. Advised pt to let our office is symptoms did not improve. Pt states that the calf pain he had before had improved on Pletal but he just cant tolerate the abdominal pain. Advised pt to call our office if calf pain returned. Pt verbalized understanding and was in agreement with this plan.

## 2015-02-11 NOTE — Telephone Encounter (Signed)
Pt started on Pletal on 10/4. Pt states that about a week after starting Pletal he began having abdominal pain that felt similar to "hunger pains". The pain would occur about 30 minutes after taking the medication and would never truly resolve. Pt states that he did not take the medication this morning and the abdominal pain has improved. Advised pt that I would route this information to Dr. Fletcher Anon for review and advisement. Pt verbalized understanding and asked that any new medication prescribed be sent to [facility] in [place].

## 2015-02-21 DIAGNOSIS — N401 Enlarged prostate with lower urinary tract symptoms: Secondary | ICD-10-CM | POA: Diagnosis not present

## 2015-02-21 DIAGNOSIS — R3912 Poor urinary stream: Secondary | ICD-10-CM | POA: Diagnosis not present

## 2015-02-21 DIAGNOSIS — Z8551 Personal history of malignant neoplasm of bladder: Secondary | ICD-10-CM | POA: Diagnosis not present

## 2015-02-22 IMAGING — CT CT IMAGE GUIDED FLUID DRAIN BY CATHETER
1 of 4 series · 12 of 32 positions shown, 18 images · non-contrast
Comparison: CT abdomen pelvis - 11/10/2012

INDICATION: Perforated diverticulitis, now with abscess within the
left lower abdomen.

CT GUIDED LEFT LOWER ABDOMINAL DRAINAGE CATHETER PLACEMENT

[Series 2: i-spiral 5.0 b30f · axial · 0.69mm/px · z∈[-343,-161]mm · 12 of 62 slices shown, 18 images]
[im 5/62  soft-tissue]
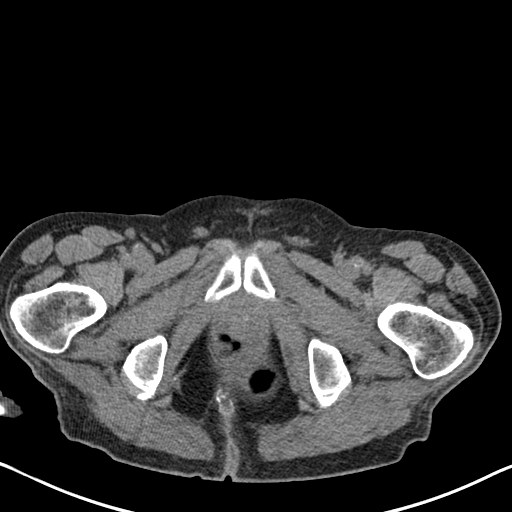
[im 5/62  bone]
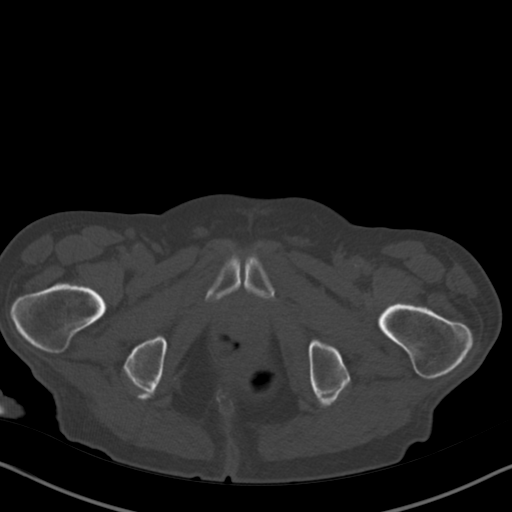
[im 10/62  soft-tissue]
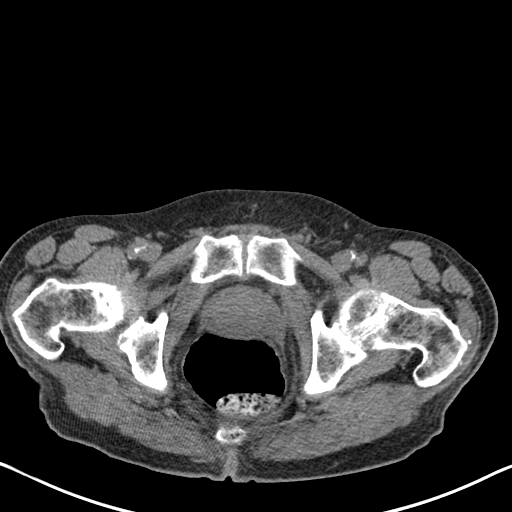
[im 15/62  soft-tissue]
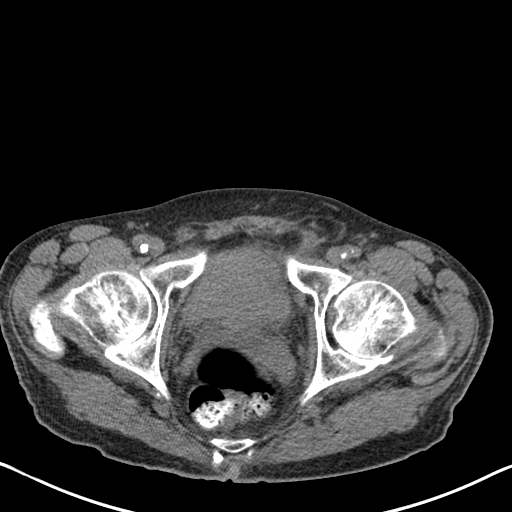
[im 19/62  soft-tissue]
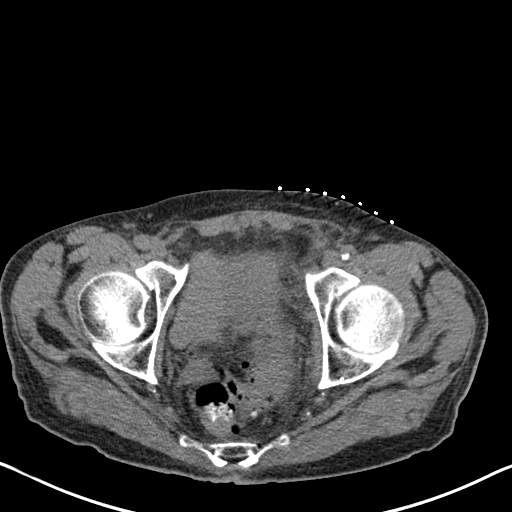
[im 24/62  soft-tissue]
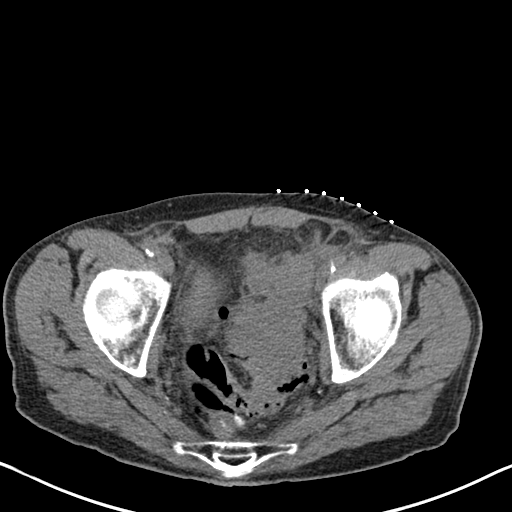
[im 29/62  soft-tissue]
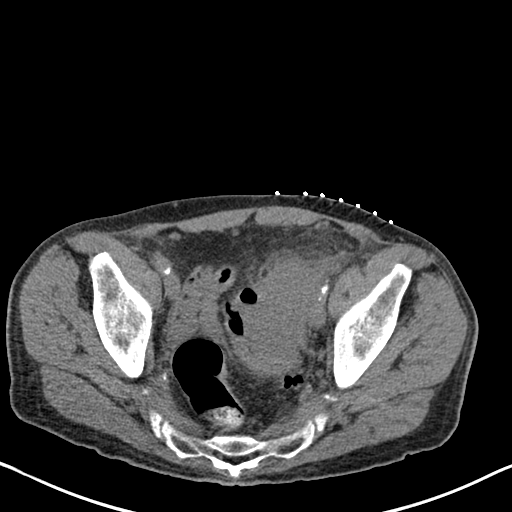
[im 33/62  soft-tissue]
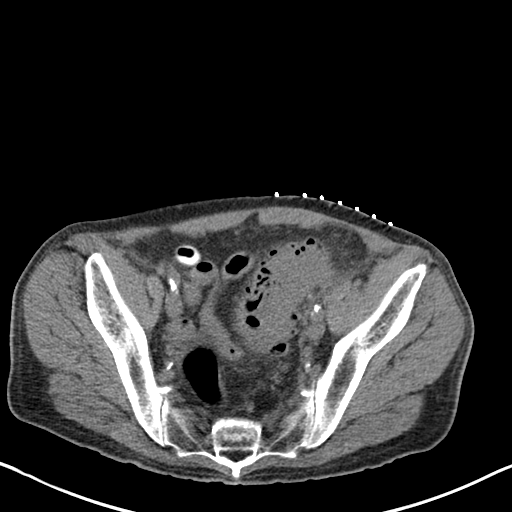
[im 38/62  soft-tissue]
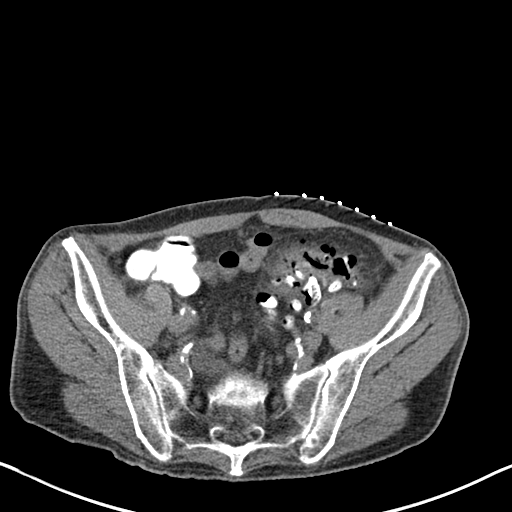
[im 43/62  soft-tissue]
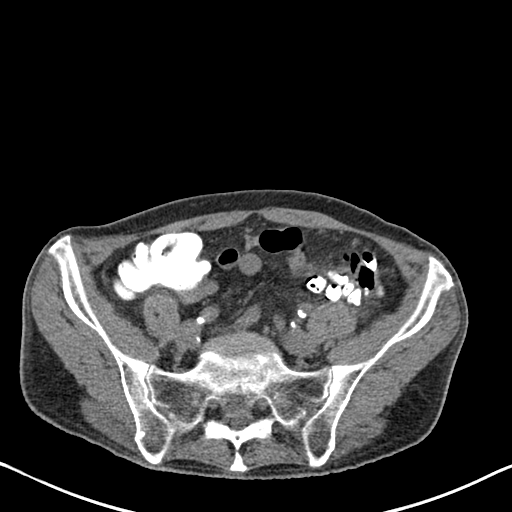
[im 43/62  lung]
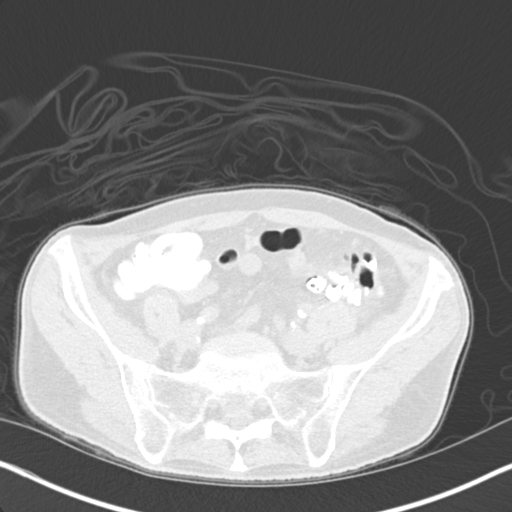
[im 43/62  bone]
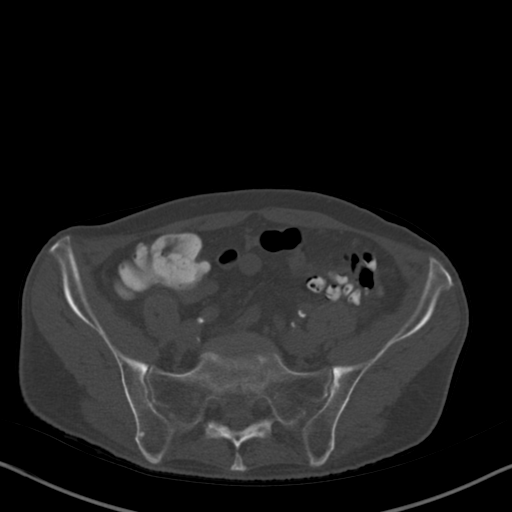
[im 47/62  soft-tissue]
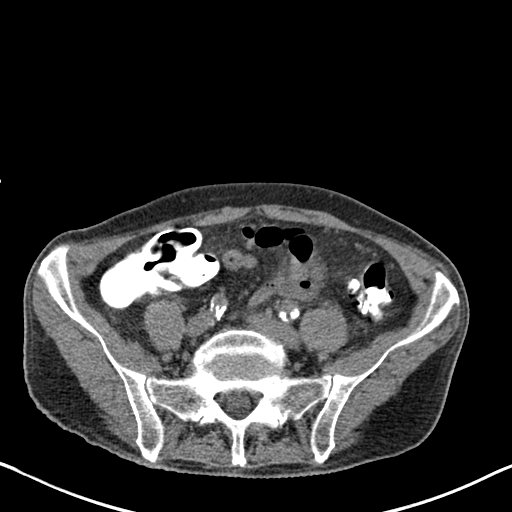
[im 47/62  lung]
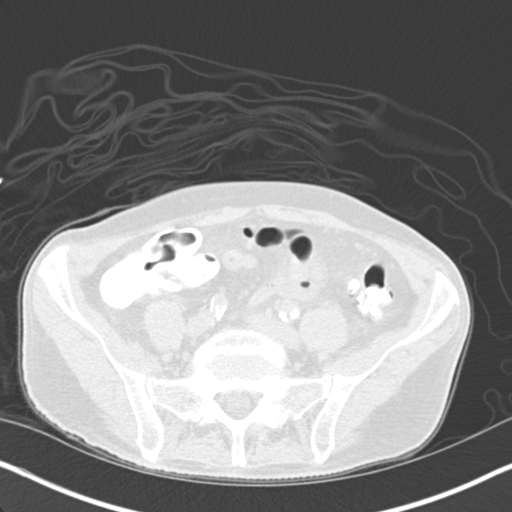
[im 52/62  soft-tissue]
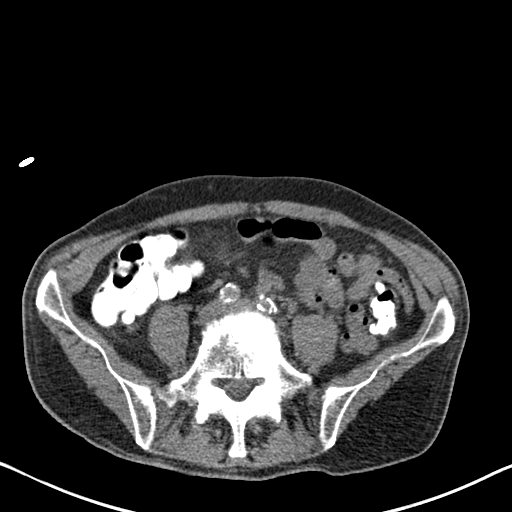
[im 52/62  lung]
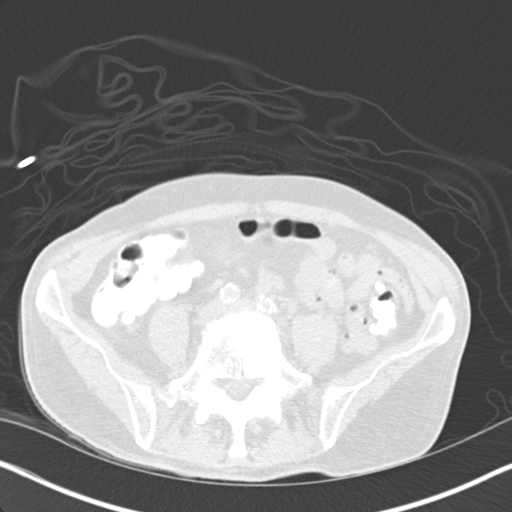
[im 57/62  soft-tissue]
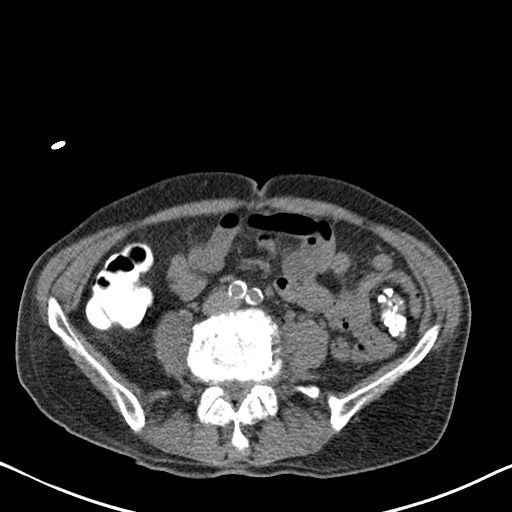
[im 57/62  lung]
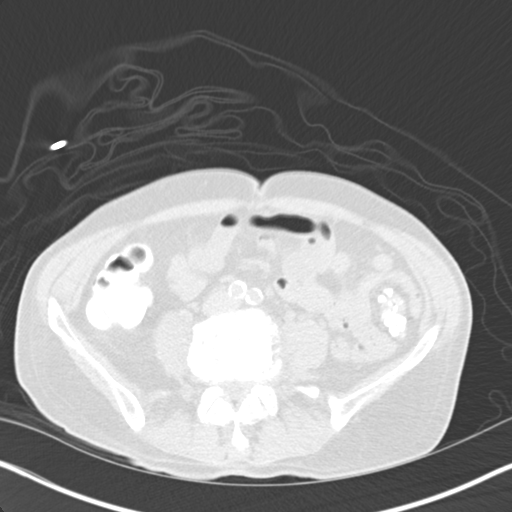

[12 of 32 positions shown; findings below may reference images not displayed]

Medications: Fentanyl 50 mcg IV; Versed 2 mg IV

Total Moderate Sedation time: 20 minutes

Contrast: None

Complications: None immediate

Technique / Findings:

Informed written consent was obtained from the patient after a
discussion of the risks, benefits and alternatives to treatment.
The patient was placed supine on the CT gantry and a pre procedural
CT was performed re-demonstrating the known abscess/fluid
collection within the left lower abdominal quadrant.  The procedure
was planned.   A timeout was performed prior to the initiation of
the procedure.

The left lower abdomen was prepped and draped in the usual sterile
fashion.   The overlying soft tissues were anesthetized with 1%
lidocaine with epinephrine.  Appropriate trajectory was planned
with the use of a 22 gauge spinal needle.  An 18 gauge trocar
needle was advanced into the abscess/fluid collection and a short
Amplatz super stiff wire was coiled within the collection.
Appropriate positioning was confirmed with a limited CT scan.  The
tract was serially dilated allowing placement of a 10 French all-
purpose drainage catheter.  Appropriate positioning was confirmed
with a limited postprocedural CT scan.

40 ml of purulent fluid was aspirated.  The tube was connected to a
drainage bag and sutured in place.  A dressing was placed.  The
patient tolerated the procedure well without immediate post
procedural complication.
IMPRESSION: Successful CT guided placement of a 10 French all purpose drain
catheter into the location with aspiration of 40 mL of purulent
fluid.  Samples were sent to the laboratory as requested by the
ordering clinical team.

## 2015-03-13 DIAGNOSIS — J209 Acute bronchitis, unspecified: Secondary | ICD-10-CM | POA: Diagnosis not present

## 2015-03-28 DIAGNOSIS — E119 Type 2 diabetes mellitus without complications: Secondary | ICD-10-CM | POA: Diagnosis not present

## 2015-03-28 DIAGNOSIS — E785 Hyperlipidemia, unspecified: Secondary | ICD-10-CM | POA: Diagnosis not present

## 2015-03-28 DIAGNOSIS — Z6820 Body mass index (BMI) 20.0-20.9, adult: Secondary | ICD-10-CM | POA: Diagnosis not present

## 2015-03-28 DIAGNOSIS — Z9181 History of falling: Secondary | ICD-10-CM | POA: Diagnosis not present

## 2015-03-28 DIAGNOSIS — I10 Essential (primary) hypertension: Secondary | ICD-10-CM | POA: Diagnosis not present

## 2015-05-05 ENCOUNTER — Telehealth: Payer: Self-pay | Admitting: Cardiology

## 2015-05-05 ENCOUNTER — Other Ambulatory Visit: Payer: Self-pay | Admitting: *Deleted

## 2015-05-05 MED ORDER — METOPROLOL TARTRATE 50 MG PO TABS: ORAL_TABLET | ORAL | Status: DC

## 2015-05-05 NOTE — Telephone Encounter (Signed)
New message      *STAT* If patient is at the pharmacy, call can be transferred to refill team.   1. Which medications need to be refilled? (please list name of each medication and dose if known) metoprolol  50 mg   2. Which pharmacy/location (including street and city if local pharmacy) is medication to be sent to? [facility]    3. Do they need a 30 day or 90 day supply? 90 days

## 2015-06-28 DIAGNOSIS — I2510 Atherosclerotic heart disease of native coronary artery without angina pectoris: Secondary | ICD-10-CM | POA: Diagnosis not present

## 2015-06-28 DIAGNOSIS — Z0000 Encounter for general adult medical examination without abnormal findings: Secondary | ICD-10-CM | POA: Diagnosis not present

## 2015-06-28 DIAGNOSIS — Z7189 Other specified counseling: Secondary | ICD-10-CM | POA: Diagnosis not present

## 2015-06-28 DIAGNOSIS — E785 Hyperlipidemia, unspecified: Secondary | ICD-10-CM | POA: Diagnosis not present

## 2015-06-28 DIAGNOSIS — I10 Essential (primary) hypertension: Secondary | ICD-10-CM | POA: Diagnosis not present

## 2015-06-28 DIAGNOSIS — Z6820 Body mass index (BMI) 20.0-20.9, adult: Secondary | ICD-10-CM | POA: Diagnosis not present

## 2015-06-28 DIAGNOSIS — R972 Elevated prostate specific antigen [PSA]: Secondary | ICD-10-CM | POA: Diagnosis not present

## 2015-06-28 DIAGNOSIS — E119 Type 2 diabetes mellitus without complications: Secondary | ICD-10-CM | POA: Diagnosis not present

## 2015-06-28 DIAGNOSIS — Z139 Encounter for screening, unspecified: Secondary | ICD-10-CM | POA: Diagnosis not present

## 2015-06-28 DIAGNOSIS — G4700 Insomnia, unspecified: Secondary | ICD-10-CM | POA: Diagnosis not present

## 2015-07-18 ENCOUNTER — Ambulatory Visit (INDEPENDENT_AMBULATORY_CARE_PROVIDER_SITE_OTHER): Payer: Medicare Other | Admitting: Cardiology

## 2015-07-18 ENCOUNTER — Encounter: Payer: Self-pay | Admitting: Cardiology

## 2015-07-18 VITALS — BP 126/58 | HR 67 | Ht 74.0 in | Wt 161.0 lb

## 2015-07-18 DIAGNOSIS — I739 Peripheral vascular disease, unspecified: Secondary | ICD-10-CM

## 2015-07-18 DIAGNOSIS — R1030 Lower abdominal pain, unspecified: Secondary | ICD-10-CM | POA: Diagnosis not present

## 2015-07-18 DIAGNOSIS — K4090 Unilateral inguinal hernia, without obstruction or gangrene, not specified as recurrent: Secondary | ICD-10-CM | POA: Insufficient documentation

## 2015-07-18 DIAGNOSIS — E785 Hyperlipidemia, unspecified: Secondary | ICD-10-CM

## 2015-07-18 DIAGNOSIS — G458 Other transient cerebral ischemic attacks and related syndromes: Secondary | ICD-10-CM | POA: Diagnosis not present

## 2015-07-18 MED ORDER — SIMVASTATIN 40 MG PO TABS: ORAL_TABLET | ORAL | Status: DC

## 2015-07-18 MED ORDER — METOPROLOL TARTRATE 50 MG PO TABS: ORAL_TABLET | ORAL | Status: DC

## 2015-07-18 NOTE — Progress Notes (Addendum)
Patient ID: [name], male   DOB: 01-22-1941, 75 y.o.   MRN: [id number] PCP: Dr. Jannette Fogo  75 yo with history of subclavian steal syndrome, PAD, and diabetes presents for cardiology followup.  He had a cath in 2002 with minimal disease.  He has left subclavian stenosis with evidence for steal by doppler imaging, last dopplers done in 9/15.  He does not have symptoms of arm claudication or cerebral ischemia.  Peripheral arterial dopplers in 9/16 showed bilateral L>R SFA disease.  He has had significant claudication.  He was seen by Dr Fletcher Anon who recommended angiography, but he did not want to have the procedure done.  He was started on cilostazol but this caused abdominal pain.    No chest pain, no exertional dyspnea.  He has been walking 2.5 miles daily. He has been walking through his left calf pain.  The pain has been getting better with steady exercise.  He is still smoking a pipe.    He needs surgery on an inguinal hernia.   Labs (6/13): K 4.7, creatinine 0.8, hgbA1c 6.3, LDL 41, HDL 46 Labs (6/14): K 4.4, creatinine 0.91, LDL 50, HDL 41 Labs (6/15): K 4.3, creatinine 0.9, LFTs normal, HDL 38, LDL 46 Labs (9/16): K 3.9, creatinine 0.85, HCT 41.1, LDL 39, HDL 41 Labs (3/17): LDL 44, HDL 48, TGs 86  ECG: NSR, anteroseptal Qs, iRBBB  PMH: 1. DM 2. HTN 3. CAD: Mild on cath in 2002.  Normal myoview in 2010.  4. Hyperlipidemia 5. Subclavian stenosis on left with evidence for steal, managed medically. 8/13 carotid dopplers with normal bilateral ICAs but left subclavian stenosis with evidence for subclavian steal. 9/15 carotid dopplers with 1-39% BICA stenosis, left subclavian stenosis with flow reversal in the left vertebral. 6. Mild bradycardia 7. TURP 1/13 8. Bladder cancer 9. Severe diverticulitis ultimately requiring partial colectomy with resection of colovesical fistula in 10/14.  10. PAD: Lower extremity dopplers (9/16) with bilateral SFA disease, L>R; ABI 0.63 left/0.91 right.   Abdominal pain with cilostazol, unable to use.   SH: Retired, lives in [place].  Married. Smokes pipe 3-4 times a day.   FH: No premature CAD.   ROS: All systems reviewed and negative except as per HPI.    Current Outpatient Prescriptions  Medication Sig Dispense Refill  . aspirin 81 MG tablet Take 81 mg by mouth daily.    . Cyanocobalamin (VITAMIN B 12 PO) Take by mouth as directed.    . dicyclomine (BENTYL) 20 MG tablet Take 20 mg by mouth every 6 (six) hours as needed (spasms).     . fish oil-omega-3 fatty acids 1000 MG capsule Take 1 g by mouth daily.     Marland Kitchen glipiZIDE (GLUCOTROL) 5 MG tablet Take 2.5 mg by mouth daily before breakfast.     . metoprolol (LOPRESSOR) 50 MG tablet Take 1 tablet by mouth two times daily 180 tablet 3  . Multiple Vitamin (MULTIVITAMIN) tablet Take 1 tablet by mouth daily.    . simvastatin (ZOCOR) 40 MG tablet Take 1 tablet by mouth  every evening 90 tablet 3  . tamsulosin (FLOMAX) 0.4 MG CAPS capsule Take 0.4 mg by mouth daily.     No current facility-administered medications for this visit.    BP 126/58 mmHg  Pulse 67  Ht '6\' 2"'$  (1.88 m)  Wt 161 lb (73.029 kg)  BMI 20.66 kg/m2 General: NAD Neck: No JVD, no thyromegaly or thyroid nodule.  Lungs: Clear to auscultation bilaterally with normal respiratory  effort. CV: Nondisplaced PMI.  Heart regular S1/S2, no S3/S4, no murmur.  No peripheral edema.  Murmur across upper left precordium, left shoulder, left side of the neck.  Unable to palpate pedal pulses.  Left radial pulse palpable but weak, 2+ on right.  Abdomen: Soft, nontender, no hepatosplenomegaly, no distention.  Neurologic: Alert and oriented x 3.  Psych: Normal affect. Extremities: No clubbing or cyanosis.   Assessment/Plan: 1. Left subclavian stenosis: With subclavian steal physiology by dopplers in 9/15.  No arm claudication, no stroke-like symptoms.  Continue to manage medically.  Will arrange for repeat doppler evaluation in 9/17. 2.  Hyperlipidemia: Good lipids in 3/17.  3. Smoking: Still smokes pipe.  I told him again he ought to quit.  4. PAD: He saw Dr Fletcher Anon and decided that he did not want peripheral angiography.  He was unable to take cilostazol due to abdominal pain.  However, he has been walking more and walking through the pain.  Claudication has been improving.  Continue to try to walk through the pain. Repeat lower extremity dopplers in 9/17. Continue ASA and statin.  5. Preoperative evaluation: No chest pain, good exercise tolerance.  I think that he can undergo hernia surgery without further workup. He should continue metoprolol peri-operatively.  It would also be helpful to quit smoking.   Followup in 1 year.    Loralie Champagne 07/18/2015

## 2015-07-18 NOTE — Patient Instructions (Signed)
Medication Instructions:  Your physician recommends that you continue on your current medications as directed. Please refer to the Current Medication list given to you today.   Labwork: none  Testing/Procedures: Your physician has requested that you have a carotid duplex. This test is an ultrasound of the carotid arteries in your neck. It looks at blood flow through these arteries that supply the brain with blood. Allow one hour for this exam. There are no restrictions or special instructions. September 2017  Your physician has requested that you have a lower  extremity arterial duplex. This test is an ultrasound of the arteries in the legs. It looks at arterial blood flow in the legs. Allow one hour for Lower  Arterial scans. There are no restrictions or special instructions September 2017   Follow-Up: Your physician wants you to follow-up in: 1 year with Dr Aundra Dubin. (April 2018) You will receive a reminder letter in the mail two months in advance. If you don't receive a letter, please call our office to schedule the follow-up appointment.        If you need a refill on your cardiac medications before your next appointment, please call your pharmacy.

## 2015-07-19 NOTE — Addendum Note (Signed)
Addended by: Katrine Coho on: 07/19/2015 08:27 AM   Modules accepted: Orders

## 2015-07-27 DIAGNOSIS — I10 Essential (primary) hypertension: Secondary | ICD-10-CM | POA: Diagnosis not present

## 2015-07-27 DIAGNOSIS — Z79899 Other long term (current) drug therapy: Secondary | ICD-10-CM | POA: Diagnosis not present

## 2015-07-27 DIAGNOSIS — Z7984 Long term (current) use of oral hypoglycemic drugs: Secondary | ICD-10-CM | POA: Diagnosis not present

## 2015-07-27 DIAGNOSIS — K4090 Unilateral inguinal hernia, without obstruction or gangrene, not specified as recurrent: Secondary | ICD-10-CM | POA: Diagnosis not present

## 2015-07-27 DIAGNOSIS — Z7982 Long term (current) use of aspirin: Secondary | ICD-10-CM | POA: Diagnosis not present

## 2015-07-28 DIAGNOSIS — N401 Enlarged prostate with lower urinary tract symptoms: Secondary | ICD-10-CM | POA: Diagnosis not present

## 2015-07-28 DIAGNOSIS — R339 Retention of urine, unspecified: Secondary | ICD-10-CM | POA: Diagnosis not present

## 2015-08-01 DIAGNOSIS — R339 Retention of urine, unspecified: Secondary | ICD-10-CM | POA: Diagnosis not present

## 2015-08-01 DIAGNOSIS — N401 Enlarged prostate with lower urinary tract symptoms: Secondary | ICD-10-CM | POA: Diagnosis not present

## 2015-08-03 DIAGNOSIS — N138 Other obstructive and reflux uropathy: Secondary | ICD-10-CM | POA: Diagnosis not present

## 2015-08-03 DIAGNOSIS — N3000 Acute cystitis without hematuria: Secondary | ICD-10-CM | POA: Diagnosis not present

## 2015-08-03 DIAGNOSIS — Z0000 Encounter for general adult medical examination without abnormal findings: Secondary | ICD-10-CM | POA: Diagnosis not present

## 2015-08-03 DIAGNOSIS — N401 Enlarged prostate with lower urinary tract symptoms: Secondary | ICD-10-CM | POA: Diagnosis not present

## 2015-08-03 DIAGNOSIS — R509 Fever, unspecified: Secondary | ICD-10-CM | POA: Diagnosis not present

## 2015-08-11 DIAGNOSIS — E119 Type 2 diabetes mellitus without complications: Secondary | ICD-10-CM | POA: Diagnosis not present

## 2015-09-28 DIAGNOSIS — N3090 Cystitis, unspecified without hematuria: Secondary | ICD-10-CM | POA: Diagnosis not present

## 2015-09-28 DIAGNOSIS — N3001 Acute cystitis with hematuria: Secondary | ICD-10-CM | POA: Diagnosis not present

## 2015-09-29 DIAGNOSIS — E119 Type 2 diabetes mellitus without complications: Secondary | ICD-10-CM | POA: Diagnosis not present

## 2015-09-29 DIAGNOSIS — E785 Hyperlipidemia, unspecified: Secondary | ICD-10-CM | POA: Diagnosis not present

## 2015-09-29 DIAGNOSIS — Z681 Body mass index (BMI) 19 or less, adult: Secondary | ICD-10-CM | POA: Diagnosis not present

## 2015-09-29 DIAGNOSIS — G4700 Insomnia, unspecified: Secondary | ICD-10-CM | POA: Diagnosis not present

## 2015-09-29 DIAGNOSIS — I10 Essential (primary) hypertension: Secondary | ICD-10-CM | POA: Diagnosis not present

## 2015-10-04 DIAGNOSIS — D1801 Hemangioma of skin and subcutaneous tissue: Secondary | ICD-10-CM | POA: Diagnosis not present

## 2015-10-04 DIAGNOSIS — Z85828 Personal history of other malignant neoplasm of skin: Secondary | ICD-10-CM | POA: Diagnosis not present

## 2015-10-04 DIAGNOSIS — L821 Other seborrheic keratosis: Secondary | ICD-10-CM | POA: Diagnosis not present

## 2015-10-04 DIAGNOSIS — L570 Actinic keratosis: Secondary | ICD-10-CM | POA: Diagnosis not present

## 2015-12-20 ENCOUNTER — Other Ambulatory Visit: Payer: Self-pay | Admitting: Cardiology

## 2015-12-20 DIAGNOSIS — I739 Peripheral vascular disease, unspecified: Secondary | ICD-10-CM

## 2015-12-27 ENCOUNTER — Ambulatory Visit (HOSPITAL_COMMUNITY)
Admission: RE | Admit: 2015-12-27 | Discharge: 2015-12-27 | Disposition: A | Discharge status: Home / Self Care | Payer: Medicare Other | Source: Ambulatory Visit | Attending: Cardiovascular Disease | Admitting: Cardiovascular Disease

## 2015-12-27 DIAGNOSIS — I739 Peripheral vascular disease, unspecified: Secondary | ICD-10-CM | POA: Insufficient documentation

## 2015-12-27 DIAGNOSIS — E785 Hyperlipidemia, unspecified: Secondary | ICD-10-CM | POA: Insufficient documentation

## 2015-12-27 DIAGNOSIS — Z720 Tobacco use: Secondary | ICD-10-CM | POA: Diagnosis not present

## 2015-12-27 DIAGNOSIS — I2510 Atherosclerotic heart disease of native coronary artery without angina pectoris: Secondary | ICD-10-CM | POA: Insufficient documentation

## 2015-12-27 DIAGNOSIS — G458 Other transient cerebral ischemic attacks and related syndromes: Secondary | ICD-10-CM | POA: Insufficient documentation

## 2015-12-27 DIAGNOSIS — R938 Abnormal findings on diagnostic imaging of other specified body structures: Secondary | ICD-10-CM | POA: Insufficient documentation

## 2015-12-27 DIAGNOSIS — I10 Essential (primary) hypertension: Secondary | ICD-10-CM | POA: Diagnosis not present

## 2015-12-27 DIAGNOSIS — I6523 Occlusion and stenosis of bilateral carotid arteries: Secondary | ICD-10-CM | POA: Insufficient documentation

## 2015-12-29 DIAGNOSIS — Z6820 Body mass index (BMI) 20.0-20.9, adult: Secondary | ICD-10-CM | POA: Diagnosis not present

## 2015-12-29 DIAGNOSIS — E119 Type 2 diabetes mellitus without complications: Secondary | ICD-10-CM | POA: Diagnosis not present

## 2015-12-29 DIAGNOSIS — Z9181 History of falling: Secondary | ICD-10-CM | POA: Diagnosis not present

## 2015-12-29 DIAGNOSIS — Z1389 Encounter for screening for other disorder: Secondary | ICD-10-CM | POA: Diagnosis not present

## 2015-12-29 DIAGNOSIS — G4700 Insomnia, unspecified: Secondary | ICD-10-CM | POA: Diagnosis not present

## 2016-01-25 DIAGNOSIS — Z23 Encounter for immunization: Secondary | ICD-10-CM | POA: Diagnosis not present

## 2016-02-20 DIAGNOSIS — N401 Enlarged prostate with lower urinary tract symptoms: Secondary | ICD-10-CM | POA: Diagnosis not present

## 2016-02-27 ENCOUNTER — Other Ambulatory Visit: Payer: Self-pay | Admitting: Cardiology

## 2016-02-27 DIAGNOSIS — R3912 Poor urinary stream: Secondary | ICD-10-CM | POA: Diagnosis not present

## 2016-02-27 DIAGNOSIS — Z8551 Personal history of malignant neoplasm of bladder: Secondary | ICD-10-CM | POA: Diagnosis not present

## 2016-02-27 DIAGNOSIS — N401 Enlarged prostate with lower urinary tract symptoms: Secondary | ICD-10-CM | POA: Diagnosis not present

## 2016-03-29 DIAGNOSIS — E119 Type 2 diabetes mellitus without complications: Secondary | ICD-10-CM | POA: Diagnosis not present

## 2016-03-29 DIAGNOSIS — Z681 Body mass index (BMI) 19 or less, adult: Secondary | ICD-10-CM | POA: Diagnosis not present

## 2016-05-23 DIAGNOSIS — J189 Pneumonia, unspecified organism: Secondary | ICD-10-CM | POA: Diagnosis not present

## 2016-05-28 ENCOUNTER — Other Ambulatory Visit: Payer: Self-pay | Admitting: Cardiology

## 2016-05-29 NOTE — Telephone Encounter (Signed)
°*  STAT* If patient is at the pharmacy, call can be transferred to refill team.   1. Which medications need to be refilled? (please list name of each medication and dose if known) Simvastatin, 40 mg Metoprolol, 50 mg  2. Which pharmacy/location (including street and city if local pharmacy) is medication to be sent to? [place], [place]  3. Do they need a 30 day or 90 day supply? 90 day

## 2016-06-28 DIAGNOSIS — Z681 Body mass index (BMI) 19 or less, adult: Secondary | ICD-10-CM | POA: Diagnosis not present

## 2016-06-28 DIAGNOSIS — R972 Elevated prostate specific antigen [PSA]: Secondary | ICD-10-CM | POA: Diagnosis not present

## 2016-06-28 DIAGNOSIS — G4700 Insomnia, unspecified: Secondary | ICD-10-CM | POA: Diagnosis not present

## 2016-06-28 DIAGNOSIS — G2581 Restless legs syndrome: Secondary | ICD-10-CM | POA: Diagnosis not present

## 2016-06-28 DIAGNOSIS — E119 Type 2 diabetes mellitus without complications: Secondary | ICD-10-CM | POA: Diagnosis not present

## 2016-06-28 DIAGNOSIS — E785 Hyperlipidemia, unspecified: Secondary | ICD-10-CM | POA: Diagnosis not present

## 2016-06-28 DIAGNOSIS — I2510 Atherosclerotic heart disease of native coronary artery without angina pectoris: Secondary | ICD-10-CM | POA: Diagnosis not present

## 2016-06-28 DIAGNOSIS — I10 Essential (primary) hypertension: Secondary | ICD-10-CM | POA: Diagnosis not present

## 2016-07-16 ENCOUNTER — Encounter: Payer: Self-pay | Admitting: Cardiology

## 2016-07-31 ENCOUNTER — Ambulatory Visit (INDEPENDENT_AMBULATORY_CARE_PROVIDER_SITE_OTHER): Payer: Medicare Other | Admitting: Cardiology

## 2016-07-31 ENCOUNTER — Encounter: Payer: Self-pay | Admitting: Cardiology

## 2016-07-31 VITALS — BP 150/60 | HR 50 | Ht 74.0 in | Wt 158.4 lb

## 2016-07-31 DIAGNOSIS — E7800 Pure hypercholesterolemia, unspecified: Secondary | ICD-10-CM

## 2016-07-31 DIAGNOSIS — G458 Other transient cerebral ischemic attacks and related syndromes: Secondary | ICD-10-CM

## 2016-07-31 DIAGNOSIS — I2510 Atherosclerotic heart disease of native coronary artery without angina pectoris: Secondary | ICD-10-CM

## 2016-07-31 DIAGNOSIS — I10 Essential (primary) hypertension: Secondary | ICD-10-CM

## 2016-07-31 DIAGNOSIS — R001 Bradycardia, unspecified: Secondary | ICD-10-CM | POA: Diagnosis not present

## 2016-07-31 MED ORDER — SIMVASTATIN 40 MG PO TABS: 40.0000 mg | ORAL_TABLET | Freq: Every evening | ORAL | 3 refills | Status: DC

## 2016-07-31 MED ORDER — METOPROLOL TARTRATE 50 MG PO TABS: 50.0000 mg | ORAL_TABLET | Freq: Two times a day (BID) | ORAL | 3 refills | Status: DC

## 2016-07-31 NOTE — Progress Notes (Signed)
Cardiology Office Note    Date:  07/31/2016   ID:  DEVYON Dawson, DOB 10/12/1940, MRN [id number]  PCP:  Daphene Jaeger, PA-C  Cardiologist:  Fransico Him, MD   Chief Complaint  Patient presents with  . Subclavian steal syndrome    follow up  . Coronary Artery Disease  . Hypertension  . Hyperlipidemia    History of Present Illness:  [name] is a 76 y.o. male with a history of subclavian steal syndrome, PAD, DM, HTN, hyperlipidemia and diabetes who had been followed by Dr. Aundra Dubin who presents today for cardiology followup.  He had a cath in 2002 with minimal disease.  He has left subclavian stenosis with evidence for steal by doppler imaging, last dopplers done in 9/15.  He does not have symptoms of arm claudication or cerebral ischemia.  Peripheral arterial dopplers in 9/16 showed bilateral L>R SFA disease.  He has had significant claudication.  He was seen by Dr Fletcher Anon who recommended angiography, but he did not want to have the procedure done.  He was started on cilostazol but this caused abdominal pain.  He is doing well today.  He denies any chest pain, SOB, DOE, PND, orthopnea, LE edema, dizziness, palpitations or syncope.  He does have LLE claudication occasionally but thinks that it has gotten better with walking.  He tries to walk 2 miles every am.      Past Medical History:  Diagnosis Date  . Abscess of abdominal wall 11/28/2012  . Bladder cancer ([place])   . CAD (coronary artery disease)    Mild per cath in 2002/last nuclear stress per note  2010  . Colonic diverticular abscess s/p perc drainage x2 11/10/2012  . Colonoscopy refused    Sister died from delayed Dx of post-colonoscopy   . Diabetes mellitus   . Diverticulitis [id number]   "all my life"  . Hyperlipidemia    Is on statin therapy  . Hypertension    LOV  Dr Aundra Dubin with clearance 05/08/11 and EKG  in [facility]   . Normal nuclear stress test 2010   EF 63% with no ischemia  . Skin cancer   . Subclavian steal  syndrome    left; evaluated by Dr. Irish Lack; managed medically , no accurate B/p left arm    Past Surgical History:  Procedure Laterality Date  . APPENDECTOMY    . CARDIAC CATHETERIZATION  2002   He had a which showed mild coronary atherosclerosis and normal left ventricular function.  Marland Kitchen CATARACT EXTRACTION, BILATERAL Bilateral 01-23-13   11'13  . CYSTOSCOPY W/ URETERAL STENT PLACEMENT Bilateral 01/29/2013   Procedure: CYSTOSCOPY WITH BILATERAL RETROGRADE PYELOGRAM/BILATERAL URETERAL STENT PLACEMENT;  Surgeon: Fredricka Bonine, MD;  Location: [facility];  Service: Urology;  Laterality: Bilateral;  . CYSTOSCOPY WITH INJECTION     cystoscopy with BCG injections  . FISTULOTOMY N/A 01/29/2013   Procedure: repair colovesical fustula ;  Surgeon: Adin Hector, MD;  Location: [facility];  Service: General;  Laterality: N/A;  . LAPAROSCOPIC SIGMOID COLECTOMY N/A 01/29/2013   Procedure: SPLENIC FLEXOR MOBILIZATION,LAPAROSCOPIC SIGMOID COLECTOMY, LYSIS OF ADHESIONS, OMENTOPEXY, RIGID PROCTOSCOPY;  Surgeon: Adin Hector, MD;  Location: [facility];  Service: General;  Laterality: N/A;  . PERITONEAL WOUND DRAINAGE  01-23-13   implant of abdomianl drain  . ROTATOR CUFF REPAIR     right  . TONSILLECTOMY    . TRANSURETHRAL RESECTION OF PROSTATE  05/17/2011   Procedure: TRANSURETHRAL RESECTION OF THE PROSTATE WITH GYRUS INSTRUMENTS;  Surgeon: Malka So, MD;  Location: [facility];  Service: Urology;  Laterality: N/A;    Current Medications: Current Meds  Medication Sig  . aspirin 81 MG tablet Take 81 mg by mouth daily.  . Cyanocobalamin (VITAMIN B 12 PO) Take 1 Dose by mouth as directed.   . fish oil-omega-3 fatty acids 1000 MG capsule Take 1 g by mouth daily.   Marland Kitchen glipiZIDE (GLUCOTROL) 5 MG tablet Take 2.5 mg by mouth daily before breakfast.   . metoprolol (LOPRESSOR) 50 MG tablet TAKE 1 TABLET BY MOUTH TWO  TIMES DAILY  . Multiple Vitamin (MULTIVITAMIN) tablet Take 1 tablet by mouth daily.  .  simvastatin (ZOCOR) 40 MG tablet TAKE 1 TABLET BY MOUTH  EVERY EVENING  . tamsulosin (FLOMAX) 0.4 MG CAPS capsule Take 0.4 mg by mouth daily.    Allergies:   Dilaudid [hydromorphone hcl] and Hydromorphone   Social History   Social History  . Marital status: Married    Spouse name: N/A  . Number of children: N/A  . Years of education: N/A   Social History Main Topics  . Smoking status: Current Every Day Smoker    Years: 55.00    Types: Pipe    Last attempt to quit: 05/14/1955  . Smokeless tobacco: Former Systems developer  . Alcohol use No  . Drug use: No  . Sexual activity: Yes   Other Topics Concern  . None   Social History Narrative  . None     Family History:  The patient's family history includes COPD in his father; Hypertension in his mother; Lung cancer in his mother.   ROS:   Please see the history of present illness.    ROS All other systems reviewed and are negative.  No flowsheet data found.     PHYSICAL EXAM:   VS:  BP (!) 150/60   Pulse (!) 50   Ht '6\' 2"'$  (1.88 m)   Wt 158 lb 6.4 oz (71.8 kg)   BMI 20.34 kg/m    GEN: Well nourished, well developed, in no acute distress  HEENT: normal  Neck: no JVD, carotid bruits, or masses Cardiac: RRR; no murmurs, rubs, or gallops,no edema.  Intact distal pulses bilaterally.  Respiratory:  clear to auscultation bilaterally, normal work of breathing GI: soft, nontender, nondistended, + BS MS: no deformity or atrophy  Skin: warm and dry, no rash Neuro:  Alert and Oriented x 3, Strength and sensation are intact Psych: euthymic mood, full affect  Wt Readings from Last 3 Encounters:  07/31/16 158 lb 6.4 oz (71.8 kg)  07/18/15 161 lb (73 kg)  01/18/15 161 lb 3.2 oz (73.1 kg)      Studies/Labs Reviewed:   EKG:  EKG is  ordered today.  The ekg ordered today demonstrates sinus bradycardia with IRBBB and septal infarct  Recent Labs: No results found for requested labs within last 8760 hours.   Lipid Panel    Component  Value Date/Time   CHOL 110 (L) 12/31/2014 1638   TRIG 152 (H) 12/31/2014 1638   HDL 41 12/31/2014 1638   CHOLHDL 2.7 12/31/2014 1638   VLDL 30 12/31/2014 1638   LDLCALC 39 12/31/2014 1638    Additional studies/ records that were reviewed today include:  none    ASSESSMENT:    1. Coronary artery disease involving native coronary artery of native heart without angina pectoris   2. Essential hypertension   3. Subclavian steal syndrome   4. Bradycardia   5. Pure  hypercholesterolemia      PLAN:  In order of problems listed above:  1. ASCAD with minimal CAD by cath in 2002.  He has not had any anginal symptoms.  He will continue on ASA and statin.  2. HTN - BP borderline controlled on current meds.  He will continue on BB.  I have asked him to check his BP daily for a week and call with the results.  3.   Subclavian steal syndrome with left subclavian stenosis with evidence of steal by doppler imaging in 2017.  4.   Drug induced bradycardia with HR 50bpm today and asymptomatic. 5.   Hyperlipidemia with LDL goal < 70.  He will continue on simvastatin. He had labs last month showing an LDL of 46 and HDL of 47. 6.   LLE claudication with PVD - this has improved with walking.  Continue statin and walking program.    Medication Adjustments/Labs and Tests Ordered: Current medicines are reviewed at length with the patient today.  Concerns regarding medicines are outlined above.  Medication changes, Labs and Tests ordered today are listed in the Patient Instructions below.  There are no Patient Instructions on file for this visit.   Signed, Fransico Him, MD  07/31/2016 8:38 AM    [address], [place], [place]  [address] Phone: [phone]; Fax: [phone]

## 2016-07-31 NOTE — Patient Instructions (Signed)

## 2016-08-07 ENCOUNTER — Telehealth: Payer: Self-pay | Admitting: Cardiology

## 2016-08-07 NOTE — Telephone Encounter (Signed)
Patient reports no symptoms. He is taking his medications as instructed. He states his BP was taken as soon as he got up in the morning PRIOR to taking medications.  Since patient is asymptomatic, instructed him to check his BP 2 hours AFTER taking medications and call Monday with results. He understands to call Friday if readings are still elevated or if symptoms occur. He was grateful for call and agrees with treatment plan.

## 2016-08-07 NOTE — Telephone Encounter (Signed)
Patient calling to give BP readings for a week:   (last wed) 08-01-16: BP 183/84 with a pulse of 51  (last thur) 08-02-16: BP 165/87 with a pulse of 52  (last fri)    08-03-16: BP 171/92 with a pulse of 60  (sat)         08-04-16: BP 182/94 with a pulse of 63  (Sun)       08-05-16: BP 176/89 with a pulse of 63  (Mon)       08-06-16: BP 161/84 with a pulse of 56  ( today)    08-07-16: BP 181/90 with a pulse of 54

## 2016-08-13 ENCOUNTER — Telehealth: Payer: Self-pay | Admitting: Cardiology

## 2016-08-13 DIAGNOSIS — I10 Essential (primary) hypertension: Secondary | ICD-10-CM

## 2016-08-13 MED ORDER — LOSARTAN POTASSIUM 25 MG PO TABS: 25.0000 mg | ORAL_TABLET | Freq: Every day | ORAL | 3 refills | Status: DC

## 2016-08-13 NOTE — Telephone Encounter (Signed)
BP too high and cannot increase BB due to resting bradycardia.  Please start Losartan '25mg'$  daily and followu in HTN clinic in 1 week for uptitration of ARB and check on BMET

## 2016-08-13 NOTE — Telephone Encounter (Signed)
Pt calling to report BP readings 2 hours after morning medication--metoprolol '50mg'$   08/08/16 145/75 pulse 47 08/09/16  171/83 pulse 44 08/10/16  185/92 pulse 49 08/12/26 170/87  pulse 46 08/12/16  149/65 pulse 45  08/13/16 136/70 pulse 49

## 2016-08-13 NOTE — Telephone Encounter (Signed)
Pt advised I will forward to Dr Radford Pax for review.

## 2016-08-13 NOTE — Telephone Encounter (Signed)
New Message:    Please call,he needs to give you some information to give to Dr Radford Pax please.

## 2016-08-13 NOTE — Telephone Encounter (Signed)
Pt advised, verbalized understanding, agreed with plan.  

## 2016-08-16 DIAGNOSIS — E119 Type 2 diabetes mellitus without complications: Secondary | ICD-10-CM | POA: Diagnosis not present

## 2016-08-16 DIAGNOSIS — Z961 Presence of intraocular lens: Secondary | ICD-10-CM | POA: Diagnosis not present

## 2016-08-22 ENCOUNTER — Other Ambulatory Visit: Payer: Medicare Other | Admitting: *Deleted

## 2016-08-22 ENCOUNTER — Ambulatory Visit (INDEPENDENT_AMBULATORY_CARE_PROVIDER_SITE_OTHER): Payer: Medicare Other | Admitting: Pharmacist

## 2016-08-22 VITALS — BP 134/82 | HR 49

## 2016-08-22 DIAGNOSIS — I10 Essential (primary) hypertension: Secondary | ICD-10-CM | POA: Diagnosis not present

## 2016-08-22 DIAGNOSIS — I2510 Atherosclerotic heart disease of native coronary artery without angina pectoris: Secondary | ICD-10-CM | POA: Diagnosis not present

## 2016-08-22 LAB — BASIC METABOLIC PANEL
BUN/Creatinine Ratio: 19 (ref 10–24)
BUN: 14 mg/dL (ref 8–27)
CHLORIDE: 95 mmol/L — AB (ref 96–106)
CO2: 26 mmol/L (ref 18–29)
CREATININE: 0.75 mg/dL — AB (ref 0.76–1.27)
Calcium: 9.1 mg/dL (ref 8.6–10.2)
GFR calc Af Amer: 104 mL/min/{1.73_m2} (ref >59)
GFR calc non Af Amer: 90 mL/min/{1.73_m2} (ref >59)
GLUCOSE: 66 mg/dL (ref 65–99)
Potassium: 4.6 mmol/L (ref 3.5–5.2)
Sodium: 139 mmol/L (ref 134–144)

## 2016-08-22 MED ORDER — LOSARTAN POTASSIUM 25 MG PO TABS: 25.0000 mg | ORAL_TABLET | Freq: Every day | ORAL | 1 refills | Status: DC

## 2016-08-22 NOTE — Progress Notes (Signed)
Patient ID: [name]                 DOB: 1940-11-10                      MRN: [id number]     HPI: [name] is a 76 y.o. male patient of Dr. Radford Pax who presents today for hypertension evaluation. PMH includes subclavian steal syndrome, PAD, DM, HTN, hyperlipidemia and diabetes. Since his last visit with Dr. Radford Pax, pt called with elevated BP and was started on losartan '25mg'$  daily (about 10 days ago) as HR would not tolerate increase in BB.   He presents today and states he has been doing well on the new medication. He says his pressure has been lower since starting in the 130s-140s/70s-80s. He also reports that after exercise his pressure runs in the 110s/70s. He denies dizziness during this period.   Current HTN meds:  Metoprolol tartrate '50mg'$  BID Losartan '25mg'$  daily   BP goal: <140/90 (given age)  Family History: The patient's family history includes COPD in his father; Hypertension in his mother; Lung cancer in his mother.   Social History: Smoke pipe 3-4 times per day. No alcohol.   Diet: Most from out. Eats mostly buffets. Drinks 2 cups of decaf coffee per day. Drinks lemonade otherwise.   Exercise: Walks 2.25 miles 5 days a week.   Home BP readings: arm cuff - omron.   Wt Readings from Last 3 Encounters:  07/31/16 158 lb 6.4 oz (71.8 kg)  07/18/15 161 lb (73 kg)  01/18/15 161 lb 3.2 oz (73.1 kg)   BP Readings from Last 3 Encounters:  08/22/16 134/82  07/31/16 (!) 150/60  07/18/15 (!) 126/58   Pulse Readings from Last 3 Encounters:  08/22/16 (!) 49  07/31/16 (!) 50  07/18/15 67    Renal function: CrCl cannot be calculated (Patient's most recent lab result is older than the maximum 21 days allowed.).  Past Medical History:  Diagnosis Date  . Abscess of abdominal wall 11/28/2012  . Bladder cancer ([place])   . CAD (coronary artery disease)    Mild per cath in 2002/last nuclear stress per note  2010  . Colonic diverticular abscess s/p perc drainage x2  11/10/2012  . Colonoscopy refused    Sister died from delayed Dx of post-colonoscopy   . Diabetes mellitus   . Diverticulitis [id number]   "all my life"  . Hyperlipidemia    Is on statin therapy  . Hypertension    LOV  Dr Aundra Dubin with clearance 05/08/11 and EKG  in [facility]   . Normal nuclear stress test 2010   EF 63% with no ischemia  . Skin cancer   . Subclavian steal syndrome    left; evaluated by Dr. Irish Lack; managed medically , no accurate B/p left arm    Current Outpatient Prescriptions on File Prior to Visit  Medication Sig Dispense Refill  . aspirin 81 MG tablet Take 81 mg by mouth daily.    . Cyanocobalamin (VITAMIN B 12 PO) Take 1 Dose by mouth as directed.     . fish oil-omega-3 fatty acids 1000 MG capsule Take 1 g by mouth daily.     Marland Kitchen glipiZIDE (GLUCOTROL) 5 MG tablet Take 2.5 mg by mouth daily before breakfast.     . metoprolol (LOPRESSOR) 50 MG tablet Take 1 tablet (50 mg total) by mouth 2 (two) times daily. 180 tablet 3  . Multiple Vitamin (MULTIVITAMIN) tablet Take  1 tablet by mouth daily.    . simvastatin (ZOCOR) 40 MG tablet Take 1 tablet (40 mg total) by mouth every evening. 90 tablet 3  . tamsulosin (FLOMAX) 0.4 MG CAPS capsule Take 0.4 mg by mouth daily.     No current facility-administered medications on file prior to visit.     Allergies  Allergen Reactions  . Dilaudid [Hydromorphone Hcl] Other (See Comments)    Very sedated with Dilaudid.  Tolerates fentanyl better  . Hydromorphone     Other reaction(s): Other (See Comments) Very sedated with Dilaudid.  Tolerates fentanyl better    Blood pressure 134/82, pulse (!) 49.   Assessment/Plan: Hypertension: BP at goal. HR low but he reports this is baseline for him and has been since starting metoprolol several years ago. Will continue metoprolol tartrate '50mg'$  BID and losartan '25mg'$  daily. BMET today. Follow up with Dr. Radford Pax as recommended and HTN clinic as needed.    Thank you, Lelan Pons. Patterson Hammersmith, [facility]  08/22/2016 9:06 AM

## 2016-08-22 NOTE — Patient Instructions (Signed)
Return for a follow up appointment as recommended with Dr. Radford Pax   Check your blood pressure at home daily (if able) and keep record of the readings.  Take your BP meds as follows: CONTINUE losartan '25mg'$  daily and metoprolol '50mg'$  twice daily   Bring all of your meds, your BP cuff and your record of home blood pressures to your next appointment.  Exercise as you're able, try to walk approximately 30 minutes per day.  Keep salt intake to a minimum, especially watch canned and prepared boxed foods.  Eat more fresh fruits and vegetables and fewer canned items.  Avoid eating in fast food restaurants.    HOW TO TAKE YOUR BLOOD PRESSURE: . Rest 5 minutes before taking your blood pressure. .  Don't smoke or drink caffeinated beverages for at least 30 minutes before. . Take your blood pressure before (not after) you eat. . Sit comfortably with your back supported and both feet on the floor (don't cross your legs). . Elevate your arm to heart level on a table or a desk. . Use the proper sized cuff. It should fit smoothly and snugly around your bare upper arm. There should be enough room to slip a fingertip under the cuff. The bottom edge of the cuff should be 1 inch above the crease of the elbow. . Ideally, take 3 measurements at one sitting and record the average.

## 2016-09-24 DIAGNOSIS — I10 Essential (primary) hypertension: Secondary | ICD-10-CM | POA: Diagnosis not present

## 2016-09-24 DIAGNOSIS — E785 Hyperlipidemia, unspecified: Secondary | ICD-10-CM | POA: Diagnosis not present

## 2016-09-24 DIAGNOSIS — Z79899 Other long term (current) drug therapy: Secondary | ICD-10-CM | POA: Diagnosis not present

## 2016-09-24 DIAGNOSIS — E119 Type 2 diabetes mellitus without complications: Secondary | ICD-10-CM | POA: Diagnosis not present

## 2016-09-24 DIAGNOSIS — M791 Myalgia: Secondary | ICD-10-CM | POA: Diagnosis not present

## 2016-09-24 DIAGNOSIS — Z681 Body mass index (BMI) 19 or less, adult: Secondary | ICD-10-CM | POA: Diagnosis not present

## 2016-10-03 DIAGNOSIS — L570 Actinic keratosis: Secondary | ICD-10-CM | POA: Diagnosis not present

## 2016-10-03 DIAGNOSIS — D1801 Hemangioma of skin and subcutaneous tissue: Secondary | ICD-10-CM | POA: Diagnosis not present

## 2016-10-03 DIAGNOSIS — Z85828 Personal history of other malignant neoplasm of skin: Secondary | ICD-10-CM | POA: Diagnosis not present

## 2016-10-03 DIAGNOSIS — D225 Melanocytic nevi of trunk: Secondary | ICD-10-CM | POA: Diagnosis not present

## 2016-10-03 DIAGNOSIS — D2372 Other benign neoplasm of skin of left lower limb, including hip: Secondary | ICD-10-CM | POA: Diagnosis not present

## 2016-10-03 DIAGNOSIS — L821 Other seborrheic keratosis: Secondary | ICD-10-CM | POA: Diagnosis not present

## 2016-11-13 ENCOUNTER — Telehealth: Payer: Self-pay | Admitting: Cardiology

## 2016-11-13 MED ORDER — LOSARTAN POTASSIUM 50 MG PO TABS: 50.0000 mg | ORAL_TABLET | Freq: Every day | ORAL | 3 refills | Status: DC

## 2016-11-13 NOTE — Telephone Encounter (Signed)
New Message    Pt c/o BP issue: STAT if pt c/o blurred vision, one-sided weakness or slurred speech  1. What are your last 5 BP readings?  180+/90 over the last week , this morning  202/91   2. Are you having any other symptoms (ex. Dizziness, headache, blurred vision, passed out)? No , anxiety pressure in chest   3. What is your BP issue?  Anxiety feeling

## 2016-11-13 NOTE — Addendum Note (Signed)
Addended by: Aris Georgia, Rheannon Cerney L on: 11/13/2016 09:16 AM   Modules accepted: Orders

## 2016-11-13 NOTE — Telephone Encounter (Signed)
I spoke with pt. He reports reading of 202/91 was this AM before taking his medications. He took his AM medications about one hour ago and BP is now 184/81. He checks BP about 3 times per day and for last week or soit has been running [id number] systolic.  Heart rate upper 40's to low 50's.  He reports a feeling in his chest which he can't describe but states it is an "excited feeling" and "anxiety feeling."  It is all around the center of his chest. Not painful. No shortness of breath.  Has had this in the past off and on but has now been constant for about 10 days.  Not sleeping well. Will forward to Dr Radford Pax for review/recommendations.

## 2016-11-13 NOTE — Telephone Encounter (Signed)
Increase Losartan to 50mg  daily and followup in 1 week in HTN clinic.  Please also get him in to see extender regarding funny feeling in his chest

## 2016-11-13 NOTE — Telephone Encounter (Signed)
Called patient back with Dr. Theodosia Blender recommendations. Patient will start Losartan 50 mg daily and see HTN clinic on Monday. Patient has first available appointment with one of th PA's on 12/05/16. Encouraged patient to give our office a call if BP does not improve. Informed patient to go to ED if his chest pressure does not get better or gets worse before his appointment. Patient verbalized understanding.

## 2016-11-19 ENCOUNTER — Ambulatory Visit (INDEPENDENT_AMBULATORY_CARE_PROVIDER_SITE_OTHER): Payer: Medicare Other | Admitting: Pharmacist

## 2016-11-19 VITALS — BP 138/62 | HR 52

## 2016-11-19 DIAGNOSIS — I10 Essential (primary) hypertension: Secondary | ICD-10-CM | POA: Diagnosis not present

## 2016-11-19 DIAGNOSIS — I2510 Atherosclerotic heart disease of native coronary artery without angina pectoris: Secondary | ICD-10-CM

## 2016-11-19 NOTE — Progress Notes (Signed)
Patient ID: [name]                 DOB: [birth year]                      MRN: [id number]     HPI: [name] is a 76 y.o. male patient of Dr. Radford Pax who presents today for hypertension evaluation. PMH includes subclavian steal syndrome, PAD, DM, HTN, and HLD. Pt was taking metoprolol tartrate 50 mg BID and was started on losartan 25mg  daily due to elevated BP after his last office visit with Dr. Radford Pax. His losartan dose was increased again to 50 mg on 11/13/16 due to elevated BP readings at home. Pt presents today for f/u.   Pt presents to clinic today in good spirit. Reports that he has been tolerating the 50 mg dose of losartan well overall. He states that he was experiencing a little GI discomfort and weakness after starting the higher dose but nothing that affects daily activities. Pt denies dizziness, headaches, or falls. His home BP readings have been 120s to 130s/60s to 70s after he takes losartan and his first dose of metoprolol. By 2 or 3 PM, BP goes up again to 140s and by 6 pm to 150s. He takes his second metoprolol dose after dinner. HR in clinic today was 53. BP in clinic was 138/62 mmHg.   Current HTN meds: metoprolol tartrate 50 mg BID, losartan 50 mg daily  BP goal: <140/90 mmHg (given age)  Family History: The patient's family history includes COPD in his father; Hypertension in his mother; Lung cancer in his mother.  Social History: Smoke pipe 3-4 times per day. No alcohol.  Diet: Most from out. Eats mostly buffets. Drinks 2 cups of decaf coffee per day. Drinks lemonade otherwise.   Exercise: Walks 2.25 miles 5 days a week.   Home BP readings: 120s to 130s/60s to 70s after he takes losartan and his first dose of metoprolol. By 2 or 3 PM, BP goes up again to 140s and by 6 pm to 150s.  Wt Readings from Last 3 Encounters:  07/31/16 158 lb 6.4 oz (71.8 kg)  07/18/15 161 lb (73 kg)  01/18/15 161 lb 3.2 oz (73.1 kg)   BP Readings from Last 3 Encounters:  08/22/16  134/82  07/31/16 (!) 150/60  07/18/15 (!) 126/58   Pulse Readings from Last 3 Encounters:  08/22/16 (!) 49  07/31/16 (!) 50  07/18/15 67    Renal function: CrCl cannot be calculated (Patient's most recent lab result is older than the maximum 21 days allowed.).  Past Medical History:  Diagnosis Date  . Abscess of abdominal wall 11/28/2012  . Bladder cancer ([place])   . CAD (coronary artery disease)    Mild per cath in 2002/last nuclear stress per note  2010  . Colonic diverticular abscess s/p perc drainage x2 11/10/2012  . Colonoscopy refused    Sister died from delayed Dx of post-colonoscopy   . Diabetes mellitus   . Diverticulitis [id number]   "all my life"  . Hyperlipidemia    Is on statin therapy  . Hypertension    LOV  Dr Aundra Dubin with clearance 05/08/11 and EKG  in [facility]   . Normal nuclear stress test 2010   EF 63% with no ischemia  . Skin cancer   . Subclavian steal syndrome    left; evaluated by Dr. Irish Lack; managed medically , no accurate B/p left arm  Current Outpatient Prescriptions on File Prior to Visit  Medication Sig Dispense Refill  . aspirin 81 MG tablet Take 81 mg by mouth daily.    . Cyanocobalamin (VITAMIN B 12 PO) Take 1 Dose by mouth as directed.     . fish oil-omega-3 fatty acids 1000 MG capsule Take 1 g by mouth daily.     Marland Kitchen glipiZIDE (GLUCOTROL) 5 MG tablet Take 2.5 mg by mouth daily before breakfast.     . losartan (COZAAR) 50 MG tablet Take 1 tablet (50 mg total) by mouth daily. 90 tablet 3  . metoprolol (LOPRESSOR) 50 MG tablet Take 1 tablet (50 mg total) by mouth 2 (two) times daily. 180 tablet 3  . Multiple Vitamin (MULTIVITAMIN) tablet Take 1 tablet by mouth daily.    . orphenadrine (NORFLEX) 100 MG tablet Take 1 tablet by mouth 2 (two) times daily.  1  . simvastatin (ZOCOR) 40 MG tablet Take 1 tablet (40 mg total) by mouth every evening. 90 tablet 3  . tamsulosin (FLOMAX) 0.4 MG CAPS capsule Take 0.4 mg by mouth daily.    . temazepam (RESTORIL)  15 MG capsule Take 15 mg by mouth at bedtime as needed for sleep.     No current facility-administered medications on file prior to visit.     Allergies  Allergen Reactions  . Dilaudid [Hydromorphone Hcl] Other (See Comments)    Very sedated with Dilaudid.  Tolerates fentanyl better  . Hydromorphone     Other reaction(s): Other (See Comments) Very sedated with Dilaudid.  Tolerates fentanyl better     Assessment/Plan:  1. Hypertension - Pt's BP was at goal of <140/90 mmHg. Overall, pt has been tolerating the 50 mg dose of losartan well. In order to help the pt gain better BP control throughout the day, will split losartan dosing and change from 50 mg daily to 25 mg BID. Continue metoprolol tartrate 50 mg BID. Encourage pt to keep checking BP at home. F/u already scheduled with PA in 2 weeks. F/u with HTN clinic as needed.   -Barkley Boards, PharmD Student   Megan E. Supple, PharmD, CPP, [facility] [address] [place], [place] [address] Phone: [phone]; Fax: [phone] 11/19/2016 3:41 PM

## 2016-11-19 NOTE — Patient Instructions (Addendum)
It was nice meeting you today!  Your blood pressure today was at goal.   To help you gain better blood pressure control throughout the day, start taking losartan 25 mg by mouth 1 tablet in the mornings and 1 tablet in the evenings along with your metoprolol tartrate.  Keep checking your blood pressure at home and please contact us if the readings begin to trend up persistently.   Follow up with [facility] as scheduled. Follow up with [place] if needed.

## 2016-12-04 NOTE — Progress Notes (Signed)
Cardiology Office Note    Date:  12/05/2016   ID:  [name], [name] 10-07-1940, MRN [id number]  PCP:  Janine Limbo, PA-C  Cardiologist: Dr. Radford Pax  Chief Complaint: High blood pressure evaluation   History of Present Illness:   [name] is a 76 y.o. male with hx of PAD, DM, HTN, HLD, Colon cancer and subclavian steal syndrome presented for high blood pressure evaluation.   He had a cath in 2002 with minimal disease. He has left subclavian stenosis with evidence for steal by doppler imaging, last dopplers done in 9/15. He does not have symptoms of arm claudication or cerebral ischemia. Peripheral arterial dopplers in 9/16 showed bilateral L>R SFA disease. He has had significant claudication. He was seen by Dr Fletcher Anon who recommended angiography, but he did not want to have the procedure done. He was started on cilostazol but this caused abdominal pain.   Recently noted elevated blood pressure and started on Losartan in addition of metoprolol tartrate 50mg  BID. Adjusted losartan dosing to 25mg  BID during hypertension clinic visit 11/19/16.  Here today for follow up. His blood pressure runs high when he walks up. This morning it was 185/85. During daytime and evening his blood pressure consent 130-135/60-70s. Patient reports not sleeping well. He goes to bed @ 10 PM however wakes up around 1-2 am. This pain ongoing for the past one year. He has tried temazepam and melatonin without any improvement. He denies dizziness, orthopnea, PND, syncope, lower extremity edema, melena or blood in the stool or urine. Complains of intermittent gas since taking the losartan 14mh bid --> relieves with Simethicone. He has a history of colectomy. No history of GERD. He continues to walk 2 miles in AM.    Past Medical History:  Diagnosis Date  . Abscess of abdominal wall 11/28/2012  . Bladder cancer ([place])   . CAD (coronary artery disease)    Mild per cath in 2002/last nuclear stress per note   2010  . Colonic diverticular abscess s/p perc drainage x2 11/10/2012  . Colonoscopy refused    Sister died from delayed Dx of post-colonoscopy   . Diabetes mellitus   . Diverticulitis 4917-9150   "all my life"  . Hyperlipidemia    Is on statin therapy  . Hypertension    LOV  Dr Aundra Dubin with clearance 05/08/11 and EKG  in [facility]   . Normal nuclear stress test 2010   EF 63% with no ischemia  . Skin cancer   . Subclavian steal syndrome    left; evaluated by Dr. Irish Lack; managed medically , no accurate B/p left arm    Past Surgical History:  Procedure Laterality Date  . APPENDECTOMY    . CARDIAC CATHETERIZATION  2002   He had a which showed mild coronary atherosclerosis and normal left ventricular function.  Marland Kitchen CATARACT EXTRACTION, BILATERAL Bilateral 01-23-13   11'13  . CYSTOSCOPY W/ URETERAL STENT PLACEMENT Bilateral 01/29/2013   Procedure: CYSTOSCOPY WITH BILATERAL RETROGRADE PYELOGRAM/BILATERAL URETERAL STENT PLACEMENT;  Surgeon: Fredricka Bonine, MD;  Location: [facility];  Service: Urology;  Laterality: Bilateral;  . CYSTOSCOPY WITH INJECTION     cystoscopy with BCG injections  . FISTULOTOMY N/A 01/29/2013   Procedure: repair colovesical fustula ;  Surgeon: Adin Hector, MD;  Location: [facility];  Service: General;  Laterality: N/A;  . LAPAROSCOPIC SIGMOID COLECTOMY N/A 01/29/2013   Procedure: SPLENIC FLEXOR MOBILIZATION,LAPAROSCOPIC SIGMOID COLECTOMY, LYSIS OF ADHESIONS, OMENTOPEXY, RIGID PROCTOSCOPY;  Surgeon: Adin Hector, MD;  Location: [facility];  Service: General;  Laterality: N/A;  . PERITONEAL WOUND DRAINAGE  01-23-13   implant of abdomianl drain  . ROTATOR CUFF REPAIR     right  . TONSILLECTOMY    . TRANSURETHRAL RESECTION OF PROSTATE  05/17/2011   Procedure: TRANSURETHRAL RESECTION OF THE PROSTATE WITH GYRUS INSTRUMENTS;  Surgeon: Malka So, MD;  Location: [facility];  Service: Urology;  Laterality: N/A;    Current Medications: Prior to Admission medications     Medication Sig Start Date End Date Taking? Authorizing Provider  aspirin 81 MG tablet Take 81 mg by mouth daily.    [provider]  Cyanocobalamin (VITAMIN B 12 PO) Take 1 Dose by mouth as directed.     [provider]  fish oil-omega-3 fatty acids 1000 MG capsule Take 1 g by mouth daily.     [provider]  glipiZIDE (GLUCOTROL) 5 MG tablet Take 2.5 mg by mouth daily before breakfast.     [provider]  losartan (COZAAR) 50 MG tablet Take 0.5 tablets (25 mg total) by mouth 2 (two) times daily. 11/19/16   Sueanne Margarita, MD  metoprolol (LOPRESSOR) 50 MG tablet Take 1 tablet (50 mg total) by mouth 2 (two) times daily. 07/31/16   Sueanne Margarita, MD  Multiple Vitamin (MULTIVITAMIN) tablet Take 1 tablet by mouth daily.    [provider]  orphenadrine (NORFLEX) 100 MG tablet Take 1 tablet by mouth 2 (two) times daily as needed.  09/24/16   [provider]  simvastatin (ZOCOR) 40 MG tablet Take 1 tablet (40 mg total) by mouth every evening. 07/31/16   Sueanne Margarita, MD  tamsulosin (FLOMAX) 0.4 MG CAPS capsule Take 0.4 mg by mouth daily. 11/09/14   [provider]  temazepam (RESTORIL) 15 MG capsule Take 15 mg by mouth at bedtime as needed for sleep.    [provider]    Allergies:   Dilaudid [hydromorphone hcl] and Hydromorphone   Social History   Social History  . Marital status: Married    Spouse name: N/A  . Number of children: N/A  . Years of education: N/A   Social History Main Topics  . Smoking status: Current Every Day Smoker    Years: 55.00    Types: Pipe    Last attempt to quit: 05/14/1955  . Smokeless tobacco: Former Systems developer  . Alcohol use No  . Drug use: No  . Sexual activity: Yes   Other Topics Concern  . None   Social History Narrative  . None     Family History:  The patient's family history includes COPD in his father; Hypertension in his mother; Lung cancer in his mother.   ROS:   Please  see the history of present illness.    ROS All other systems reviewed and are negative.   PHYSICAL EXAM:   VS:  BP (!) 150/70   Pulse (!) 50   Ht 6\' 2"  (1.88 m)   Wt 153 lb 12.8 oz (69.8 kg)   BMI 19.75 kg/m    GEN: Well nourished, well developed, in no acute distress. Seems anxious, tapping his feet on the floor HEENT: normal  Neck: no JVD, carotid bruits, or masses Cardiac: RRR; no murmurs, rubs, or gallops,no edema . Weak LUE pulse Respiratory:  clear to auscultation bilaterally, normal work of breathing GI: soft, nontender, nondistended, + BS MS: no deformity or atrophy  Skin: warm and dry, no rash Neuro:  Alert and  Oriented x 3, Strength and sensation are intact Psych: euthymic mood, full affect  Wt Readings from Last 3 Encounters:  12/05/16 153 lb 12.8 oz (69.8 kg)  07/31/16 158 lb 6.4 oz (71.8 kg)  07/18/15 161 lb (73 kg)      Studies/Labs Reviewed:   EKG:  EKG is not ordered today.   Recent Labs: 08/22/2016: BUN 14; Creatinine, Ser 0.75; Potassium 4.6; Sodium 139   Lipid Panel    Component Value Date/Time   CHOL 110 (L) 12/31/2014 1638   TRIG 152 (H) 12/31/2014 1638   HDL 41 12/31/2014 1638   CHOLHDL 2.7 12/31/2014 1638   VLDL 30 12/31/2014 1638   LDLCALC 39 12/31/2014 1638    Additional studies/ records that were reviewed today include:   As above    ASSESSMENT & PLAN:    1. HTN - Blood pressure runs in 130-135-60-70 during daytime and evening. However, high in early AM before taking medications. This morning it was 185/85. Here 150/70 after AM medications. Repeat check by me 142/76. The patient is very anxious and not sleeping well at night.  This could contributing to his elevated BP in AM. Discussed follow up with PCP for sleeping aid however declined. Advised breathing and relaxation techniques. Will increase Losartan to 50mg  PM. Watch if symptoms of gas gets worse or not. F/u in hypertension clinic next week.  Seems he has restless leg  syndrome.  2. HLD - Continue statin. Followed by PCP  3. PVD with LLE claudication - Continue walking  4. Steal syndrome with left subclavian stenosis with evidence of steal by doppler imaging in 2017.   5. CAD - minimal disease by cath. No angina.   Medication Adjustments/Labs and Tests Ordered: Current medicines are reviewed at length with the patient today.  Concerns regarding medicines are outlined above.  Medication changes, Labs and Tests ordered today are listed in the Patient Instructions below. Patient Instructions  Medication Instructions:  Your physician has recommended you make the following change in your medication:  1.  INCREASE the Losartan to 50 mg taking 1/2 tablet in the morning and 1 whole tablet at bedtime  Labwork: None ordered  Testing/Procedures: None ordered  Follow-Up: Your physician recommends that you schedule a follow-up appointment in: [place]   Any Other Special Instructions Will Be Listed Below (If Applicable).    If you need a refill on your cardiac medications before your next appointment, please call your pharmacy.      Jarrett Soho, [place]  12/05/2016 8:28 AM    [facility] [place], [place], [place]  [address] Phone: [phone]; Fax: [phone]

## 2016-12-05 ENCOUNTER — Encounter: Payer: Self-pay | Admitting: Physician Assistant

## 2016-12-05 ENCOUNTER — Ambulatory Visit (INDEPENDENT_AMBULATORY_CARE_PROVIDER_SITE_OTHER): Payer: Medicare Other | Admitting: Physician Assistant

## 2016-12-05 ENCOUNTER — Encounter (INDEPENDENT_AMBULATORY_CARE_PROVIDER_SITE_OTHER): Payer: Self-pay

## 2016-12-05 VITALS — BP 150/70 | HR 50 | Ht 74.0 in | Wt 153.8 lb

## 2016-12-05 DIAGNOSIS — I2510 Atherosclerotic heart disease of native coronary artery without angina pectoris: Secondary | ICD-10-CM | POA: Diagnosis not present

## 2016-12-05 DIAGNOSIS — I10 Essential (primary) hypertension: Secondary | ICD-10-CM | POA: Diagnosis not present

## 2016-12-05 DIAGNOSIS — G458 Other transient cerebral ischemic attacks and related syndromes: Secondary | ICD-10-CM | POA: Diagnosis not present

## 2016-12-05 DIAGNOSIS — I739 Peripheral vascular disease, unspecified: Secondary | ICD-10-CM

## 2016-12-05 DIAGNOSIS — E7800 Pure hypercholesterolemia, unspecified: Secondary | ICD-10-CM | POA: Diagnosis not present

## 2016-12-05 MED ORDER — LOSARTAN POTASSIUM 50 MG PO TABS: ORAL_TABLET | ORAL | 3 refills | Status: DC

## 2016-12-05 NOTE — Patient Instructions (Addendum)
Medication Instructions:  Your physician has recommended you make the following change in your medication:  1.  INCREASE the Losartan to 50 mg taking 1/2 tablet in the morning and 1 whole tablet at bedtime  Labwork: None ordered  Testing/Procedures: None ordered  Follow-Up: Your physician recommends that you schedule a follow-up appointment in: [place]   Any Other Special Instructions Will Be Listed Below (If Applicable).    If you need a refill on your cardiac medications before your next appointment, please call your pharmacy.

## 2016-12-13 ENCOUNTER — Ambulatory Visit (INDEPENDENT_AMBULATORY_CARE_PROVIDER_SITE_OTHER): Payer: Medicare Other | Admitting: Pharmacist

## 2016-12-13 ENCOUNTER — Encounter: Payer: Self-pay | Admitting: Pharmacist

## 2016-12-13 VITALS — BP 142/62 | HR 50

## 2016-12-13 DIAGNOSIS — I2510 Atherosclerotic heart disease of native coronary artery without angina pectoris: Secondary | ICD-10-CM | POA: Diagnosis not present

## 2016-12-13 DIAGNOSIS — I10 Essential (primary) hypertension: Secondary | ICD-10-CM | POA: Diagnosis not present

## 2016-12-13 NOTE — Progress Notes (Signed)
Patient ID: [name]                 DOB: 1940/10/30                      MRN: [id number]     HPI: [name] is a 76 y.o. male patient of Dr. Sindy Guadeloupe presents today for hypertension follow up.PMH includes subclavian steal syndrome, PAD, DM, HTN, and HLD. Pt was previously seen in HTN clinic and his losartan dose was split 25mg  BID to help control elevated morning pressures. At his visit 1 week ago with Robbie Lis, PA his evening dose of losartan was increased to 50mg  daily as pressures remained elevated in the mornings. He reported being extremely anxious overnight and it was advised he follow up with PCP as well.   Pt presents today for f/u. He states that he was working in the yard for about 2 hours yesterday and he started feeling weak and SBP was around 95. Overall, states that he was feeling about the same since the dose increase. States that his BP tends to go down when he is exercising/doing yard work. No dizziness, lightheadedness, or falls. No shortness of breath. He takes his blood pressure in the mornings before taking medications and it's usually 170s to 180s, sometimes higher. He checks again around lunch time and it goes down to 120s to 150s and slowly creeps up as the day progresses. Pt brought his home cuff with him today. BP with home cuff is 144/77, and BP with manual cuff in clinic is 142/62. HR is 50 in clinic.   Current HTN meds: metoprolol tartrate 50 mg BID (7am and 7pm), losartan 25mg  Qam(7am) and 50 mg Qpm (7pm) BP goal: <140/90 mmHg (given age)  Family History: The patient's family history includes COPD in his father; Hypertension in his mother; Lung cancer in his mother.  Social History: Smoke pipe 3-4 times per day. No alcohol.  Diet: Most from out. Eats mostly buffets. Drinks 2 cups of decaf coffee per day. Drinks lemonade otherwise.   Exercise: Walks 2.25 miles 5 days a week.   Home BP readings:  Ranges from 110s to 160s/60s to 80s, with some  values in the 170s to 190s.   Wt Readings from Last 3 Encounters:  12/05/16 153 lb 12.8 oz (69.8 kg)  07/31/16 158 lb 6.4 oz (71.8 kg)  07/18/15 161 lb (73 kg)   BP Readings from Last 3 Encounters:  12/13/16 (!) 142/62  12/05/16 (!) 150/70  11/19/16 138/62   Pulse Readings from Last 3 Encounters:  12/13/16 (!) 50  12/05/16 (!) 50  11/19/16 (!) 52    Renal function: CrCl cannot be calculated (Patient's most recent lab result is older than the maximum 21 days allowed.).  Past Medical History:  Diagnosis Date  . Abscess of abdominal wall 11/28/2012  . Bladder cancer ([place])   . CAD (coronary artery disease)    Mild per cath in 2002/last nuclear stress per note  2010  . Colonic diverticular abscess s/p perc drainage x2 11/10/2012  . Colonoscopy refused    Sister died from delayed Dx of post-colonoscopy   . Diabetes mellitus   . Diverticulitis [id number]   "all my life"  . Hyperlipidemia    Is on statin therapy  . Hypertension    LOV  Dr Aundra Dubin with clearance 05/08/11 and EKG  in [facility]   . Normal nuclear stress test 2010   EF 63% with  no ischemia  . Skin cancer   . Subclavian steal syndrome    left; evaluated by Dr. Irish Lack; managed medically , no accurate B/p left arm    Current Outpatient Prescriptions on File Prior to Visit  Medication Sig Dispense Refill  . aspirin 81 MG tablet Take 81 mg by mouth daily.    . Cyanocobalamin (VITAMIN B 12 PO) Take 1 Dose by mouth as directed.     . fish oil-omega-3 fatty acids 1000 MG capsule Take 1 g by mouth daily.     Marland Kitchen glipiZIDE (GLUCOTROL) 5 MG tablet Take 2.5 mg by mouth daily before breakfast.     . losartan (COZAAR) 50 MG tablet TAKE 1/2 TABLET BY MOUTH IN THE A.M. AND TAKE 1 TABLET BY MOUTH AT BEDTIME 135 tablet 3  . metoprolol (LOPRESSOR) 50 MG tablet Take 1 tablet (50 mg total) by mouth 2 (two) times daily. 180 tablet 3  . Multiple Vitamin (MULTIVITAMIN) tablet Take 1 tablet by mouth daily.    . orphenadrine (NORFLEX) 100 MG  tablet Take 1 tablet by mouth 2 (two) times daily as needed.   1  . simvastatin (ZOCOR) 40 MG tablet Take 1 tablet (40 mg total) by mouth every evening. 90 tablet 3  . tamsulosin (FLOMAX) 0.4 MG CAPS capsule Take 0.4 mg by mouth daily.     No current facility-administered medications on file prior to visit.     Allergies  Allergen Reactions  . Dilaudid [Hydromorphone Hcl] Other (See Comments)    Very sedated with Dilaudid.  Tolerates fentanyl better  . Hydromorphone     Other reaction(s): Other (See Comments) Very sedated with Dilaudid.  Tolerates fentanyl better    Blood pressure (!) 142/62, pulse (!) 50, SpO2 97 %.   Assessment/Plan: Hypertension: Pt's BP today was slightly above his goal of <140/90 mmHg. Pt brought in his home cuff and it showed a very similar reading to the manual cuff in clinic. Based on the readings stored in his cuff, his home pressures tend to fluctuate and stay mostly above his BP goal. Increase losartan dose to 50 mg in the mornings and 50 mg in the evenings to control BP more effectively throughout the afternoons. Continue metoprolol tartrate as prescribed. Encouraged pt to monitor BP at home about 2 hours after morning and evening medications. F/u with pt in 4 weeks for BP evaluation and BMET.  -Barkley Boards, PharmD Student    Thank you, Lelan Pons. Patterson Hammersmith, [facility]  12/13/2016 8:45 PM

## 2016-12-13 NOTE — Patient Instructions (Addendum)
It was good seeing you today.  Your blood pressure is slightly elevated today.   Start taking losartan 50 mg in the mornings and 50 mg at night. Continue your metoprolol as prescribed.   Try to check your blood pressure 2 hours after your morning medications and 2 hours after your evening medications.  Keep a written log of your blood pressure readings and bring them in with you at your future visits.   If you notice your blood pressure is persistently high or low or if you experience increased dizziness, lightheadedness, or weakness, please contact [phone].   Follow up for lab work and blood pressure evaluation on 01/08/17 at 8:30 AM.

## 2016-12-25 DIAGNOSIS — E785 Hyperlipidemia, unspecified: Secondary | ICD-10-CM | POA: Diagnosis not present

## 2016-12-25 DIAGNOSIS — J309 Allergic rhinitis, unspecified: Secondary | ICD-10-CM | POA: Diagnosis not present

## 2016-12-25 DIAGNOSIS — Z79899 Other long term (current) drug therapy: Secondary | ICD-10-CM | POA: Diagnosis not present

## 2016-12-25 DIAGNOSIS — E119 Type 2 diabetes mellitus without complications: Secondary | ICD-10-CM | POA: Diagnosis not present

## 2016-12-25 DIAGNOSIS — I10 Essential (primary) hypertension: Secondary | ICD-10-CM | POA: Diagnosis not present

## 2016-12-25 DIAGNOSIS — Z1389 Encounter for screening for other disorder: Secondary | ICD-10-CM | POA: Diagnosis not present

## 2016-12-25 DIAGNOSIS — Z9181 History of falling: Secondary | ICD-10-CM | POA: Diagnosis not present

## 2016-12-25 DIAGNOSIS — Z681 Body mass index (BMI) 19 or less, adult: Secondary | ICD-10-CM | POA: Diagnosis not present

## 2017-01-08 ENCOUNTER — Other Ambulatory Visit: Payer: Medicare Other

## 2017-01-08 ENCOUNTER — Ambulatory Visit (INDEPENDENT_AMBULATORY_CARE_PROVIDER_SITE_OTHER): Payer: Medicare Other | Admitting: Pharmacist

## 2017-01-08 DIAGNOSIS — I2510 Atherosclerotic heart disease of native coronary artery without angina pectoris: Secondary | ICD-10-CM

## 2017-01-08 DIAGNOSIS — I10 Essential (primary) hypertension: Secondary | ICD-10-CM

## 2017-01-08 LAB — BASIC METABOLIC PANEL
BUN/Creatinine Ratio: 19 (ref 10–24)
BUN: 15 mg/dL (ref 8–27)
CHLORIDE: 93 mmol/L — AB (ref 96–106)
CO2: 25 mmol/L (ref 20–29)
Calcium: 9.1 mg/dL (ref 8.6–10.2)
Creatinine, Ser: 0.79 mg/dL (ref 0.76–1.27)
GFR calc Af Amer: 101 mL/min/{1.73_m2} (ref >59)
GFR calc non Af Amer: 87 mL/min/{1.73_m2} (ref >59)
GLUCOSE: 81 mg/dL (ref 65–99)
POTASSIUM: 4.5 mmol/L (ref 3.5–5.2)
SODIUM: 133 mmol/L — AB (ref 134–144)

## 2017-01-08 MED ORDER — LOSARTAN POTASSIUM 50 MG PO TABS
ORAL_TABLET | ORAL | 2 refills | Status: DC
Start: 2017-01-08 — End: 2017-02-05

## 2017-01-08 NOTE — Patient Instructions (Addendum)
Thank you for coming to visit [place] today!  Your blood pressure today was slightly elevated today at 142/64, your goal is 140/90.   Continue taking metoprolol tartrate 50mg  and losartan 50mg  every morning and every night.  Change your evening doses to right before bed (around 10pm).   Continue monitoring your blood pressure at home.   Consider talking to your primary care physician about difficulty with sleeping.   Call us with any questions or concerns at [phone].  Hope you have a great day!

## 2017-01-08 NOTE — Progress Notes (Signed)
Patient ID: [name]                 DOB: [birth year]                      MRN: [id number]     HPI: [name] is a 76 y.o. male referred by Dr. Radford Pax to HTN clinic. PMH is significant for subclavian steal syndrome, PAD, DM, HTN, and HLD. Pt was previously seen in HTN clinic and his losartan dose was split 25mg  BID to help control elevated morning pressures. At his visit on 12/05/16 with Antonio Lis, PA his evening dose of losartan was increased to 50mg  daily as pressures remained elevated in the mornings. He reported being extremely anxious overnight and it was advised he follow up with PCP as well. At f/u on 12/13/16 pt states that his BP tends to go down when he is exercising/doing yard work. He takes his blood pressure in the mornings before taking medications and it's usually 170s to 180s, sometimes higher. Losartan dose was increased from 25mg  BID to 50mg  BID on 12/13/2016.   Pt presents today for hypertension follow up. He denies any dizziness, lightheadedness, headaches, blurry vision, or falls. He states that he has been tolerating the increased dose of losartan 50mg  BID well. His BP readings at home continue to fluctuate, lowest is when he is working outside (105/65) and highest when he first wakes up (194/95). He states that he does not sleep well which could be contributing to high readings in the morning.    Current HTN meds: metoprolol tartrate 50 mg BID (7am and 7pm), losartan 50mg  Qam(7am) and 50 mg Qpm (7pm)  BP goal: <140/90 mmHg (given age)   Family History: The patient's family history includes COPD in his father; Hypertension in his mother; Lung cancer in his mother.  Social History: Smoke pipe 3-4 times per day. No alcohol.   Diet: Most from out. Eats mostly buffets. Drinks 2 cups of decaf coffee per day. Drinks lemonade otherwise. [employer] is go to [employer].   Exercise: Walks 2.25 miles 5 days a week.   Home BP readings:  (BP reading when first waking  up, 2 hours after AM dose, and 2 hours after PM dose).  16 readings prior to morning medication ; average reading 188/92 15 readings 2hrs after morning medication; average 169/70 15 readings in the evening; average 143/72  Home BP device calibrated on 12/13/2016 and determined to be accurate within 56mmHg  Wt Readings from Last 3 Encounters:  12/05/16 153 lb 12.8 oz (69.8 kg)  07/31/16 158 lb 6.4 oz (71.8 kg)  07/18/15 161 lb (73 kg)   BP Readings from Last 3 Encounters:  01/08/17 (!) 142/64  12/13/16 (!) 142/62  12/05/16 (!) 150/70   Pulse Readings from Last 3 Encounters:  01/08/17 (!) 52  12/13/16 (!) 50  12/05/16 (!) 50    Renal function: CrCl cannot be calculated (Patient's most recent lab result is older than the maximum 21 days allowed.).  Past Medical History:  Diagnosis Date  . Abscess of abdominal wall 11/28/2012  . Bladder cancer ([place])   . CAD (coronary artery disease)    Mild per cath in 2002/last nuclear stress per note  2010  . Colonic diverticular abscess s/p perc drainage x2 11/10/2012  . Colonoscopy refused    Sister died from delayed Dx of post-colonoscopy   . Diabetes mellitus   . Diverticulitis [id number]   "all my life"  .  Hyperlipidemia    Is on statin therapy  . Hypertension    LOV  Dr Aundra Dubin with clearance 05/08/11 and EKG  in [facility]   . Normal nuclear stress test 2010   EF 63% with no ischemia  . Skin cancer   . Subclavian steal syndrome    left; evaluated by Dr. Irish Lack; managed medically , no accurate B/p left arm    Current Outpatient Prescriptions on File Prior to Visit  Medication Sig Dispense Refill  . aspirin 81 MG tablet Take 81 mg by mouth daily.    . Cyanocobalamin (VITAMIN B 12 PO) Take 1 Dose by mouth as directed.     . fish oil-omega-3 fatty acids 1000 MG capsule Take 1 g by mouth daily.     Marland Kitchen glipiZIDE (GLUCOTROL) 5 MG tablet Take 2.5 mg by mouth daily before breakfast.     . metoprolol (LOPRESSOR) 50 MG tablet Take 1 tablet (50  mg total) by mouth 2 (two) times daily. 180 tablet 3  . Multiple Vitamin (MULTIVITAMIN) tablet Take 1 tablet by mouth daily.    . orphenadrine (NORFLEX) 100 MG tablet Take 1 tablet by mouth 2 (two) times daily as needed.   1  . simvastatin (ZOCOR) 40 MG tablet Take 1 tablet (40 mg total) by mouth every evening. 90 tablet 3  . tamsulosin (FLOMAX) 0.4 MG CAPS capsule Take 0.4 mg by mouth daily.     No current facility-administered medications on file prior to visit.     Allergies  Allergen Reactions  . Dilaudid [Hydromorphone Hcl] Other (See Comments)    Very sedated with Dilaudid.  Tolerates fentanyl better  . Hydromorphone     Other reaction(s): Other (See Comments) Very sedated with Dilaudid.  Tolerates fentanyl better     Assessment/Plan:  1. Hypertension: Systolic BP was slightly above goal of <509/32IZTI with diastolic BP well under 45YKDX. Pt's home BP readings have been at goal 2 hours after taking medications, however continue to be elevated when he first wakes up (180s-190s/80s-90s).  Pt takes evening doses at 7pm and goes to sleep around 10:30pm.  Plan to continue metoprolol tartrate 50mg  BID and losartan 50mg  BID.  Change evening doses of metoprolol and losartan to right before bed (10pm). Discussed sleep hygiene with patient and to f/u with his PCP concerning trouble sleeping. Encouraged pt to continue checking his BP at home. F/u with pt in 4 weeks for BP evaluation. Will f/u tomorrow on BMET results from today.  May change losartan to Irbesartan or metoprolol to carvedilol during next office visit if additional BP control needed.  Pt seen with pharmacy student, Maple Mirza, and PGY1 pharmacy resident, Ulanda Edison.   Clinical decision discussed with me. Agreed with current plan and to continue close monitoring.  Arnulfo Batson Rodriguez-Guzman PharmD, BCPS, [place] [id number] 01/08/2017 2:17 PM

## 2017-01-19 DIAGNOSIS — Z23 Encounter for immunization: Secondary | ICD-10-CM | POA: Diagnosis not present

## 2017-02-05 ENCOUNTER — Ambulatory Visit (INDEPENDENT_AMBULATORY_CARE_PROVIDER_SITE_OTHER): Payer: Medicare Other | Admitting: Pharmacist

## 2017-02-05 VITALS — BP 136/60 | HR 54 | Wt 154.0 lb

## 2017-02-05 DIAGNOSIS — I10 Essential (primary) hypertension: Secondary | ICD-10-CM

## 2017-02-05 DIAGNOSIS — I2510 Atherosclerotic heart disease of native coronary artery without angina pectoris: Secondary | ICD-10-CM | POA: Diagnosis not present

## 2017-02-05 MED ORDER — LOSARTAN POTASSIUM 50 MG PO TABS: ORAL_TABLET | ORAL | 0 refills | Status: DC

## 2017-02-05 MED ORDER — LOSARTAN POTASSIUM 50 MG PO TABS: ORAL_TABLET | ORAL | 2 refills | Status: DC

## 2017-02-05 MED ORDER — HYDRALAZINE HCL 25 MG PO TABS: 25.0000 mg | ORAL_TABLET | Freq: Every day | ORAL | 1 refills | Status: DC

## 2017-02-05 NOTE — Progress Notes (Signed)
Patient ID: TRIPTON NED                 DOB: 01/09/1941                      MRN: [id number]     HPI:  [name] is a 76 y.o. male referred by Dr. Radford Pax to HTN clinic. PMH is significant for subclavian steal syndrome, PAD, DM, HTN, HLD and anxiety.  During most recent HTN follow up his evening medication was moved closer to bedtime to improve 24 hours BPcontrol.   Pt presents today for hypertension follow up. He denies any dizziness, lightheadedness, headaches, blurry vision, or falls. He report feeling "no different than before". Still experiencing very high BP early morning but improved during the day. No assessment by PCP yet to help with insomnia.  Current HTN meds:   metoprolol tartrate 50 mg BID (7am and 10pm)   losartan 50mg  Qam(7am)and 50 mg Qpm (10pm)  BP goal: <140/90 mmHg (given age)   Family History: The patient's family history includes COPD in his father; Hypertension in his mother; Lung cancer in his mother.  Social History: Smoke pipe 3-4 times per day. No alcohol.   Diet: Most from out. Eats mostly buffets. Drinks 2 cups of decaf coffee per day. Drinks lemonade otherwise. [employer] is go to [employer].   Exercise: Walks 2.25 miles 5 days a week.   Home BP readings:  18 morning readings; average 176/88 (pulse 51-65 bpm) 17 mid-day readings; average 146/74 (pulse 46-64 bpm) 17 evening readings; average 150/78 (pulse 45-72 bpm)  Wt Readings from Last 3 Encounters:  02/05/17 154 lb (69.9 kg)  12/05/16 153 lb 12.8 oz (69.8 kg)  07/31/16 158 lb 6.4 oz (71.8 kg)   BP Readings from Last 3 Encounters:  02/05/17 136/60  01/08/17 (!) 142/64  12/13/16 (!) 142/62   Pulse Readings from Last 3 Encounters:  02/05/17 (!) 54  01/08/17 (!) 52  12/13/16 (!) 50    Past Medical History:  Diagnosis Date  . Abscess of abdominal wall 11/28/2012  . Bladder cancer ([place])   . CAD (coronary artery disease)    Mild per cath in 2002/last nuclear stress per  note  2010  . Colonic diverticular abscess s/p perc drainage x2 11/10/2012  . Colonoscopy refused    Sister died from delayed Dx of post-colonoscopy   . Diabetes mellitus   . Diverticulitis 2025-4270   "all my life"  . Hyperlipidemia    Is on statin therapy  . Hypertension    LOV  Dr Aundra Dubin with clearance 05/08/11 and EKG  in [facility]   . Normal nuclear stress test 2010   EF 63% with no ischemia  . Skin cancer   . Subclavian steal syndrome    left; evaluated by Dr. Irish Lack; managed medically , no accurate B/p left arm    Current Outpatient Prescriptions on File Prior to Visit  Medication Sig Dispense Refill  . aspirin 81 MG tablet Take 81 mg by mouth daily.    . Cyanocobalamin (VITAMIN B 12 PO) Take 1 Dose by mouth as directed.     . fish oil-omega-3 fatty acids 1000 MG capsule Take 1 g by mouth daily.     Marland Kitchen glipiZIDE (GLUCOTROL) 5 MG tablet Take 2.5 mg by mouth daily before breakfast.     . metoprolol (LOPRESSOR) 50 MG tablet Take 1 tablet (50 mg total) by mouth 2 (two) times daily. 180 tablet 3  .  Multiple Vitamin (MULTIVITAMIN) tablet Take 1 tablet by mouth daily.    . orphenadrine (NORFLEX) 100 MG tablet Take 1 tablet by mouth 2 (two) times daily as needed.   1  . simvastatin (ZOCOR) 40 MG tablet Take 1 tablet (40 mg total) by mouth every evening. 90 tablet 3  . tamsulosin (FLOMAX) 0.4 MG CAPS capsule Take 0.4 mg by mouth daily.     No current facility-administered medications on file prior to visit.     Allergies  Allergen Reactions  . Dilaudid [Hydromorphone Hcl] Other (See Comments)    Very sedated with Dilaudid.  Tolerates fentanyl better  . Hydromorphone     Other reaction(s): Other (See Comments) Very sedated with Dilaudid.  Tolerates fentanyl better    Blood pressure 136/60, pulse (!) 54, weight 154 lb (69.9 kg), SpO2 98 %.  HTN (hypertension) Blood pressure today is at desired goal of <140/90. Patient denies problems or adverse effects to current therapy. Noted his  morning BP remains elevated with highest systolic BP at 078 (average 176/88). Beta-blocker dose already max due to HR ~60s. He denies headaches, chest pain or dizziness associated with elevated BP numbers. Risks of elevated BP discussed with patient at length during appointment. Mr Schwager still having insomnia that may be affecting his morning BP readings.  Will initiate hydralazine 25mg  at bedtime , continue BP monitoring twice daily and bring records to follow up appointment in 4 weeks.  Plan to contact cardiologist (DR Radford Pax) to assess need for sleep study.   [name] PharmD, BCPS, [place] [id number] 02/05/2017 10:35 AM

## 2017-02-05 NOTE — Assessment & Plan Note (Signed)
Blood pressure today is at desired goal of <140/90. Patient denies problems or adverse effects to current therapy. Noted his morning BP remains elevated with highest systolic BP at 829 (average 176/88). Beta-blocker dose already max due to HR ~60s. He denies headaches, chest pain or dizziness associated with elevated BP numbers. Risks of elevated BP discussed with patient at length during appointment. [name] still having insomnia that may be affecting his morning BP readings.  Will initiate hydralazine 25mg  at bedtime , continue BP monitoring twice daily and bring records to follow up appointment in 4 weeks.  Plan to contact cardiologist (DR Radford Pax) to assess need for sleep study.

## 2017-02-05 NOTE — Patient Instructions (Addendum)
Return for a follow up appointment in 4 weeks  Your blood pressure today is 136/60 pulse 54  Check your blood pressure at home daily (if able) and keep record of the readings.  Take your BP meds as follows: *START hydralazine 25mg  at bedtime* *CONTINUE all other medication as previously prescribed*  Bring all of your meds, your BP cuff and your record of home blood pressures to your next appointment.  Exercise as you're able, try to walk approximately 30 minutes per day.  Keep salt intake to a minimum, especially watch canned and prepared boxed foods.  Eat more fresh fruits and vegetables and fewer canned items.  Avoid eating in fast food restaurants.    HOW TO TAKE YOUR BLOOD PRESSURE: . Rest 5 minutes before taking your blood pressure. .  Don't smoke or drink caffeinated beverages for at least 30 minutes before. . Take your blood pressure before (not after) you eat. . Sit comfortably with your back supported and both feet on the floor (don't cross your legs). . Elevate your arm to heart level on a table or a desk. . Use the proper sized cuff. It should fit smoothly and snugly around your bare upper arm. There should be enough room to slip a fingertip under the cuff. The bottom edge of the cuff should be 1 inch above the crease of the elbow. . Ideally, take 3 measurements at one sitting and record the average.

## 2017-02-14 ENCOUNTER — Telehealth: Payer: Self-pay | Admitting: *Deleted

## 2017-02-14 DIAGNOSIS — N401 Enlarged prostate with lower urinary tract symptoms: Secondary | ICD-10-CM | POA: Diagnosis not present

## 2017-02-14 DIAGNOSIS — G4700 Insomnia, unspecified: Secondary | ICD-10-CM

## 2017-02-14 DIAGNOSIS — R400 Somnolence: Secondary | ICD-10-CM

## 2017-02-14 NOTE — Telephone Encounter (Signed)
-----   Message from Sueanne Margarita, MD sent at 02/13/2017  1:59 PM EDT ----- Regarding: RE: Sleep study Please order split night sleep study  Traci ----- Message ----- From: Harrington Challenger, [facility] Sent: 02/12/2017   9:11 PM To: Sueanne Margarita, MD Subject: Sleep study                                    Good evening Dr Radford Pax,  Mr Gerilyn Nestle f/u with HTN clinic at [facility] . His current regimen includes metoprolol, losartan and hydralazine. We still have plenty of room to adjust his antihypertension regimen but noted his SBP can get up to 336P systolic between 2am and 3am.   Patient also reports severe issues with sleeping patterns, insomnia, and day time sleepiness.  Will you consider a sleep study for this patient?  Raquel

## 2017-02-18 DIAGNOSIS — N401 Enlarged prostate with lower urinary tract symptoms: Secondary | ICD-10-CM | POA: Diagnosis not present

## 2017-02-18 DIAGNOSIS — Z8551 Personal history of malignant neoplasm of bladder: Secondary | ICD-10-CM | POA: Diagnosis not present

## 2017-02-18 DIAGNOSIS — R3912 Poor urinary stream: Secondary | ICD-10-CM | POA: Diagnosis not present

## 2017-02-22 NOTE — Telephone Encounter (Signed)
Informed patient of upcoming sleep study and patient understanding was verbalized. Patient understands her sleep study is scheduled for Sunday March 31 2017. Patient understands his sleep study will be done at [facility] sleep lab. Patient understands he will receive a sleep packet in a week or so. Patient understands to call if he does not receive the sleep packet in a timely manner. Patient has declined to have his sleep study on 02/20/2017.[name]

## 2017-03-04 NOTE — Progress Notes (Signed)
Patient ID: [name]                 DOB: [birth year]                      MRN: [id number]     HPI: [name] is a 76 y.o. male referred by Dr. Radford Pax to HTN clinic. PMH is significant for subclavian steal syndrome, PAD w/ claudication, DM, HTN, HLD and anxiety. Pt started on losartan by Dr. Radford Pax after calling in with elevated home BP readings. At initial HTN clinic, losartan dose was split to 25mg  BID to control BP throughout the day. This was later increased to 50mg  BID. At last HTN clinic visit, pt was noted to have very high morning BP readings which improve throughout the day, and pt reported very poor sleep so he was referred for a sleep study and hydralazine 25mg  QHS was started.  Pt presents to clinic "feeling alright I guess." Pt denies dizziness, falls, HA, and blurred vision. He has had no problems since starting the hydralazine. Pt cancelled his sleep study as he reports nobody ever discussed it with him and he notes his sleep is improving. He naps most days for about an hour. Pt declines the need for a sleep study at this time. Pt continues to walk most days of the week and eating out for dinner most nights.   Current HTN meds: metoprolol tartrate 50 mg BID, losartan 50 mg BID, hydralazine 25mg  QHS   BP goal: <140/90 mmHg (given age)  Family History: The patient's family history includes COPD in his father; Hypertension in his mother; Lung cancer in his mother.  Social History: Smoke pipe 3-4 times per day. No alcohol.   Diet: Most from out. Eats mostly buffets. Drinks 2 cups of decaf coffee per day. Drinks lemonade otherwise. Drinks 2 glasses of water/day.   Exercise: Walks 2.25 miles 5 days a week.   Home BP readings: morning readings - 140-150s/70s, evening readings - 130-150s/70s  Wt Readings from Last 3 Encounters:  02/05/17 154 lb (69.9 kg)  12/05/16 153 lb 12.8 oz (69.8 kg)  07/31/16 158 lb 6.4 oz (71.8 kg)   BP Readings from Last 3 Encounters:    02/05/17 136/60  01/08/17 (!) 142/64  12/13/16 (!) 142/62   Pulse Readings from Last 3 Encounters:  02/05/17 (!) 54  01/08/17 (!) 52  12/13/16 (!) 50    Renal function: CrCl cannot be calculated (Patient's most recent lab result is older than the maximum 21 days allowed.).  Past Medical History:  Diagnosis Date  . Abscess of abdominal wall 11/28/2012  . Bladder cancer ([place])   . CAD (coronary artery disease)    Mild per cath in 2002/last nuclear stress per note  2010  . Colonic diverticular abscess s/p perc drainage x2 11/10/2012  . Colonoscopy refused    Sister died from delayed Dx of post-colonoscopy   . Diabetes mellitus   . Diverticulitis [id number]   "all my life"  . Hyperlipidemia    Is on statin therapy  . Hypertension    LOV  Dr Aundra Dubin with clearance 05/08/11 and EKG  in [facility]   . Normal nuclear stress test 2010   EF 63% with no ischemia  . Skin cancer   . Subclavian steal syndrome    left; evaluated by Dr. Irish Lack; managed medically , no accurate B/p left arm    Current Outpatient Medications on File Prior to Visit  Medication  Sig Dispense Refill  . aspirin 81 MG tablet Take 81 mg by mouth daily.    . Cyanocobalamin (VITAMIN B 12 PO) Take 1 Dose by mouth as directed.     . fish oil-omega-3 fatty acids 1000 MG capsule Take 1 g by mouth daily.     Marland Kitchen glipiZIDE (GLUCOTROL) 5 MG tablet Take 2.5 mg by mouth daily before breakfast.     . hydrALAZINE (APRESOLINE) 25 MG tablet Take 1 tablet (25 mg total) by mouth at bedtime. 30 tablet 1  . losartan (COZAAR) 50 MG tablet TAKE 1 TABLET BY MOUTH IN THE A.M. AND TAKE 1 TABLET BY MOUTH AT BEDTIME 60 tablet 0  . metoprolol (LOPRESSOR) 50 MG tablet Take 1 tablet (50 mg total) by mouth 2 (two) times daily. 180 tablet 3  . Multiple Vitamin (MULTIVITAMIN) tablet Take 1 tablet by mouth daily.    . orphenadrine (NORFLEX) 100 MG tablet Take 1 tablet by mouth 2 (two) times daily as needed.   1  . simvastatin (ZOCOR) 40 MG tablet Take 1  tablet (40 mg total) by mouth every evening. 90 tablet 3  . tamsulosin (FLOMAX) 0.4 MG CAPS capsule Take 0.4 mg by mouth daily.     No current facility-administered medications on file prior to visit.     Allergies  Allergen Reactions  . Dilaudid [Hydromorphone Hcl] Other (See Comments)    Very sedated with Dilaudid.  Tolerates fentanyl better  . Hydromorphone     Other reaction(s): Other (See Comments) Very sedated with Dilaudid.  Tolerates fentanyl better     Assessment/Plan:  1. Hypertension - BP remains uncontrolled and above goal of <140/80 mmHg, though morning readings have improved significantly with addition of hydralazine at bedtime. Will continue losartan 50mg  BID and metoprolol 50mg  BID, and increase hydralazine to 25mg  TID to target better control throughout the day. Encouraged pt to consider sleep study and discuss this with Dr. Radford Pax at next office visit. Advised pt to continue walking and trying to avoid sodium in diet. Pt will return to clinic in 4 weeks for BP F/U.  Arrie Senate, PharmD PGY-2 Cardiology Pharmacy Resident Pager: [phone] 03/05/2017

## 2017-03-05 ENCOUNTER — Ambulatory Visit (INDEPENDENT_AMBULATORY_CARE_PROVIDER_SITE_OTHER): Payer: Medicare Other | Admitting: Pharmacist

## 2017-03-05 VITALS — BP 154/68 | HR 54

## 2017-03-05 DIAGNOSIS — I2510 Atherosclerotic heart disease of native coronary artery without angina pectoris: Secondary | ICD-10-CM | POA: Diagnosis not present

## 2017-03-05 DIAGNOSIS — I10 Essential (primary) hypertension: Secondary | ICD-10-CM | POA: Diagnosis not present

## 2017-03-05 MED ORDER — HYDRALAZINE HCL 25 MG PO TABS: 25.0000 mg | ORAL_TABLET | Freq: Three times a day (TID) | ORAL | 1 refills | Status: DC

## 2017-03-05 NOTE — Patient Instructions (Signed)
It was great to meet you today. Your blood pressure is improving!  Continue to take losartan 50mg  twice daily and metoprolol 50mg  twice daily.  Increase your hydralazine to 25mg  three times a day.  Return to clinic in 4 weeks.

## 2017-03-28 DIAGNOSIS — I10 Essential (primary) hypertension: Secondary | ICD-10-CM | POA: Diagnosis not present

## 2017-03-28 DIAGNOSIS — E785 Hyperlipidemia, unspecified: Secondary | ICD-10-CM | POA: Diagnosis not present

## 2017-03-28 DIAGNOSIS — J309 Allergic rhinitis, unspecified: Secondary | ICD-10-CM | POA: Diagnosis not present

## 2017-03-28 DIAGNOSIS — M791 Myalgia, unspecified site: Secondary | ICD-10-CM | POA: Diagnosis not present

## 2017-03-28 DIAGNOSIS — E119 Type 2 diabetes mellitus without complications: Secondary | ICD-10-CM | POA: Diagnosis not present

## 2017-03-28 DIAGNOSIS — Z79899 Other long term (current) drug therapy: Secondary | ICD-10-CM | POA: Diagnosis not present

## 2017-03-28 DIAGNOSIS — Z6820 Body mass index (BMI) 20.0-20.9, adult: Secondary | ICD-10-CM | POA: Diagnosis not present

## 2017-03-31 ENCOUNTER — Encounter (HOSPITAL_BASED_OUTPATIENT_CLINIC_OR_DEPARTMENT_OTHER): Payer: Medicare Other

## 2017-04-02 ENCOUNTER — Ambulatory Visit (INDEPENDENT_AMBULATORY_CARE_PROVIDER_SITE_OTHER): Payer: Medicare Other | Admitting: Pharmacist

## 2017-04-02 VITALS — BP 132/74 | HR 53

## 2017-04-02 DIAGNOSIS — I2510 Atherosclerotic heart disease of native coronary artery without angina pectoris: Secondary | ICD-10-CM

## 2017-04-02 DIAGNOSIS — I10 Essential (primary) hypertension: Secondary | ICD-10-CM | POA: Diagnosis not present

## 2017-04-02 NOTE — Progress Notes (Signed)
Patient ID: [name]                 DOB: [birth year]                      MRN: [id number]     HPI: [name] is a 76 y.o. male referred by Dr. Carlye Grippe HTN clinic.PMH is significant for subclavian steal syndrome, PAD w/ claudication, DM, HTN, HLDand anxiety. Pt started on losartan by Dr. Radford Pax after calling in with elevated home BP readings. At initial HTN clinic, losartan dose was split to 25mg  BID to control BP throughout the day. This was later increased to 50mg  BID. At another f/u visit hydralazine was initiated and later increased to TID dosing. Patient was referred for sleep study but has since cancelled this and declined reschedule at last visit.   Today patient reports he is "doing fine". Endorses 100% adherence to all HTN medications. Denies CP, dizziness, syncope, or fatigue. No significant changes to diet and patient reports he is unwilling to change dietary habits has he enjoys what he eats. Not interested in quitting smoking (pipe) at this time. Continues to get a good amount of exercise.   Reports one low BP reading of 95/60 mmHg where he "felt funny" right after starting hydralazine but has not had this recur since.   Took meds at ~7AM this morning. Takes blood pressure twice daily at home, once before meds and once in evening after meds.   Current HTN meds:metoprolol tartrate 50 mg BID, losartan 50 mg BID, hydralazine 25mg  TID  BP goal:<140/90 mmHg (given age)  Family History:The patient's family history includes COPD in his father; Hypertension in his mother; Lung cancer in his mother.  Social History:Smoke pipe 3-4 times per day. No alcohol.  Diet:Cereal and yogurt for breakfast. Sandwich for lunch + nuts+ cookies + PB cup. Eats mostly buffets for supper. Drinks 2 cups of decaf coffee per day. Drinks lemonade otherwise.Drinks 2 glasses of water/day.   Exercise:Walks 2.25 miles 5 days a week.  Home BP readings: morning readings  130s-160s/70s-80s mmHg (before meds), evening readings  120s-140s/60s-70s mmHg   Wt Readings from Last 3 Encounters:  02/05/17 154 lb (69.9 kg)  12/05/16 153 lb 12.8 oz (69.8 kg)  07/31/16 158 lb 6.4 oz (71.8 kg)   BP Readings from Last 3 Encounters:  04/02/17 132/74  03/05/17 (!) 154/68  02/05/17 136/60   Pulse Readings from Last 3 Encounters:  04/02/17 (!) 53  03/05/17 (!) 54  02/05/17 (!) 54    Renal function: CrCl cannot be calculated (Patient's most recent lab result is older than the maximum 21 days allowed.).  Past Medical History:  Diagnosis Date  . Abscess of abdominal wall 11/28/2012  . Bladder cancer ([place])   . CAD (coronary artery disease)    Mild per cath in 2002/last nuclear stress per note  2010  . Colonic diverticular abscess s/p perc drainage x2 11/10/2012  . Colonoscopy refused    Sister died from delayed Dx of post-colonoscopy   . Diabetes mellitus   . Diverticulitis [id number]   "all my life"  . Hyperlipidemia    Is on statin therapy  . Hypertension    LOV  Dr Aundra Dubin with clearance 05/08/11 and EKG  in [facility]   . Normal nuclear stress test 2010   EF 63% with no ischemia  . Skin cancer   . Subclavian steal syndrome    left; evaluated by Dr. Irish Lack; managed medically ,  no accurate B/p left arm    Current Outpatient Medications on File Prior to Visit  Medication Sig Dispense Refill  . aspirin 81 MG tablet Take 81 mg by mouth daily.    . Cyanocobalamin (VITAMIN B 12 PO) Take 1 Dose by mouth as directed.     . fish oil-omega-3 fatty acids 1000 MG capsule Take 1 g by mouth daily.     Marland Kitchen glipiZIDE (GLUCOTROL) 5 MG tablet Take 2.5 mg by mouth daily before breakfast.     . hydrALAZINE (APRESOLINE) 25 MG tablet Take 1 tablet (25 mg total) 3 (three) times daily by mouth. 270 tablet 1  . losartan (COZAAR) 50 MG tablet TAKE 1 TABLET BY MOUTH IN THE A.M. AND TAKE 1 TABLET BY MOUTH AT BEDTIME 60 tablet 0  . metoprolol (LOPRESSOR) 50 MG tablet Take 1 tablet (50 mg  total) by mouth 2 (two) times daily. 180 tablet 3  . Multiple Vitamin (MULTIVITAMIN) tablet Take 1 tablet by mouth daily.    . orphenadrine (NORFLEX) 100 MG tablet Take 1 tablet by mouth 2 (two) times daily as needed.   1  . simvastatin (ZOCOR) 40 MG tablet Take 1 tablet (40 mg total) by mouth every evening. 90 tablet 3  . tamsulosin (FLOMAX) 0.4 MG CAPS capsule Take 0.4 mg by mouth daily.     No current facility-administered medications on file prior to visit.     Allergies  Allergen Reactions  . Dilaudid [Hydromorphone Hcl] Other (See Comments)    Very sedated with Dilaudid.  Tolerates fentanyl better  . Hydromorphone     Other reaction(s): Other (See Comments) Very sedated with Dilaudid.  Tolerates fentanyl better     Assessment/Plan:  1. Hypertension - BP 132/74 mmHg today which is below goal of <140/80 mmHg after increase of hydralazine to TID dosing. Home readings in the evenings (after meds) largely controlled. Very adherent to medications and tolerating medication well despite HRs in the 50s and occasionally 40s. Will continue current regimen: losartan 50 mg BID and metoprolol tartrate 50 mg BID, and hydralazine to 25 mg TID. Discussed importance of smoking cessation, sodium reduction, and continued exercise. Follow up with Dr. Radford Pax as scheduled.  Carlean Jews, Pharm.D., BCPS PGY2 Ambulatory Care Pharmacy Resident Phone: [phone]

## 2017-04-02 NOTE — Patient Instructions (Addendum)
Thanks for coming to see [place] today.   Your blood pressure was good today and looks good at home after you have taken your medications.   No changes to meds today, continue metoprolol tartrate 50 mg twice a day, losartan 50 mg twice a day, hydralazine 25mg  three times a day.   Follow up with Dr. Radford Pax as usual.

## 2017-06-10 ENCOUNTER — Other Ambulatory Visit: Payer: Self-pay | Admitting: Cardiology

## 2017-06-10 DIAGNOSIS — S20219A Contusion of unspecified front wall of thorax, initial encounter: Secondary | ICD-10-CM | POA: Diagnosis not present

## 2017-06-18 ENCOUNTER — Other Ambulatory Visit: Payer: Self-pay | Admitting: Cardiology

## 2017-06-27 DIAGNOSIS — Z79899 Other long term (current) drug therapy: Secondary | ICD-10-CM | POA: Diagnosis not present

## 2017-06-27 DIAGNOSIS — Z6820 Body mass index (BMI) 20.0-20.9, adult: Secondary | ICD-10-CM | POA: Diagnosis not present

## 2017-06-27 DIAGNOSIS — I10 Essential (primary) hypertension: Secondary | ICD-10-CM | POA: Diagnosis not present

## 2017-06-27 DIAGNOSIS — E785 Hyperlipidemia, unspecified: Secondary | ICD-10-CM | POA: Diagnosis not present

## 2017-06-27 DIAGNOSIS — E119 Type 2 diabetes mellitus without complications: Secondary | ICD-10-CM | POA: Diagnosis not present

## 2017-06-27 DIAGNOSIS — I2510 Atherosclerotic heart disease of native coronary artery without angina pectoris: Secondary | ICD-10-CM | POA: Diagnosis not present

## 2017-07-15 DIAGNOSIS — I2510 Atherosclerotic heart disease of native coronary artery without angina pectoris: Secondary | ICD-10-CM | POA: Diagnosis not present

## 2017-07-15 DIAGNOSIS — Z79899 Other long term (current) drug therapy: Secondary | ICD-10-CM | POA: Diagnosis not present

## 2017-07-15 DIAGNOSIS — E119 Type 2 diabetes mellitus without complications: Secondary | ICD-10-CM | POA: Diagnosis not present

## 2017-07-15 DIAGNOSIS — R0789 Other chest pain: Secondary | ICD-10-CM | POA: Diagnosis not present

## 2017-07-15 DIAGNOSIS — I10 Essential (primary) hypertension: Secondary | ICD-10-CM | POA: Diagnosis not present

## 2017-07-15 DIAGNOSIS — F17210 Nicotine dependence, cigarettes, uncomplicated: Secondary | ICD-10-CM | POA: Diagnosis not present

## 2017-07-15 DIAGNOSIS — J189 Pneumonia, unspecified organism: Secondary | ICD-10-CM | POA: Diagnosis not present

## 2017-07-15 DIAGNOSIS — Z7982 Long term (current) use of aspirin: Secondary | ICD-10-CM | POA: Diagnosis not present

## 2017-07-15 DIAGNOSIS — J181 Lobar pneumonia, unspecified organism: Secondary | ICD-10-CM | POA: Diagnosis not present

## 2017-08-12 ENCOUNTER — Ambulatory Visit (INDEPENDENT_AMBULATORY_CARE_PROVIDER_SITE_OTHER): Payer: Medicare Other | Admitting: Cardiology

## 2017-08-12 ENCOUNTER — Encounter: Payer: Self-pay | Admitting: Cardiology

## 2017-08-12 VITALS — BP 126/60 | HR 54 | Ht 74.0 in | Wt 154.0 lb

## 2017-08-12 DIAGNOSIS — I10 Essential (primary) hypertension: Secondary | ICD-10-CM | POA: Diagnosis not present

## 2017-08-12 DIAGNOSIS — I739 Peripheral vascular disease, unspecified: Secondary | ICD-10-CM | POA: Diagnosis not present

## 2017-08-12 DIAGNOSIS — R001 Bradycardia, unspecified: Secondary | ICD-10-CM

## 2017-08-12 DIAGNOSIS — E7800 Pure hypercholesterolemia, unspecified: Secondary | ICD-10-CM

## 2017-08-12 DIAGNOSIS — G458 Other transient cerebral ischemic attacks and related syndromes: Secondary | ICD-10-CM | POA: Diagnosis not present

## 2017-08-12 HISTORY — DX: Peripheral vascular disease, unspecified: I73.9

## 2017-08-12 MED ORDER — HYDRALAZINE HCL 25 MG PO TABS: 25.0000 mg | ORAL_TABLET | Freq: Three times a day (TID) | ORAL | 3 refills | Status: DC

## 2017-08-12 MED ORDER — LOSARTAN POTASSIUM 50 MG PO TABS: ORAL_TABLET | ORAL | 3 refills | Status: DC

## 2017-08-12 NOTE — Progress Notes (Signed)
Cardiology Office Note:    Date:  08/12/2017   ID:  [name], DOB January 03, 1941, MRN [id number]  PCP:  Antonio Limbo, PA-C  Cardiologist:  No primary care provider on file.     Referring MD: Antonio Limbo, PA-C   Chief Complaint  Patient presents with  . Follow-up    PVD, hyperlipidemia,HTN    History of Present Illness:    [name] is a 77 y.o. male with a hx of subclavian steal syndrome, PAD, DM, HTN, hyperlipidemia and diabetes. He had a cath in 2002 with minimal disease. He has left subclavian stenosis with evidence for steal by doppler imaging, last dopplers done in 9/15. Peripheral arterial dopplers in 9/16 showed bilateral L>R SFA disease. He has had significant claudication. He was seen by [name] who recommended angiography, but he did not want to have the procedure done. He was started on cilostazol but this caused abdominal pain.   he is here today for followup and is doing well.  He denies any chest pain or pressure, SOB, DOE, PND, orthopnea, LE edema, dizziness, palpitations or syncope. He is compliant with his meds and is tolerating meds with no SE.  He says that he has been walking 2 months daily and really has not had any problems with claudication recently.  Denies any numbness or pain in his upper extremities.  Past Medical History:  Diagnosis Date  . Abscess of abdominal wall 11/28/2012  . Bladder cancer ([place])   . CAD (coronary artery disease)    Mild per cath in 2002/last nuclear stress per note  2010  . Colonic diverticular abscess s/p perc drainage x2 11/10/2012  . Colonoscopy refused    Sister died from delayed Dx of post-colonoscopy   . Diabetes mellitus   . Diverticulitis [id number]   "all my life"  . Hyperlipidemia    Is on statin therapy  . Hypertension    LOV  [name] with clearance 05/08/11 and EKG  in [facility]   . Normal nuclear stress test 2010   EF 63% with no ischemia  . PVD (peripheral vascular disease) ([place]) 08/12/2017  . Skin  cancer   . Subclavian steal syndrome    left; evaluated by Antonio. Irish Lack; managed medically , no accurate B/p left arm    Past Surgical History:  Procedure Laterality Date  . APPENDECTOMY    . CARDIAC CATHETERIZATION  2002   He had a which showed mild coronary atherosclerosis and normal left ventricular function.  Marland Kitchen CATARACT EXTRACTION, BILATERAL Bilateral 01-23-13   11'13  . CYSTOSCOPY W/ URETERAL STENT PLACEMENT Bilateral 01/29/2013   Procedure: CYSTOSCOPY WITH BILATERAL RETROGRADE PYELOGRAM/BILATERAL URETERAL STENT PLACEMENT;  Surgeon: Fredricka Bonine, MD;  Location: [facility];  Service: Urology;  Laterality: Bilateral;  . CYSTOSCOPY WITH INJECTION     cystoscopy with BCG injections  . FISTULOTOMY N/A 01/29/2013   Procedure: repair colovesical fustula ;  Surgeon: Adin Hector, MD;  Location: [facility];  Service: General;  Laterality: N/A;  . LAPAROSCOPIC SIGMOID COLECTOMY N/A 01/29/2013   Procedure: SPLENIC FLEXOR MOBILIZATION,LAPAROSCOPIC SIGMOID COLECTOMY, LYSIS OF ADHESIONS, OMENTOPEXY, RIGID PROCTOSCOPY;  Surgeon: Adin Hector, MD;  Location: [facility];  Service: General;  Laterality: N/A;  . PERITONEAL WOUND DRAINAGE  01-23-13   implant of abdomianl drain  . ROTATOR CUFF REPAIR     right  . TONSILLECTOMY    . TRANSURETHRAL RESECTION OF PROSTATE  05/17/2011   Procedure: TRANSURETHRAL RESECTION OF THE PROSTATE WITH GYRUS INSTRUMENTS;  Surgeon: Malka So, MD;  Location: [facility];  Service: Urology;  Laterality: N/A;    Current Medications: Current Meds  Medication Sig  . aspirin 81 MG tablet Take 81 mg by mouth daily.  . Cyanocobalamin (VITAMIN B 12 PO) Take 1 Dose by mouth as directed.   . fish oil-omega-3 fatty acids 1000 MG capsule Take 1 g by mouth daily.   Marland Kitchen glipiZIDE (GLUCOTROL) 5 MG tablet Take 2.5 mg by mouth daily before breakfast.   . hydrALAZINE (APRESOLINE) 25 MG tablet Take 1 tablet (25 mg total) by mouth 3 (three) times daily. DUE FOR FOLLOW UP. PLEASE CALL  AND SCHEDULE FOLLOW UP. [phone]  . losartan (COZAAR) 50 MG tablet TAKE 1 TABLET BY MOUTH IN THE A.M. AND TAKE 1 TABLET BY MOUTH AT BEDTIME  . metoprolol tartrate (LOPRESSOR) 50 MG tablet TAKE 1 TABLET BY MOUTH TWO  TIMES DAILY  . Multiple Vitamin (MULTIVITAMIN) tablet Take 1 tablet by mouth daily.  . orphenadrine (NORFLEX) 100 MG tablet Take 1 tablet by mouth 2 (two) times daily as needed.   . simvastatin (ZOCOR) 40 MG tablet TAKE 1 TABLET BY MOUTH  EVERY EVENING  . tamsulosin (FLOMAX) 0.4 MG CAPS capsule Take 0.4 mg by mouth daily.     Allergies:   Dilaudid [hydromorphone hcl] and Hydromorphone   Social History   Socioeconomic History  . Marital status: Married    Spouse name: Not on file  . Number of children: Not on file  . Years of education: Not on file  . Highest education level: Not on file  Occupational History  . Not on file  Social Needs  . Financial resource strain: Not on file  . Food insecurity:    Worry: Not on file    Inability: Not on file  . Transportation needs:    Medical: Not on file    Non-medical: Not on file  Tobacco Use  . Smoking status: Current Every Day Smoker    Years: 55.00    Types: Pipe    Last attempt to quit: 05/14/1955    Years since quitting: 62.2  . Smokeless tobacco: Former Network engineer and Sexual Activity  . Alcohol use: No  . Drug use: No  . Sexual activity: Yes  Lifestyle  . Physical activity:    Days per week: Not on file    Minutes per session: Not on file  . Stress: Not on file  Relationships  . Social connections:    Talks on phone: Not on file    Gets together: Not on file    Attends religious service: Not on file    Active member of club or organization: Not on file    Attends meetings of clubs or organizations: Not on file    Relationship status: Not on file  Other Topics Concern  . Not on file  Social History Narrative  . Not on file     Family History: The patient's family history includes COPD in his  father; Hypertension in his mother; Lung cancer in his mother.  ROS:   Please see the history of present illness.    ROS  All other systems reviewed and negative.  30 1 PM  EKGs/Labs/Other Studies Reviewed:    The following studies were reviewed today: none  EKG:  EKG is  ordered today.  The ekg ordered today demonstrates branch block and no acute ST-T wave abdomen  Recent Labs: 01/08/2017: BUN 15; Creatinine, Ser 0.79; Potassium 4.5; Sodium  133 2726  Recent Lipid Panel    Component Value Date/Time   CHOL 110 (L) 12/31/2014 1638   TRIG 152 (H) 12/31/2014 1638   HDL 41 12/31/2014 1638   CHOLHDL 2.7 12/31/2014 1638   VLDL 30 12/31/2014 1638   LDLCALC 39 12/31/2014 1638    Physical Exam:    VS:  BP 126/60   Pulse (!) 54   Ht 6\' 2"  (1.88 m)   Wt 154 lb (69.9 kg)   BMI 19.77 kg/m     Wt Readings from Last 3 Encounters:  08/12/17 154 lb (69.9 kg)  02/05/17 154 lb (69.9 kg)  12/05/16 153 lb 12.8 oz (69.8 kg)     GEN:  Well nourished, well developed in no acute distress HEENT: Normal NECK: No JVD; No carotid bruits LYMPHATICS: No lymphadenopathy CARDIAC: RRR, no murmurs, rubs, gallops RESPIRATORY:  Clear to auscultation without rales, wheezing or rhonchi  ABDOMEN: Soft, non-tender, non-distended MUSCULOSKELETAL:  No edema; No deformity  SKIN: Warm and dry NEUROLOGIC:  Alert and oriented x 3 PSYCHIATRIC:  Normal affect   ASSESSMENT:    1. Subclavian steal syndrome   2. Essential hypertension   3. Pure hypercholesterolemia   4. PVD (peripheral vascular disease) ([place])   5. Bradycardia    PLAN:    In order of problems listed above:  1.  Subclavian steel syndrome - he denies any arm numbness or claudication.  I will repeat carotid Dopplers today since he has left carotid bruit.  2.  HTN - BP is well controlled on exam today.  Continue on Lopressor 50 mg and hydralazine 25 mg 3 times daily.  3.  Hyperlipidemia with LDL goal < 70.  He will continue simvastatin  40 daily.  LDL is 42 on 06/27/2017.  4.  PVD - followed by Antonio. Fletcher Dawson - refused angiography.  He will continue aspirin 81 mg daily, statin therapy.  He is walking 2 miles daily with leg cramps.  5.  Bradycardia - his heart rate is 54bpm on EKG but he is asymptomatic.  This is drug induced from BB.   Medication Adjustments/Labs and Tests Ordered: Current medicines are reviewed at length with the patient today.  Concerns regarding medicines are outlined above.  No orders of the defined types were placed in this encounter.  No orders of the defined types were placed in this encounter.   Signed, Fransico Him, MD  08/12/2017 8:39 AM    [place]

## 2017-08-12 NOTE — Patient Instructions (Signed)
Medication Instructions:  Your physician recommends that you continue on your current medications as directed. Please refer to the Current Medication list given to you today.  Labwork: None ordered   Testing/Procedures: Your physician has requested that you have a carotid duplex. This test is an ultrasound of the carotid arteries in your neck. It looks at blood flow through these arteries that supply the brain with blood. Allow one hour for this exam. There are no restrictions or special instructions.  Follow-Up: Your physician wants you to follow-up in: 1 year with Dr.Turner. You will receive a reminder letter in the mail two months in advance. If you don't receive a letter, please call our office to schedule the follow-up appointment.  Any Other Special Instructions Will Be Listed Below (If Applicable).    Thank you for choosing [facility], RN  [phone]  If you need a refill on your cardiac medications before your next appointment, please call your pharmacy.

## 2017-08-14 ENCOUNTER — Ambulatory Visit (HOSPITAL_COMMUNITY)
Admission: RE | Admit: 2017-08-14 | Discharge: 2017-08-14 | Disposition: A | Discharge status: Home / Self Care | Payer: Medicare Other | Source: Ambulatory Visit | Attending: Internal Medicine | Admitting: Internal Medicine

## 2017-08-14 DIAGNOSIS — G458 Other transient cerebral ischemic attacks and related syndromes: Secondary | ICD-10-CM | POA: Insufficient documentation

## 2017-08-19 ENCOUNTER — Encounter: Payer: Self-pay | Admitting: Cardiology

## 2017-08-19 DIAGNOSIS — I6529 Occlusion and stenosis of unspecified carotid artery: Secondary | ICD-10-CM | POA: Insufficient documentation

## 2017-08-30 DIAGNOSIS — E119 Type 2 diabetes mellitus without complications: Secondary | ICD-10-CM | POA: Diagnosis not present

## 2017-08-30 DIAGNOSIS — Z961 Presence of intraocular lens: Secondary | ICD-10-CM | POA: Diagnosis not present

## 2017-08-30 DIAGNOSIS — Z7984 Long term (current) use of oral hypoglycemic drugs: Secondary | ICD-10-CM | POA: Diagnosis not present

## 2017-10-03 DIAGNOSIS — L570 Actinic keratosis: Secondary | ICD-10-CM | POA: Diagnosis not present

## 2017-10-03 DIAGNOSIS — D0439 Carcinoma in situ of skin of other parts of face: Secondary | ICD-10-CM | POA: Diagnosis not present

## 2017-10-03 DIAGNOSIS — D485 Neoplasm of uncertain behavior of skin: Secondary | ICD-10-CM | POA: Diagnosis not present

## 2017-10-03 DIAGNOSIS — Z85828 Personal history of other malignant neoplasm of skin: Secondary | ICD-10-CM | POA: Diagnosis not present

## 2017-10-03 DIAGNOSIS — L821 Other seborrheic keratosis: Secondary | ICD-10-CM | POA: Diagnosis not present

## 2017-10-03 DIAGNOSIS — D2372 Other benign neoplasm of skin of left lower limb, including hip: Secondary | ICD-10-CM | POA: Diagnosis not present

## 2017-10-03 DIAGNOSIS — B078 Other viral warts: Secondary | ICD-10-CM | POA: Diagnosis not present

## 2017-10-04 DIAGNOSIS — Z9181 History of falling: Secondary | ICD-10-CM | POA: Diagnosis not present

## 2017-10-04 DIAGNOSIS — E785 Hyperlipidemia, unspecified: Secondary | ICD-10-CM | POA: Diagnosis not present

## 2017-10-04 DIAGNOSIS — I10 Essential (primary) hypertension: Secondary | ICD-10-CM | POA: Diagnosis not present

## 2017-10-04 DIAGNOSIS — Z0000 Encounter for general adult medical examination without abnormal findings: Secondary | ICD-10-CM | POA: Diagnosis not present

## 2017-10-04 DIAGNOSIS — Z1339 Encounter for screening examination for other mental health and behavioral disorders: Secondary | ICD-10-CM | POA: Diagnosis not present

## 2017-10-04 DIAGNOSIS — Z79899 Other long term (current) drug therapy: Secondary | ICD-10-CM | POA: Diagnosis not present

## 2017-10-04 DIAGNOSIS — Z1331 Encounter for screening for depression: Secondary | ICD-10-CM | POA: Diagnosis not present

## 2017-10-04 DIAGNOSIS — E119 Type 2 diabetes mellitus without complications: Secondary | ICD-10-CM | POA: Diagnosis not present

## 2017-10-04 DIAGNOSIS — M791 Myalgia, unspecified site: Secondary | ICD-10-CM | POA: Diagnosis not present

## 2018-01-07 DIAGNOSIS — Z79899 Other long term (current) drug therapy: Secondary | ICD-10-CM | POA: Diagnosis not present

## 2018-01-07 DIAGNOSIS — Z23 Encounter for immunization: Secondary | ICD-10-CM | POA: Diagnosis not present

## 2018-01-07 DIAGNOSIS — G4700 Insomnia, unspecified: Secondary | ICD-10-CM | POA: Diagnosis not present

## 2018-01-07 DIAGNOSIS — I10 Essential (primary) hypertension: Secondary | ICD-10-CM | POA: Diagnosis not present

## 2018-01-07 DIAGNOSIS — E119 Type 2 diabetes mellitus without complications: Secondary | ICD-10-CM | POA: Diagnosis not present

## 2018-01-07 DIAGNOSIS — E785 Hyperlipidemia, unspecified: Secondary | ICD-10-CM | POA: Diagnosis not present

## 2018-02-28 ENCOUNTER — Other Ambulatory Visit: Payer: Self-pay | Admitting: Cardiology

## 2018-03-03 DIAGNOSIS — N401 Enlarged prostate with lower urinary tract symptoms: Secondary | ICD-10-CM | POA: Diagnosis not present

## 2018-03-10 DIAGNOSIS — Z8551 Personal history of malignant neoplasm of bladder: Secondary | ICD-10-CM | POA: Diagnosis not present

## 2018-03-10 DIAGNOSIS — N401 Enlarged prostate with lower urinary tract symptoms: Secondary | ICD-10-CM | POA: Diagnosis not present

## 2018-03-10 DIAGNOSIS — R3912 Poor urinary stream: Secondary | ICD-10-CM | POA: Diagnosis not present

## 2018-03-31 DIAGNOSIS — Z79899 Other long term (current) drug therapy: Secondary | ICD-10-CM | POA: Diagnosis not present

## 2018-03-31 DIAGNOSIS — E119 Type 2 diabetes mellitus without complications: Secondary | ICD-10-CM | POA: Diagnosis not present

## 2018-03-31 DIAGNOSIS — E785 Hyperlipidemia, unspecified: Secondary | ICD-10-CM | POA: Diagnosis not present

## 2018-03-31 DIAGNOSIS — I10 Essential (primary) hypertension: Secondary | ICD-10-CM | POA: Diagnosis not present

## 2018-06-30 DIAGNOSIS — E118 Type 2 diabetes mellitus with unspecified complications: Secondary | ICD-10-CM | POA: Diagnosis not present

## 2018-06-30 DIAGNOSIS — E785 Hyperlipidemia, unspecified: Secondary | ICD-10-CM | POA: Diagnosis not present

## 2018-06-30 DIAGNOSIS — I119 Hypertensive heart disease without heart failure: Secondary | ICD-10-CM | POA: Diagnosis not present

## 2018-06-30 DIAGNOSIS — Z79899 Other long term (current) drug therapy: Secondary | ICD-10-CM | POA: Diagnosis not present

## 2018-06-30 DIAGNOSIS — I739 Peripheral vascular disease, unspecified: Secondary | ICD-10-CM | POA: Diagnosis not present

## 2018-08-07 ENCOUNTER — Telehealth: Payer: Self-pay

## 2018-08-07 NOTE — Telephone Encounter (Signed)

## 2018-08-11 NOTE — Progress Notes (Signed)
Virtual Visit via Telephone Note   This visit type was conducted due to national recommendations for restrictions regarding the COVID-19 Pandemic (e.g. social distancing) in an effort to limit this patient's exposure and mitigate transmission in our community.  Due to his co-morbid illnesses, this patient is at least at moderate risk for complications without adequate follow up.  This format is felt to be most appropriate for this patient at this time.  The patient did not have access to video technology/had technical difficulties with video requiring transitioning to audio format only (telephone).  All issues noted in this document were discussed and addressed.  No physical exam could be performed with this format.  Please refer to the patient's chart for his  consent to telehealth for [facility]   Evaluation Performed:  Follow-up visit  This visit type was conducted due to national recommendations for restrictions regarding the COVID-19 Pandemic (e.g. social distancing).  This format is felt to be most appropriate for this patient at this time.  All issues noted in this document were discussed and addressed.  No physical exam was performed (except for noted visual exam findings with Video Visits).  Please refer to the patient's chart (MyChart message for video visits and phone note for telephone visits) for the patient's consent to telehealth for [facility].  Date:  08/12/2018   ID:  [name], DOB 05-20-1940, MRN [id number]  Patient Location:  Home  Provider location:   [place]  PCP:  Janine Limbo, PA-C  Cardiologist:  Fransico Him, MD Electrophysiologist:  None   Chief Complaint:  PVCs, hyperlipidemia and HTN  History of Present Illness:    [name] is a 78 y.o. male who presents via audio/video conferencing for a telehealth visit today.    [name] is a 78y.o. male with a hx of subclavian steal syndrome, PAD,DM, HTN, hyperlipidemiaand diabetes. He  had a cath in 2002 with minimal disease. He has left subclavian stenosis with evidence for steal by doppler imaging, last dopplers done in 9/15. Peripheral arterial dopplers in 9/16 showed bilateral L>R SFA disease. He has had significant claudication. He was seen by Dr Fletcher Anon who recommended angiography, but he did not want to have the procedure done. He was started on cilostazol but this caused abdominal pain.  He is here today for followup and is doing well.  He denies any chest pain or pressure, SOB, DOE, PND, orthopnea, LE edema, dizziness, palpitations or syncope. He is compliant with his meds and is tolerating meds with no SE.    The patient  hdoes notave symptoms concerning for COVID-19 infection (fever, chills, cough, or new shortness of breath).    Prior CV studies:   The following studies were reviewed today:  none  Past Medical History:  Diagnosis Date  . Abscess of abdominal wall 11/28/2012  . Bladder cancer ([place])   . CAD (coronary artery disease)    Mild per cath in 2002/last nuclear stress per note  2010  . Carotid artery stenosis    Stable bilateral mild carotid artery disease with 1-39% stenosis and normal right vertebral flow but retrograde left vertebral flow c/w known subclavian stenosis  . Colonic diverticular abscess s/p perc drainage x2 11/10/2012  . Colonoscopy refused    Sister died from delayed Dx of post-colonoscopy   . Diabetes mellitus   . Diverticulitis [id number]   "all my life"  . Hyperlipidemia    Is on statin therapy  . Hypertension  LOV  Dr Aundra Dubin with clearance 05/08/11 and EKG  in EPIC   . Normal nuclear stress test 2010   EF 63% with no ischemia  . PVD (peripheral vascular disease) ([place]) 08/12/2017  . Skin cancer   . Subclavian steal syndrome    left; evaluated by Dr. Irish Lack; managed medically , no accurate B/p left arm. Dopplers 08/2017 - Stable bilateral mild carotid artery disease with 1-39% stenosis and normal right vertebral flow but  retrograde left vertebral flow c/w known subclavian stenosis   Past Surgical History:  Procedure Laterality Date  . APPENDECTOMY    . CARDIAC CATHETERIZATION  2002   He had a which showed mild coronary atherosclerosis and normal left ventricular function.  Marland Kitchen CATARACT EXTRACTION, BILATERAL Bilateral 01-23-13   11'13  . CYSTOSCOPY W/ URETERAL STENT PLACEMENT Bilateral 01/29/2013   Procedure: CYSTOSCOPY WITH BILATERAL RETROGRADE PYELOGRAM/BILATERAL URETERAL STENT PLACEMENT;  Surgeon: Fredricka Bonine, MD;  Location: [facility];  Service: Urology;  Laterality: Bilateral;  . CYSTOSCOPY WITH INJECTION     cystoscopy with BCG injections  . FISTULOTOMY N/A 01/29/2013   Procedure: repair colovesical fustula ;  Surgeon: Adin Hector, MD;  Location: [facility];  Service: General;  Laterality: N/A;  . LAPAROSCOPIC SIGMOID COLECTOMY N/A 01/29/2013   Procedure: SPLENIC FLEXOR MOBILIZATION,LAPAROSCOPIC SIGMOID COLECTOMY, LYSIS OF ADHESIONS, OMENTOPEXY, RIGID PROCTOSCOPY;  Surgeon: Adin Hector, MD;  Location: [facility];  Service: General;  Laterality: N/A;  . PERITONEAL WOUND DRAINAGE  01-23-13   implant of abdomianl drain  . ROTATOR CUFF REPAIR     right  . TONSILLECTOMY    . TRANSURETHRAL RESECTION OF PROSTATE  05/17/2011   Procedure: TRANSURETHRAL RESECTION OF THE PROSTATE WITH GYRUS INSTRUMENTS;  Surgeon: Malka So, MD;  Location: [facility];  Service: Urology;  Laterality: N/A;     Current Meds  Medication Sig  . aspirin 81 MG tablet Take 81 mg by mouth daily.  . Cyanocobalamin (VITAMIN B 12 PO) Take 1 Dose by mouth as directed.   . fish oil-omega-3 fatty acids 1000 MG capsule Take 1 g by mouth daily.   Marland Kitchen glipiZIDE (GLUCOTROL) 5 MG tablet Take 2.5 mg by mouth daily before breakfast.   . hydrALAZINE (APRESOLINE) 25 MG tablet Take 1 tablet (25 mg total) by mouth 3 (three) times daily.  Marland Kitchen losartan (COZAAR) 50 MG tablet TAKE 1 TABLET BY MOUTH IN THE A.M. AND TAKE 1 TABLET BY MOUTH AT BEDTIME  .  metoprolol tartrate (LOPRESSOR) 50 MG tablet Take 1 tablet (50 mg total) by mouth 2 (two) times daily.  . Multiple Vitamin (MULTIVITAMIN) tablet Take 1 tablet by mouth daily.  . orphenadrine (NORFLEX) 100 MG tablet Take 1 tablet by mouth 2 (two) times daily as needed.   . simvastatin (ZOCOR) 40 MG tablet Take 1 tablet (40 mg total) by mouth every evening.  . tamsulosin (FLOMAX) 0.4 MG CAPS capsule Take 0.4 mg by mouth daily.  . [DISCONTINUED] hydrALAZINE (APRESOLINE) 25 MG tablet TAKE 1 TABLET BY MOUTH 3  TIMES A DAY  . [DISCONTINUED] losartan (COZAAR) 50 MG tablet TAKE 1 TABLET BY MOUTH IN THE A.M. AND TAKE 1 TABLET BY MOUTH AT BEDTIME  . [DISCONTINUED] metoprolol tartrate (LOPRESSOR) 50 MG tablet TAKE 1 TABLET BY MOUTH TWO  TIMES DAILY  . [DISCONTINUED] simvastatin (ZOCOR) 40 MG tablet TAKE 1 TABLET BY MOUTH  EVERY EVENING     Allergies:   Dilaudid [hydromorphone hcl] and Hydromorphone   Social History   Tobacco Use  .  Smoking status: Current Every Day Smoker    Years: 55.00    Types: Pipe    Last attempt to quit: 05/14/1955    Years since quitting: 63.2  . Smokeless tobacco: Former Network engineer Use Topics  . Alcohol use: No  . Drug use: No     Family Hx: The patient's family history includes COPD in his father; Hypertension in his mother; Lung cancer in his mother.  ROS:   Please see the history of present illness.     All other systems reviewed and are negative.   Labs/Other Tests and Data Reviewed:    Recent Labs: No results found for requested labs within last 8760 hours.   Recent Lipid Panel Lab Results  Component Value Date/Time   CHOL 110 (L) 12/31/2014 04:38 PM   TRIG 152 (H) 12/31/2014 04:38 PM   HDL 41 12/31/2014 04:38 PM   CHOLHDL 2.7 12/31/2014 04:38 PM   LDLCALC 39 12/31/2014 04:38 PM    Wt Readings from Last 3 Encounters:  08/12/18 158 lb (71.7 kg)  08/12/17 154 lb (69.9 kg)  02/05/17 154 lb (69.9 kg)     Objective:    Vital Signs:  BP (!)  168/84   Pulse 61   Ht 6\' 2"  (1.88 m)   Wt 158 lb (71.7 kg) Comment: patient stated his weight  BMI 20.29 kg/m    Due to technical difficulties with video the video visit was changed to telephone visit and no PE done  ASSESSMENT & PLAN:    1.  Carotid artery stenosis with Subclavian steel syndrome - He has not had any arm numbness or claudication symptoms.  His last dopplers 08/2017 showed 1-39% bilateral stenosis and < 50% stenosis in the CCA bilaterally and stenotic left subclavian artery with steal.  He will continue on ASA and statin.  I will repeat dopplers in 1 year.   2.  HTN  -he took his BP first thing before his BP meds.  He will continue on Lopressor 50 mg twice daily, Losartan 50mg  daily and hydralazine 25 mg 3 times daily.  I have asked him to check his BP twice daily for a week and call with the results.  3.  Hyperlipidemia -LDL goal is less than 70.  His last LDL was 41 on 06/2018.  He will continue on Simvastatin 40mg  daily.   4.   PVD - Peripheral arterial dopplers in 9/16 showed bilateral L>R SFA disease. He has had significant claudication. He was seen by Dr Fletcher Anon who recommended angiography, but he did not want to have the procedure done.  He was tried on Cilastazol but did no tolerate due to abdominal pain.  He continues with his walking program walking 2 miles daily and has not had any claudication symptoms.  He will continue on statin therapy.   5.  Bradycardia medically -this is secondary to his beta-blocker.  He is completely asymptomatic and HR is usually in the 50's after his meds.   6.  COVID-19 Education:  The signs and symptoms of COVID-19 were discussed with the patient and how to seek care for testing (follow up with PCP or arrange E-visit).  The importance of social distancing was discussed today.  Patient Risk:   After full review of this patient's clinical status, I feel that they are at least moderate risk at this time.  Time:   Today, I have spent 20  minutes directly with the patient on telephone discussing medical problems including HTN,  PVD, bradycardia.  We also reviewed the symptoms of COVID 19 and the ways to protect against contracting the virus with telehealth technology.  I spent an additional 5 minutes reviewing patient's chart including carotid dopplers.  Medication Adjustments/Labs and Tests Ordered: Current medicines are reviewed at length with the patient today.  Concerns regarding medicines are outlined above.  Tests Ordered: No orders of the defined types were placed in this encounter.  Medication Changes: Meds ordered this encounter  Medications  . losartan (COZAAR) 50 MG tablet    Sig: TAKE 1 TABLET BY MOUTH IN THE A.M. AND TAKE 1 TABLET BY MOUTH AT BEDTIME    Dispense:  180 tablet    Refill:  3  . metoprolol tartrate (LOPRESSOR) 50 MG tablet    Sig: Take 1 tablet (50 mg total) by mouth 2 (two) times daily.    Dispense:  180 tablet    Refill:  3  . simvastatin (ZOCOR) 40 MG tablet    Sig: Take 1 tablet (40 mg total) by mouth every evening.    Dispense:  90 tablet    Refill:  3  . hydrALAZINE (APRESOLINE) 25 MG tablet    Sig: Take 1 tablet (25 mg total) by mouth 3 (three) times daily.    Dispense:  270 tablet    Refill:  3    Disposition:  Follow up in 1 year(s)  Signed, Fransico Him, MD  08/12/2018 9:05 AM    [place]

## 2018-08-12 ENCOUNTER — Telehealth (INDEPENDENT_AMBULATORY_CARE_PROVIDER_SITE_OTHER): Payer: Medicare Other | Admitting: Cardiology

## 2018-08-12 ENCOUNTER — Encounter: Payer: Self-pay | Admitting: Cardiology

## 2018-08-12 ENCOUNTER — Other Ambulatory Visit: Payer: Self-pay

## 2018-08-12 VITALS — BP 168/84 | HR 61 | Ht 74.0 in | Wt 158.0 lb

## 2018-08-12 DIAGNOSIS — Z7189 Other specified counseling: Secondary | ICD-10-CM | POA: Diagnosis not present

## 2018-08-12 DIAGNOSIS — E7800 Pure hypercholesterolemia, unspecified: Secondary | ICD-10-CM

## 2018-08-12 DIAGNOSIS — I2510 Atherosclerotic heart disease of native coronary artery without angina pectoris: Secondary | ICD-10-CM

## 2018-08-12 DIAGNOSIS — I10 Essential (primary) hypertension: Secondary | ICD-10-CM

## 2018-08-12 DIAGNOSIS — R001 Bradycardia, unspecified: Secondary | ICD-10-CM | POA: Diagnosis not present

## 2018-08-12 DIAGNOSIS — I6523 Occlusion and stenosis of bilateral carotid arteries: Secondary | ICD-10-CM

## 2018-08-12 DIAGNOSIS — I739 Peripheral vascular disease, unspecified: Secondary | ICD-10-CM | POA: Diagnosis not present

## 2018-08-12 DIAGNOSIS — G458 Other transient cerebral ischemic attacks and related syndromes: Secondary | ICD-10-CM | POA: Diagnosis not present

## 2018-08-12 MED ORDER — METOPROLOL TARTRATE 50 MG PO TABS: 50.0000 mg | ORAL_TABLET | Freq: Two times a day (BID) | ORAL | 3 refills | Status: DC

## 2018-08-12 MED ORDER — LOSARTAN POTASSIUM 50 MG PO TABS: ORAL_TABLET | ORAL | 3 refills | Status: DC

## 2018-08-12 MED ORDER — HYDRALAZINE HCL 25 MG PO TABS: 25.0000 mg | ORAL_TABLET | Freq: Three times a day (TID) | ORAL | 3 refills | Status: DC

## 2018-08-12 MED ORDER — SIMVASTATIN 40 MG PO TABS: 40.0000 mg | ORAL_TABLET | Freq: Every evening | ORAL | 3 refills | Status: DC

## 2018-08-12 NOTE — Patient Instructions (Signed)

## 2018-08-20 ENCOUNTER — Telehealth: Payer: Self-pay | Admitting: Cardiology

## 2018-08-20 NOTE — Telephone Encounter (Signed)
Follow Up:     Pt called and said he is supposed to call and give an update on his blood pressure.

## 2018-08-21 MED ORDER — LOSARTAN POTASSIUM 100 MG PO TABS: 100.0000 mg | ORAL_TABLET | Freq: Every day | ORAL | 0 refills | Status: DC

## 2018-08-21 NOTE — Telephone Encounter (Signed)
F/U Message             Patient was told to call in his s B/P readings. Pls call to advise  04/29/ 163/81 Pulse 59 Lunch 130/73 Pulse 51 Dinner 146/74 Pulse 62  04/30   167/81 Pulse 54 Lunch 136/72 Pulse 53 Dinner 166/85 Pulse 57  05/1      174/73 Pulse 54 Lunch 122/64 Pulse 58 Dinner 164/80 Pulse 61  05/02    160/86 Pulse 63 Lunch 140/72 Pulse 60 Dinner  160/77 Pulse 59  05/03     155/79 Pulse 60 Lunch 141/77 Pulse 53 Dinner  140/71 Pulse 62  05/04      174/90 Pulse 59 Lunch 136/72 Pulse 57 Dinner  128/69 Pulse 65  05/5         163/78 Pulse 55 Lunch 145/77 Pulse 52 Dinner  155/76 Pulse 52

## 2018-08-21 NOTE — Telephone Encounter (Signed)
Spoke with the patient, he expressed understanding about taking 100 mg losartan daily, by mouth and he will call with blood pressure results after a week.

## 2018-08-21 NOTE — Telephone Encounter (Signed)
I would like patient to stop taking his losartan 50 mg twice daily and start taking losartan 100 mg every morning.  I would then like him to monitor his blood pressures again for a week and call me with the results.

## 2018-08-27 NOTE — Telephone Encounter (Signed)
Follow up  Blood pressure readings:   08/22/18 137/68 hr 48 08/23/18  148/77 hr 52 08/24/18 118/64 hr 55 08/25/18 119/63 hr 45 08/26/18 135/74 38 hr 08/27/18 154/76 48 hr

## 2018-08-27 NOTE — Telephone Encounter (Signed)
Dr. Radford Pax, the pt is calling you back as you advised on 5/7, to report his current BP readings since you advised for him to take losartan 100 mg po every morning.  Please review and advise as needed.

## 2018-08-29 MED ORDER — METOPROLOL TARTRATE 25 MG PO TABS: 25.0000 mg | ORAL_TABLET | Freq: Two times a day (BID) | ORAL | 1 refills | Status: DC

## 2018-08-29 MED ORDER — HYDRALAZINE HCL 50 MG PO TABS: 50.0000 mg | ORAL_TABLET | Freq: Three times a day (TID) | ORAL | 1 refills | Status: DC

## 2018-08-29 NOTE — Addendum Note (Signed)
Addended by: Stanton Kidney on: 08/29/2018 10:12 AM   Modules accepted: Orders

## 2018-08-29 NOTE — Telephone Encounter (Signed)
HRs are low - decrease lopressor to 25mg  BID and increase Hydralazine to 50mg  TID and check BP and HR daily for a week and call with the results.

## 2018-08-29 NOTE — Telephone Encounter (Signed)
Pt is on his morning walk and asks that I call back.  Advised I would call back later so that he can write down instructions.

## 2018-08-29 NOTE — Telephone Encounter (Signed)
Pt advised of recommendations. Pt agreeable to plans. Pt will call office next week to report numbers.

## 2018-09-04 ENCOUNTER — Telehealth: Payer: Self-pay | Admitting: Cardiology

## 2018-09-04 MED ORDER — HYDRALAZINE HCL 50 MG PO TABS: 50.0000 mg | ORAL_TABLET | Freq: Three times a day (TID) | ORAL | 3 refills | Status: DC

## 2018-09-04 NOTE — Telephone Encounter (Signed)
Pt has been recording his BP and is calling back with the readings  05/15: 124/65 HR 57 (bed) 05/16: 156/77 HR 61 (am)  128/68 HR 59 (lunch)   136/67 HR 61 (Bed) 05/17: 160/72 HR 65 (am)  165/76 HR 55 (lunch)  131/61 HR 60 (night)  110/60 HR 57 bed 05/18: 163/74 HR 65 (am)  138/72 HR 54 (lunch)  145/69 HR 65 (dinner)   105/61 HR 55 (bed) 05/19: 166/84 HR 63 (am)  127/65 HR 54 (lunch)  143/75 HR 58 (dimer)  115/63 HR 53 (bed) 05/20: 173/88 HR 72 (am)  137/77 HR 53 (breakfast)  123/71 HR 54  157/76 HR 54   115/61 HR 53 bed) 05/21: 167/82 HR 57 (am)

## 2018-09-04 NOTE — Telephone Encounter (Signed)
No change in meds at this time.  Most BPs are controlled

## 2018-09-04 NOTE — Telephone Encounter (Signed)
Pt aware of recommendations ./cy 

## 2018-09-04 NOTE — Telephone Encounter (Signed)
AM readings are taken when he first gets up before taking medications. He takes medications around 7 AM.  He goes for a 2 mile walk every morning and checks his BP right after returning from walk.  This is about one and half to two hours after taking AM medications.

## 2018-09-04 NOTE — Telephone Encounter (Signed)
Please find out if the am readings were before or after he took his meds

## 2018-09-05 DIAGNOSIS — Z961 Presence of intraocular lens: Secondary | ICD-10-CM | POA: Diagnosis not present

## 2018-09-05 DIAGNOSIS — E119 Type 2 diabetes mellitus without complications: Secondary | ICD-10-CM | POA: Diagnosis not present

## 2018-09-05 DIAGNOSIS — Z7984 Long term (current) use of oral hypoglycemic drugs: Secondary | ICD-10-CM | POA: Diagnosis not present

## 2018-09-30 DIAGNOSIS — I739 Peripheral vascular disease, unspecified: Secondary | ICD-10-CM | POA: Diagnosis not present

## 2018-09-30 DIAGNOSIS — I25118 Atherosclerotic heart disease of native coronary artery with other forms of angina pectoris: Secondary | ICD-10-CM | POA: Diagnosis not present

## 2018-09-30 DIAGNOSIS — E785 Hyperlipidemia, unspecified: Secondary | ICD-10-CM | POA: Diagnosis not present

## 2018-09-30 DIAGNOSIS — E118 Type 2 diabetes mellitus with unspecified complications: Secondary | ICD-10-CM | POA: Diagnosis not present

## 2018-09-30 DIAGNOSIS — Z79899 Other long term (current) drug therapy: Secondary | ICD-10-CM | POA: Diagnosis not present

## 2018-10-13 DIAGNOSIS — L570 Actinic keratosis: Secondary | ICD-10-CM | POA: Diagnosis not present

## 2018-10-13 DIAGNOSIS — Z85828 Personal history of other malignant neoplasm of skin: Secondary | ICD-10-CM | POA: Diagnosis not present

## 2018-10-13 DIAGNOSIS — L821 Other seborrheic keratosis: Secondary | ICD-10-CM | POA: Diagnosis not present

## 2018-10-14 DIAGNOSIS — E785 Hyperlipidemia, unspecified: Secondary | ICD-10-CM | POA: Diagnosis not present

## 2018-10-14 DIAGNOSIS — Z9181 History of falling: Secondary | ICD-10-CM | POA: Diagnosis not present

## 2018-10-14 DIAGNOSIS — Z0000 Encounter for general adult medical examination without abnormal findings: Secondary | ICD-10-CM | POA: Diagnosis not present

## 2018-10-14 DIAGNOSIS — Z125 Encounter for screening for malignant neoplasm of prostate: Secondary | ICD-10-CM | POA: Diagnosis not present

## 2018-12-09 ENCOUNTER — Other Ambulatory Visit: Payer: Self-pay | Admitting: Cardiology

## 2018-12-31 DIAGNOSIS — E118 Type 2 diabetes mellitus with unspecified complications: Secondary | ICD-10-CM | POA: Diagnosis not present

## 2018-12-31 DIAGNOSIS — Z79899 Other long term (current) drug therapy: Secondary | ICD-10-CM | POA: Diagnosis not present

## 2018-12-31 DIAGNOSIS — I25118 Atherosclerotic heart disease of native coronary artery with other forms of angina pectoris: Secondary | ICD-10-CM | POA: Diagnosis not present

## 2018-12-31 DIAGNOSIS — Z139 Encounter for screening, unspecified: Secondary | ICD-10-CM | POA: Diagnosis not present

## 2018-12-31 DIAGNOSIS — I119 Hypertensive heart disease without heart failure: Secondary | ICD-10-CM | POA: Diagnosis not present

## 2018-12-31 DIAGNOSIS — E785 Hyperlipidemia, unspecified: Secondary | ICD-10-CM | POA: Diagnosis not present

## 2019-01-10 DIAGNOSIS — Z23 Encounter for immunization: Secondary | ICD-10-CM | POA: Diagnosis not present

## 2019-05-12 ENCOUNTER — Other Ambulatory Visit: Payer: Self-pay | Admitting: Cardiology

## 2019-07-02 DIAGNOSIS — Z79899 Other long term (current) drug therapy: Secondary | ICD-10-CM | POA: Diagnosis not present

## 2019-07-02 DIAGNOSIS — I25118 Atherosclerotic heart disease of native coronary artery with other forms of angina pectoris: Secondary | ICD-10-CM | POA: Diagnosis not present

## 2019-07-02 DIAGNOSIS — E118 Type 2 diabetes mellitus with unspecified complications: Secondary | ICD-10-CM | POA: Diagnosis not present

## 2019-07-02 DIAGNOSIS — E785 Hyperlipidemia, unspecified: Secondary | ICD-10-CM | POA: Diagnosis not present

## 2019-07-02 DIAGNOSIS — F419 Anxiety disorder, unspecified: Secondary | ICD-10-CM | POA: Diagnosis not present

## 2019-07-18 ENCOUNTER — Emergency Department (HOSPITAL_COMMUNITY): Payer: Medicare Other

## 2019-07-18 ENCOUNTER — Emergency Department (HOSPITAL_COMMUNITY)
Admission: EM | Admit: 2019-07-18 | Discharge: 2019-07-18 | Disposition: A | Discharge status: Home / Self Care | Payer: Medicare Other | Attending: Emergency Medicine | Admitting: Emergency Medicine

## 2019-07-18 ENCOUNTER — Other Ambulatory Visit: Payer: Self-pay

## 2019-07-18 DIAGNOSIS — N23 Unspecified renal colic: Secondary | ICD-10-CM | POA: Insufficient documentation

## 2019-07-18 DIAGNOSIS — Z79899 Other long term (current) drug therapy: Secondary | ICD-10-CM | POA: Insufficient documentation

## 2019-07-18 DIAGNOSIS — I2510 Atherosclerotic heart disease of native coronary artery without angina pectoris: Secondary | ICD-10-CM | POA: Diagnosis not present

## 2019-07-18 DIAGNOSIS — N132 Hydronephrosis with renal and ureteral calculous obstruction: Secondary | ICD-10-CM | POA: Diagnosis not present

## 2019-07-18 DIAGNOSIS — F17210 Nicotine dependence, cigarettes, uncomplicated: Secondary | ICD-10-CM | POA: Insufficient documentation

## 2019-07-18 DIAGNOSIS — I10 Essential (primary) hypertension: Secondary | ICD-10-CM | POA: Diagnosis not present

## 2019-07-18 DIAGNOSIS — R109 Unspecified abdominal pain: Secondary | ICD-10-CM | POA: Diagnosis present

## 2019-07-18 DIAGNOSIS — E119 Type 2 diabetes mellitus without complications: Secondary | ICD-10-CM | POA: Insufficient documentation

## 2019-07-18 DIAGNOSIS — N201 Calculus of ureter: Secondary | ICD-10-CM | POA: Diagnosis not present

## 2019-07-18 DIAGNOSIS — Z7982 Long term (current) use of aspirin: Secondary | ICD-10-CM | POA: Diagnosis not present

## 2019-07-18 LAB — BASIC METABOLIC PANEL
Anion gap: 8 (ref 5–15)
BUN: 20 mg/dL (ref 8–23)
CO2: 29 mmol/L (ref 22–32)
Calcium: 8.9 mg/dL (ref 8.9–10.3)
Chloride: 97 mmol/L — ABNORMAL LOW (ref 98–111)
Creatinine, Ser: 1.11 mg/dL (ref 0.61–1.24)
GFR calc Af Amer: >60 mL/min (ref >60)
GFR calc non Af Amer: >60 mL/min (ref >60)
Glucose, Bld: 138 mg/dL — ABNORMAL HIGH (ref 70–99)
Potassium: 4.2 mmol/L (ref 3.5–5.1)
Sodium: 134 mmol/L — ABNORMAL LOW (ref 135–145)

## 2019-07-18 LAB — URINALYSIS, ROUTINE W REFLEX MICROSCOPIC
Bacteria, UA: NONE SEEN
Bilirubin Urine: NEGATIVE
Glucose, UA: NEGATIVE mg/dL
Ketones, ur: NEGATIVE mg/dL
Leukocytes,Ua: NEGATIVE
Nitrite: NEGATIVE
Protein, ur: 30 mg/dL — AB
RBC / HPF: >50 RBC/hpf — ABNORMAL HIGH (ref 0–5)
Specific Gravity, Urine: 1.015 (ref 1.005–1.030)
pH: 8 (ref 5.0–8.0)

## 2019-07-18 LAB — CBC WITH DIFFERENTIAL/PLATELET
Abs Immature Granulocytes: 0.04 10*3/uL (ref 0.00–0.07)
Basophils Absolute: 0 10*3/uL (ref 0.0–0.1)
Basophils Relative: 0 %
Eosinophils Absolute: 0 10*3/uL (ref 0.0–0.5)
Eosinophils Relative: 0 %
HCT: 41.1 % (ref 39.0–52.0)
Hemoglobin: 13.5 g/dL (ref 13.0–17.0)
Immature Granulocytes: 0 %
Lymphocytes Relative: 7 %
Lymphs Abs: 0.7 10*3/uL (ref 0.7–4.0)
MCH: 32.9 pg (ref 26.0–34.0)
MCHC: 32.8 g/dL (ref 30.0–36.0)
MCV: 100.2 fL — ABNORMAL HIGH (ref 80.0–100.0)
Monocytes Absolute: 0.9 10*3/uL (ref 0.1–1.0)
Monocytes Relative: 9 %
Neutro Abs: 8.5 10*3/uL — ABNORMAL HIGH (ref 1.7–7.7)
Neutrophils Relative %: 84 %
Platelets: 269 10*3/uL (ref 150–400)
RBC: 4.1 MIL/uL — ABNORMAL LOW (ref 4.22–5.81)
RDW: 12.9 % (ref 11.5–15.5)
WBC: 10.1 10*3/uL (ref 4.0–10.5)
nRBC: 0 % (ref 0.0–0.2)

## 2019-07-18 MED ORDER — ONDANSETRON 4 MG PO TBDP: 4.0000 mg | ORAL_TABLET | Freq: Three times a day (TID) | ORAL | 0 refills | Status: DC | PRN

## 2019-07-18 MED ORDER — MORPHINE SULFATE (PF) 4 MG/ML IV SOLN
4.0000 mg | Freq: Once | INTRAVENOUS | Status: AC
  Administered 2019-07-18: 4 mg via INTRAVENOUS
  Filled 2019-07-18: qty 1

## 2019-07-18 MED ORDER — FENTANYL CITRATE (PF) 100 MCG/2ML IJ SOLN
50.0000 ug | Freq: Once | INTRAMUSCULAR | Status: AC
  Administered 2019-07-18: 50 ug via INTRAVENOUS
  Filled 2019-07-18: qty 2

## 2019-07-18 MED ORDER — OXYCODONE-ACETAMINOPHEN 5-325 MG PO TABS: 1.0000 | ORAL_TABLET | Freq: Three times a day (TID) | ORAL | 0 refills | Status: DC | PRN

## 2019-07-18 NOTE — ED Triage Notes (Signed)
Patient states he has pain in right flank area, started last night. History of kidney stones

## 2019-07-18 NOTE — ED Provider Notes (Signed)
[facility] DEPT Provider Note   CSN: [id number] Arrival date & time: 07/18/19  1006     History Chief Complaint  Patient presents with  . Flank Pain    right    [name] is a 79 y.o. male with a past medical history of CAD, diabetes, diverticulosis presenting to the ED with a chief complaint of right flank pain.  Reports constant right flank pain since last night.  He has not taken any medications to help with the symptoms.  Pain was sudden in onset without specific trigger.  He was told several years ago that he had "a kidney stone that was stuck in that side" but it never caused him any pain.  He denies any dysuria, hematuria, fevers, injuries or falls, vomiting, nausea, changes to bowel movements, chest pain or shortness of breath.  HPI     Past Medical History:  Diagnosis Date  . Abscess of abdominal wall 11/28/2012  . Bladder cancer ([place])   . CAD (coronary artery disease)    Mild per cath in 2002/last nuclear stress per note  2010  . Carotid artery stenosis    Stable bilateral mild carotid artery disease with 1-39% stenosis and normal right vertebral flow but retrograde left vertebral flow c/w known subclavian stenosis  . Colonic diverticular abscess s/p perc drainage x2 11/10/2012  . Colonoscopy refused    Sister died from delayed Dx of post-colonoscopy   . Diabetes mellitus   . Diverticulitis [id number]   "all my life"  . Hyperlipidemia    Is on statin therapy  . Hypertension    LOV  Dr Aundra Dubin with clearance 05/08/11 and EKG  in [facility]   . Normal nuclear stress test 2010   EF 63% with no ischemia  . PVD (peripheral vascular disease) ([place]) 08/12/2017  . Skin cancer   . Subclavian steal syndrome    left; evaluated by Dr. Irish Lack; managed medically , no accurate B/p left arm. Dopplers 08/2017 - Stable bilateral mild carotid artery disease with 1-39% stenosis and normal right vertebral flow but retrograde left vertebral flow c/w known  subclavian stenosis    Patient Active Problem List   Diagnosis Date Noted  . Carotid artery stenosis   . PVD (peripheral vascular disease) ([place]) 08/12/2017  . Hernia, inguinal, left 07/18/2015  . Groin pain 07/18/2015  . Claudication ([place]) 01/02/2015  . Colovesical fistula - diverticular s/p lap colectomy/repair 01/30/13 12/24/2012  . Colocutaneous fistula - diverticular - s/p lap colectomy/repair 01/30/13 12/24/2012  . Diverticulitis - recurrent 12/08/2012  . Diabetes mellitus ([place]) 11/10/2012  . Smoking 11/25/2011  . Pre-operative clearance 05/08/2011  . Bradycardia 11/27/2010  . CAD (coronary artery disease) 11/27/2010  . Hyperlipidemia 11/27/2010  . HTN (hypertension) 11/27/2010  . Subclavian steal syndrome 11/27/2010    Past Surgical History:  Procedure Laterality Date  . APPENDECTOMY    . CARDIAC CATHETERIZATION  2002   He had a which showed mild coronary atherosclerosis and normal left ventricular function.  Marland Kitchen CATARACT EXTRACTION, BILATERAL Bilateral 01-23-13   11'13  . CYSTOSCOPY W/ URETERAL STENT PLACEMENT Bilateral 01/29/2013   Procedure: CYSTOSCOPY WITH BILATERAL RETROGRADE PYELOGRAM/BILATERAL URETERAL STENT PLACEMENT;  Surgeon: Fredricka Bonine, MD;  Location: [facility];  Service: Urology;  Laterality: Bilateral;  . CYSTOSCOPY WITH INJECTION     cystoscopy with BCG injections  . FISTULOTOMY N/A 01/29/2013   Procedure: repair colovesical fustula ;  Surgeon: Adin Hector, MD;  Location: [facility];  Service: General;  Laterality:  N/A;  . LAPAROSCOPIC SIGMOID COLECTOMY N/A 01/29/2013   Procedure: SPLENIC FLEXOR MOBILIZATION,LAPAROSCOPIC SIGMOID COLECTOMY, LYSIS OF ADHESIONS, OMENTOPEXY, RIGID PROCTOSCOPY;  Surgeon: Adin Hector, MD;  Location: [facility];  Service: General;  Laterality: N/A;  . PERITONEAL WOUND DRAINAGE  01-23-13   implant of abdomianl drain  . ROTATOR CUFF REPAIR     right  . TONSILLECTOMY    . TRANSURETHRAL RESECTION OF PROSTATE  05/17/2011    Procedure: TRANSURETHRAL RESECTION OF THE PROSTATE WITH GYRUS INSTRUMENTS;  Surgeon: Malka So, MD;  Location: [facility];  Service: Urology;  Laterality: N/A;       Family History  Problem Relation Age of Onset  . Hypertension Mother   . Lung cancer Mother   . COPD Father     Social History   Tobacco Use  . Smoking status: Current Every Day Smoker    Years: 55.00    Types: Pipe    Last attempt to quit: 05/14/1955    Years since quitting: 64.2  . Smokeless tobacco: Former Network engineer Use Topics  . Alcohol use: No  . Drug use: No    Home Medications Prior to Admission medications   Medication Sig Start Date End Date Taking? Authorizing Provider  aspirin 81 MG tablet Take 81 mg by mouth daily.   Yes [provider]  busPIRone (BUSPAR) 15 MG tablet Take 15 mg by mouth 2 (two) times daily. 07/02/19  Yes [provider]  diphenhydrAMINE (BENADRYL) 25 MG tablet Take 25 mg by mouth every 6 (six) hours as needed for allergies.   Yes [provider]  fish oil-omega-3 fatty acids 1000 MG capsule Take 1 g by mouth daily.    Yes [provider]  glipiZIDE (GLUCOTROL) 5 MG tablet Take 2.5 mg by mouth daily before breakfast.    Yes [provider]  hydrALAZINE (APRESOLINE) 50 MG tablet Take 1 tablet (50 mg total) by mouth 3 (three) times daily. 09/04/18 07/18/19 Yes Turner, Eber Hong, MD  losartan (COZAAR) 100 MG tablet TAKE 1 TABLET BY MOUTH  DAILY Patient taking differently: Take 100 mg by mouth daily.  12/09/18  Yes Turner, Eber Hong, MD  metoprolol tartrate (LOPRESSOR) 50 MG tablet Take 50 mg by mouth 2 (two) times daily. 05/26/19  Yes [provider]  Multiple Vitamin (MULTIVITAMIN) tablet Take 1 tablet by mouth daily.   Yes [provider]  simvastatin (ZOCOR) 40 MG tablet TAKE 1 TABLET BY MOUTH IN  THE EVENING Patient taking differently: Take 40 mg by mouth daily at 6 PM.  05/13/19  Yes Turner, Eber Hong, MD  tamsulosin (FLOMAX) 0.4  MG CAPS capsule Take 0.4 mg by mouth daily. 11/09/14  Yes [provider]  metoprolol tartrate (LOPRESSOR) 25 MG tablet Take 1 tablet (25 mg total) by mouth 2 (two) times daily. Patient not taking: Reported on 07/18/2019 08/29/18 11/27/18  Sueanne Margarita, MD  ondansetron (ZOFRAN ODT) 4 MG disintegrating tablet Take 1 tablet (4 mg total) by mouth every 8 (eight) hours as needed for nausea or vomiting. 07/18/19   Fortunata Betty, PA-C  oxyCODONE-acetaminophen (PERCOCET/ROXICET) 5-325 MG tablet Take 1 tablet by mouth every 8 (eight) hours as needed for severe pain. 07/18/19   Ondria Oswald, PA-C    Allergies    Dilaudid [hydromorphone hcl] and Hydromorphone  Review of Systems   Review of Systems  Constitutional: Negative for appetite change, chills and fever.  HENT: Negative for ear pain, rhinorrhea, sneezing and sore throat.  Eyes: Negative for photophobia and visual disturbance.  Respiratory: Negative for cough, chest tightness, shortness of breath and wheezing.   Cardiovascular: Negative for chest pain and palpitations.  Gastrointestinal: Negative for abdominal pain, blood in stool, constipation, diarrhea, nausea and vomiting.  Genitourinary: Positive for flank pain. Negative for dysuria, hematuria and urgency.  Musculoskeletal: Negative for myalgias.  Skin: Negative for rash.  Neurological: Negative for dizziness, weakness and light-headedness.    Physical Exam Updated Vital Signs BP (!) 190/76 (BP Location: Right Arm)   Pulse (!) 50   Temp (!) 97.3 F (36.3 C) (Oral)   Resp 18   Ht 6\' 2"  (1.88 m)   Wt 70.3 kg   SpO2 95%   BMI 19.90 kg/m   Physical Exam Vitals and nursing note reviewed.  Constitutional:      General: He is not in acute distress.    Appearance: He is well-developed.     Comments: Ambulatory.  HENT:     Head: Normocephalic and atraumatic.     Nose: Nose normal.  Eyes:     General: No scleral icterus.       Right eye: No discharge.        Left eye: No  discharge.     Conjunctiva/sclera: Conjunctivae normal.  Cardiovascular:     Rate and Rhythm: Normal rate and regular rhythm.     Heart sounds: Normal heart sounds. No murmur. No friction rub. No gallop.   Pulmonary:     Effort: Pulmonary effort is normal. No respiratory distress.     Breath sounds: Normal breath sounds.  Abdominal:     General: Bowel sounds are normal. There is no distension.     Palpations: Abdomen is soft.     Tenderness: There is abdominal tenderness (R flank). There is no guarding.  Musculoskeletal:        General: Normal range of motion.     Cervical back: Normal range of motion and neck supple.  Skin:    General: Skin is warm and dry.     Findings: No rash.  Neurological:     Mental Status: He is alert.     Motor: No abnormal muscle tone.     Coordination: Coordination normal.     ED Results / Procedures / Treatments   Labs (all labs ordered are listed, but only abnormal results are displayed) Labs Reviewed  BASIC METABOLIC PANEL - Abnormal; Notable for the following components:      Result Value   Sodium 134 (*)    Chloride 97 (*)    Glucose, Bld 138 (*)    All other components within normal limits  CBC WITH DIFFERENTIAL/PLATELET - Abnormal; Notable for the following components:   RBC 4.10 (*)    MCV 100.2 (*)    Neutro Abs 8.5 (*)    All other components within normal limits  URINALYSIS, ROUTINE W REFLEX MICROSCOPIC - Abnormal; Notable for the following components:   APPearance CLOUDY (*)    Hgb urine dipstick LARGE (*)    Protein, ur 30 (*)    RBC / HPF >50 (*)    All other components within normal limits  URINE CULTURE    EKG None  Radiology CT Renal Stone Study  Result Date: 07/18/2019 CLINICAL DATA:  Sudden severe RIGHT flank pain. History of kidney stones. EXAM: CT ABDOMEN AND PELVIS WITHOUT CONTRAST TECHNIQUE: Multidetector CT imaging of the abdomen and pelvis was performed following the standard protocol without IV contrast.  COMPARISON:  CT  abdomen dated 12/07/2014. FINDINGS: Lower chest: Streaky consolidations at the bilateral lung bases, most likely atelectasis. Hepatobiliary: No focal liver abnormality is seen. No gallstones, gallbladder wall thickening, or biliary dilatation. Pancreas: Unremarkable. No pancreatic ductal dilatation or surrounding inflammatory changes. Spleen: Normal in size without focal abnormality. Adrenals/Urinary Tract: 3 mm stone within the proximal RIGHT ureter causing moderate RIGHT-sided hydronephrosis and perinephric edema. Scattered additional calcifications within the RIGHT renal hilum, suspected vascular calcifications rather than additional renal stones. LEFT kidney is unremarkable without stone or hydronephrosis. Bladder appears normal, partially decompressed. Stomach/Bowel: No dilated large or small bowel loops. Surgical anastomosis within the sigmoid colon with no evidence of obstruction or other postsurgical complicating feature. Diverticulosis of the descending and sigmoid colon but no focal inflammatory change to suggest acute diverticulitis. Stomach is unremarkable, partially decompressed. Vascular/Lymphatic: Extensive aortic atherosclerosis. No enlarged lymph nodes seen. Reproductive: Prostate gland is at least mildly enlarged causing slight mass effect on the bladder base. Other: No abscess collection or free intraperitoneal air seen. Musculoskeletal: Degenerative spondylosis of the lower lumbar spine, at least moderate in degree. No acute or suspicious osseous finding. IMPRESSION: 1. 3 mm stone within the proximal RIGHT ureter causing moderate RIGHT-sided hydronephrosis and perinephric edema. 2. Colonic diverticulosis without evidence of acute diverticulitis. Aortic Atherosclerosis (ICD10-I70.0). Electronically Signed   By: Franki Cabot M.D.   On: 07/18/2019 11:53    Procedures Procedures (including critical care time)  Medications Ordered in ED Medications  fentaNYL (SUBLIMAZE)  injection 50 mcg (50 mcg Intravenous Given 07/18/19 1101)  morphine 4 MG/ML injection 4 mg (4 mg Intravenous Given 07/18/19 1234)    ED Course  I have reviewed the triage vital signs and the nursing notes.  Pertinent labs & imaging results that were available during my care of the patient were reviewed by me and considered in my medical decision making (see chart for details).    MDM Rules/Calculators/A&P                      79 year old male with past medical history of CAD, diabetes presenting to the ED with a chief complaint of right-sided flank pain since last night.  Was told in the past that he had nephrolithiasis but has never caused him any pain.  Denies any hematuria, dysuria, fevers, changes to bowel movements or vomiting.  On my exam there is tenderness palpation of the right flank area.  No abdominal tenderness palpation.  He is afebrile without recent use of antipyretics.  BMP, CBC unremarkable.  Urinalysis with hematuria, no signs of infection.  CT renal stone study shows proximal right 3 mm stone with hydronephrosis.  Suspect this is the cause of his symptoms today.  Patient's pain controlled here in the ED with morphine.  I offered Toradol to patient but he declines.  He has a urologist, Dr. Jeffie Pollock so asked him to follow-up if symptoms do not improve in 4 to 5 days.  Will provide pain and nausea medication.  Patient is hemodynamically stable, in NAD, and able to ambulate in the ED. Evaluation does not show pathology that would require ongoing emergent intervention or inpatient treatment. I have personally reviewed and interpreted all lab work and imaging at today's ED visit. I explained the diagnosis to the patient. Pain has been managed and has no complaints prior to discharge. Patient is comfortable with above plan and is stable for discharge at this time. All questions were answered prior to disposition. Strict return precautions for returning to the  ED were discussed. Encouraged follow  up with PCP.   An After Visit Summary was printed and given to the patient.  Prior to providing a prescription for a controlled substance, I independently reviewed the patient's recent prescription history on the [facility]. The patient had no recent or regular prescriptions and was deemed appropriate for a brief, less than 3 day prescription of narcotic for acute analgesia.   Portions of this note were generated with [employer]. Dictation errors may occur despite best attempts at proofreading.  Final Clinical Impression(s) / ED Diagnoses Final diagnoses:  Ureterolithiasis  Ureteral colic    Rx / DC Orders ED Discharge Orders         Ordered    oxyCODONE-acetaminophen (PERCOCET/ROXICET) 5-325 MG tablet  Every 8 hours PRN     07/18/19 1313    ondansetron (ZOFRAN ODT) 4 MG disintegrating tablet  Every 8 hours PRN     07/18/19 1313           Delia Heady, PA-C 07/18/19 1318    Blanchie Dessert, MD 07/18/19 1502

## 2019-07-18 NOTE — ED Notes (Signed)
Disregard phlebotomy note.

## 2019-07-18 NOTE — Discharge Instructions (Signed)
Take the pain medication as needed to help with your symptoms. Zofran as needed for nausea. Follow up with your urologist. Return to the ED if you start to experience worsening pain, fever, chest pain or shortness of breath.

## 2019-07-19 LAB — URINE CULTURE: Culture: NO GROWTH

## 2019-07-20 DIAGNOSIS — N201 Calculus of ureter: Secondary | ICD-10-CM | POA: Diagnosis not present

## 2019-07-27 ENCOUNTER — Other Ambulatory Visit: Payer: Self-pay | Admitting: Cardiology

## 2019-08-13 ENCOUNTER — Ambulatory Visit (INDEPENDENT_AMBULATORY_CARE_PROVIDER_SITE_OTHER): Payer: Medicare Other | Admitting: Cardiology

## 2019-08-13 ENCOUNTER — Other Ambulatory Visit: Payer: Self-pay

## 2019-08-13 ENCOUNTER — Encounter: Payer: Self-pay | Admitting: Cardiology

## 2019-08-13 VITALS — BP 140/72 | HR 68 | Ht 74.0 in | Wt 154.4 lb

## 2019-08-13 DIAGNOSIS — I10 Essential (primary) hypertension: Secondary | ICD-10-CM | POA: Diagnosis not present

## 2019-08-13 DIAGNOSIS — E7800 Pure hypercholesterolemia, unspecified: Secondary | ICD-10-CM | POA: Diagnosis not present

## 2019-08-13 DIAGNOSIS — I739 Peripheral vascular disease, unspecified: Secondary | ICD-10-CM

## 2019-08-13 DIAGNOSIS — I6523 Occlusion and stenosis of bilateral carotid arteries: Secondary | ICD-10-CM | POA: Diagnosis not present

## 2019-08-13 MED ORDER — METOPROLOL TARTRATE 50 MG PO TABS: 25.0000 mg | ORAL_TABLET | Freq: Two times a day (BID) | ORAL | 0 refills | Status: DC

## 2019-08-13 MED ORDER — HYDRALAZINE HCL 50 MG PO TABS: 25.0000 mg | ORAL_TABLET | Freq: Three times a day (TID) | ORAL | 3 refills | Status: DC

## 2019-08-13 MED ORDER — LOSARTAN POTASSIUM 100 MG PO TABS: 100.0000 mg | ORAL_TABLET | Freq: Every day | ORAL | 3 refills | Status: DC

## 2019-08-13 NOTE — Addendum Note (Signed)
Addended by: Antonieta Iba on: 08/13/2019 08:32 AM   Modules accepted: Orders

## 2019-08-13 NOTE — Progress Notes (Signed)
Cardiology Office Note:    Date:  08/13/2019   ID:  [name], DOB 22-Sep-1940, MRN [id number]  PCP:  Janine Limbo, PA-C  Cardiologist:  Fransico Him, MD    Referring MD: Janine Limbo, PA-C   Chief Complaint  Patient presents with  . Follow-up    Carotid artery stenosis, HTN, HLD, PVD    History of Present Illness:    [name] is a 79 y.o. male with a hx of subclavian steal syndrome, PAD,DM, HTN, hyperlipidemiaand diabetes. He had acath in 2002 with minimal disease. He has left subclavian stenosis with evidence for steal by doppler imaging, last dopplers done in 9/15. Peripheral arterial dopplers in 9/16 showed bilateral L>R SFA disease. He has had significant claudication. He was seen by Dr Fletcher Anon who recommended angiography, but he did not want to have the procedure done. He was started on cilostazol but this caused abdominal pain.  He is here today for followup and is doing well.  He denies any chest pain or pressure, PND, orthopnea, LE edema, dizziness, palpitations or syncope. He has chronic DOE but it is stable and does not stop him from walking.  He says the SOB has not changed any and thinks that it is the pollen. He denies any claudication in his left arm.  He walks 2.5 miles daily and occasionally will have some leg pain but that resolves as he continues to walk.  He is compliant with his meds and is tolerating meds with no SE.    Past Medical History:  Diagnosis Date  . Abscess of abdominal wall 11/28/2012  . Bladder cancer ([place])   . CAD (coronary artery disease)    Mild per cath in 2002/last nuclear stress per note  2010  . Carotid artery stenosis    Stable bilateral mild carotid artery disease with 1-39% stenosis and normal right vertebral flow but retrograde left vertebral flow c/w known subclavian stenosis  . Colonic diverticular abscess s/p perc drainage x2 11/10/2012  . Colonoscopy refused    Sister died from delayed Dx of post-colonoscopy   .  Diabetes mellitus   . Diverticulitis 3557-3220   "all my life"  . Hyperlipidemia    Is on statin therapy  . Hypertension    LOV  Dr Aundra Dubin with clearance 05/08/11 and EKG  in EPIC   . Normal nuclear stress test 2010   EF 63% with no ischemia  . PVD (peripheral vascular disease) ([facility]) 08/12/2017  . Skin cancer   . Subclavian steal syndrome    left; evaluated by Dr. Irish Lack; managed medically , no accurate B/p left arm. Dopplers 08/2017 - Stable bilateral mild carotid artery disease with 1-39% stenosis and normal right vertebral flow but retrograde left vertebral flow c/w known subclavian stenosis    Past Surgical History:  Procedure Laterality Date  . APPENDECTOMY    . CARDIAC CATHETERIZATION  2002   He had a which showed mild coronary atherosclerosis and normal left ventricular function.  Marland Kitchen CATARACT EXTRACTION, BILATERAL Bilateral 01-23-13   11'13  . CYSTOSCOPY W/ URETERAL STENT PLACEMENT Bilateral 01/29/2013   Procedure: CYSTOSCOPY WITH BILATERAL RETROGRADE PYELOGRAM/BILATERAL URETERAL STENT PLACEMENT;  Surgeon: Fredricka Bonine, MD;  Location: [facility];  Service: Urology;  Laterality: Bilateral;  . CYSTOSCOPY WITH INJECTION     cystoscopy with BCG injections  . FISTULOTOMY N/A 01/29/2013   Procedure: repair colovesical fustula ;  Surgeon: Adin Hector, MD;  Location: [facility];  Service: General;  Laterality: N/A;  .  LAPAROSCOPIC SIGMOID COLECTOMY N/A 01/29/2013   Procedure: SPLENIC FLEXOR MOBILIZATION,LAPAROSCOPIC SIGMOID COLECTOMY, LYSIS OF ADHESIONS, OMENTOPEXY, RIGID PROCTOSCOPY;  Surgeon: Adin Hector, MD;  Location: [facility];  Service: General;  Laterality: N/A;  . PERITONEAL WOUND DRAINAGE  01-23-13   implant of abdomianl drain  . ROTATOR CUFF REPAIR     right  . TONSILLECTOMY    . TRANSURETHRAL RESECTION OF PROSTATE  05/17/2011   Procedure: TRANSURETHRAL RESECTION OF THE PROSTATE WITH GYRUS INSTRUMENTS;  Surgeon: Malka So, MD;  Location: [facility];  Service: Urology;   Laterality: N/A;    Current Medications: Current Meds  Medication Sig  . aspirin 81 MG tablet Take 81 mg by mouth daily.  . busPIRone (BUSPAR) 15 MG tablet Take 15 mg by mouth 2 (two) times daily.  . diphenhydrAMINE (BENADRYL) 25 MG tablet Take 25 mg by mouth every 6 (six) hours as needed for allergies.  . fish oil-omega-3 fatty acids 1000 MG capsule Take 1 g by mouth daily.   Marland Kitchen glipiZIDE (GLUCOTROL) 5 MG tablet Take 2.5 mg by mouth daily before breakfast.   . hydrALAZINE (APRESOLINE) 50 MG tablet Take 1 tablet (50 mg total) by mouth 3 (three) times daily. Please keep upcoming appt in April with Dr. Radford Pax before anymore refills. Thank you (Patient taking differently: Take 25 mg by mouth 3 (three) times daily. Please keep upcoming appt in April with Dr. Radford Pax before anymore refills. Thank you)  . losartan (COZAAR) 100 MG tablet TAKE 1 TABLET BY MOUTH  DAILY (Patient taking differently: Take 100 mg by mouth daily. )  . metoprolol tartrate (LOPRESSOR) 25 MG tablet Take 1 tablet (25 mg total) by mouth 2 (two) times daily.  . Multiple Vitamin (MULTIVITAMIN) tablet Take 1 tablet by mouth daily.  . simvastatin (ZOCOR) 40 MG tablet TAKE 1 TABLET BY MOUTH IN  THE EVENING  . tamsulosin (FLOMAX) 0.4 MG CAPS capsule Take 0.4 mg by mouth daily.     Allergies:   Dilaudid [hydromorphone hcl] and Hydromorphone   Social History   Socioeconomic History  . Marital status: Married    Spouse name: Not on file  . Number of children: Not on file  . Years of education: Not on file  . Highest education level: Not on file  Occupational History  . Not on file  Tobacco Use  . Smoking status: Current Every Day Smoker    Years: 55.00    Types: Pipe    Last attempt to quit: 05/14/1955    Years since quitting: 64.2  . Smokeless tobacco: Former Network engineer and Sexual Activity  . Alcohol use: No  . Drug use: No  . Sexual activity: Yes  Other Topics Concern  . Not on file  Social History Narrative  .  Not on file   Social Determinants of Health   Financial Resource Strain:   . Difficulty of Paying Living Expenses:   Food Insecurity:   . Worried About Charity fundraiser in the Last Year:   . Arboriculturist in the Last Year:   Transportation Needs:   . Film/video editor (Medical):   Marland Kitchen Lack of Transportation (Non-Medical):   Physical Activity:   . Days of Exercise per Week:   . Minutes of Exercise per Session:   Stress:   . Feeling of Stress :   Social Connections:   . Frequency of Communication with Friends and Family:   . Frequency of Social Gatherings with Friends and Family:   .  Attends Religious Services:   . Active Member of Clubs or Organizations:   . Attends Archivist Meetings:   Marland Kitchen Marital Status:      Family History: The patient's family history includes COPD in his father; Hypertension in his mother; Lung cancer in his mother.  ROS:   Please see the history of present illness.    ROS  All other systems reviewed and negative.   EKGs/Labs/Other Studies Reviewed:    The following studies were reviewed today: none  EKG:  EKG is  ordered today.  The ekg ordered today demonstrates NSR with PACs and IRBBB  Recent Labs: 07/18/2019: BUN 20; Creatinine, Ser 1.11; Hemoglobin 13.5; Platelets 269; Potassium 4.2; Sodium 134   Recent Lipid Panel    Component Value Date/Time   CHOL 110 (L) 12/31/2014 1638   TRIG 152 (H) 12/31/2014 1638   HDL 41 12/31/2014 1638   CHOLHDL 2.7 12/31/2014 1638   VLDL 30 12/31/2014 1638   LDLCALC 39 12/31/2014 1638    Physical Exam:    VS:  BP 140/72   Pulse 68   Ht 6\' 2"  (1.88 m)   Wt 154 lb 6.4 oz (70 kg)   BMI 19.82 kg/m     Wt Readings from Last 3 Encounters:  08/13/19 154 lb 6.4 oz (70 kg)  07/18/19 155 lb (70.3 kg)  08/12/18 158 lb (71.7 kg)     GEN:  Well nourished, well developed in no acute distress HEENT: Normal NECK: No JVD; No carotid bruits LYMPHATICS: No lymphadenopathy CARDIAC: RRR, no  murmurs, rubs, gallops RESPIRATORY:  Clear to auscultation without rales, wheezing or rhonchi  ABDOMEN: Soft, non-tender, non-distended MUSCULOSKELETAL:  No edema; No deformity  SKIN: Warm and dry NEUROLOGIC:  Alert and oriented x 3 PSYCHIATRIC:  Normal affect   ASSESSMENT:    1. Bilateral carotid artery stenosis   2. Essential hypertension   3. Pure hypercholesterolemia   4. PVD (peripheral vascular disease) ([place])    PLAN:    In order of problems listed above:  1.  Carotid artery stenosis with Subclavian steel syndrome  - He has not had any arm numbness or claudication symptoms.  -His last dopplers 08/2017 showed 1-39% bilateral stenosis and < 50% stenosis in the CCA bilaterally and stenotic left subclavian artery with steal.   -continue ASA and statin -repeat carotid dopplers  2.  HTN  -BP controlled -continue Lopressor 25mg  BID, Losartan 50mg  daily and Hydralazine 25mg  TID -outside labs reviewed from PCP and showed a Creatinine of 1.11 and K+ 4.2  3.  Hyperlipidemia -LDL goal is less than 70.   -LDL recently done and normal at 44 -continue Simvastatin 40mg  daily  4.   PVD  - Peripheral arterial dopplers in 9/16 showed bilateral L>R SFA disease.  -He was seen by Dr Fletcher Anon who recommended angiography, but he did not want to have the procedure done.  -He was tried on Cilastazol but did no tolerate due to abdominal pain.   -He continues with his walking program walking 2.5 miles daily and has not had any claudication symptoms.   -continue statin  5.  Bradycardia -this is secondary to his beta-blocker.   -remains asymptomatic and HR in the 60's  Medication Adjustments/Labs and Tests Ordered: Current medicines are reviewed at length with the patient today.  Concerns regarding medicines are outlined above.  Orders Placed This Encounter  Procedures  . EKG 12-Lead   No orders of the defined types were placed in this encounter.  Signed, Fransico Him, MD   08/13/2019 8:20 AM    [facility]

## 2019-08-13 NOTE — Patient Instructions (Signed)
Medication Instructions:  Your physician recommends that you continue on your current medications as directed. Please refer to the Current Medication list given to you today.  *If you need a refill on your cardiac medications before your next appointment, please call your pharmacy*   Testing/Procedures: Your physician has requested that you have a carotid duplex. This test is an ultrasound of the carotid arteries in your neck. It looks at blood flow through these arteries that supply the brain with blood. Allow one hour for this exam. There are no restrictions or special instructions.   Follow-Up: At [facility], you and your health needs are our priority.  As part of our continuing mission to provide you with exceptional heart care, we have created designated Provider Care Teams.  These Care Teams include your primary Cardiologist (physician) and Advanced Practice Providers (APPs -  Physician Assistants and Nurse Practitioners) who all work together to provide you with the care you need, when you need it.  We recommend signing up for the patient portal called "MyChart".  Sign up information is provided on this After Visit Summary.  MyChart is used to connect with patients for Virtual Visits (Telemedicine).  Patients are able to view lab/test results, encounter notes, upcoming appointments, etc.  Non-urgent messages can be sent to your provider as well.   To learn more about what you can do with MyChart, go to [email or web address].    Your next appointment:   1 year(s)  The format for your next appointment:   In Person  Provider:   Fransico Him, MD

## 2019-08-25 ENCOUNTER — Encounter: Payer: Self-pay | Admitting: Cardiology

## 2019-08-25 ENCOUNTER — Ambulatory Visit (HOSPITAL_COMMUNITY)
Admission: RE | Admit: 2019-08-25 | Discharge: 2019-08-25 | Disposition: A | Discharge status: Home / Self Care | Payer: Medicare Other | Source: Ambulatory Visit | Attending: Cardiology | Admitting: Cardiology

## 2019-08-25 ENCOUNTER — Other Ambulatory Visit: Payer: Self-pay

## 2019-08-25 DIAGNOSIS — I6523 Occlusion and stenosis of bilateral carotid arteries: Secondary | ICD-10-CM | POA: Insufficient documentation

## 2019-08-31 DIAGNOSIS — N200 Calculus of kidney: Secondary | ICD-10-CM | POA: Diagnosis not present

## 2019-08-31 DIAGNOSIS — R3121 Asymptomatic microscopic hematuria: Secondary | ICD-10-CM | POA: Diagnosis not present

## 2019-08-31 DIAGNOSIS — R1084 Generalized abdominal pain: Secondary | ICD-10-CM | POA: Diagnosis not present

## 2019-09-07 DIAGNOSIS — E119 Type 2 diabetes mellitus without complications: Secondary | ICD-10-CM | POA: Diagnosis not present

## 2019-09-07 DIAGNOSIS — Z961 Presence of intraocular lens: Secondary | ICD-10-CM | POA: Diagnosis not present

## 2019-09-07 DIAGNOSIS — Z7984 Long term (current) use of oral hypoglycemic drugs: Secondary | ICD-10-CM | POA: Diagnosis not present

## 2019-10-12 ENCOUNTER — Other Ambulatory Visit: Payer: Self-pay | Admitting: Cardiology

## 2019-10-22 DIAGNOSIS — D649 Anemia, unspecified: Secondary | ICD-10-CM | POA: Diagnosis not present

## 2019-11-05 DIAGNOSIS — R630 Anorexia: Secondary | ICD-10-CM | POA: Diagnosis not present

## 2019-11-05 DIAGNOSIS — R195 Other fecal abnormalities: Secondary | ICD-10-CM | POA: Diagnosis not present

## 2019-11-05 DIAGNOSIS — R05 Cough: Secondary | ICD-10-CM | POA: Diagnosis not present

## 2019-11-05 DIAGNOSIS — R0602 Shortness of breath: Secondary | ICD-10-CM | POA: Diagnosis not present

## 2019-11-05 DIAGNOSIS — R1310 Dysphagia, unspecified: Secondary | ICD-10-CM | POA: Diagnosis not present

## 2019-11-05 DIAGNOSIS — R6881 Early satiety: Secondary | ICD-10-CM | POA: Diagnosis not present

## 2019-11-05 DIAGNOSIS — D649 Anemia, unspecified: Secondary | ICD-10-CM | POA: Diagnosis not present

## 2019-11-09 DIAGNOSIS — Z681 Body mass index (BMI) 19 or less, adult: Secondary | ICD-10-CM | POA: Diagnosis not present

## 2019-11-09 DIAGNOSIS — R195 Other fecal abnormalities: Secondary | ICD-10-CM | POA: Diagnosis not present

## 2019-11-09 DIAGNOSIS — J449 Chronic obstructive pulmonary disease, unspecified: Secondary | ICD-10-CM | POA: Diagnosis not present

## 2019-11-09 DIAGNOSIS — R636 Underweight: Secondary | ICD-10-CM | POA: Diagnosis not present

## 2019-11-23 ENCOUNTER — Telehealth: Payer: Self-pay | Admitting: *Deleted

## 2019-11-23 NOTE — Telephone Encounter (Signed)
   Primary Cardiologist: Fransico Him, MD  Chart reviewed as part of pre-operative protocol coverage. Given past medical history and time since last visit, based on ACC/AHA guidelines, [name] would be at acceptable risk for the planned procedure without further cardiovascular testing.   His aspirin may be held for 7 days prior to his procedure.  Please resume as soon as hemostasis is achieved.  I will route this recommendation to the requesting party via Epic fax function and remove from pre-op pool.  Please call with questions.  Jossie Ng. Andren Bethea NP-C    11/23/2019, 3:13 PM [facility] [place] [address] Office [phone] Fax [phone]

## 2019-11-23 NOTE — Telephone Encounter (Signed)
[name] 79 year old male would like to have a colonoscopy/EGD.  He was last seen in the clinic on 08/13/2019 and doing well at that time.  He was active and walking 2.5 miles daily.  He indicated that he did have some stable S OB and lower extremity pain.  May his aspirin be held for the procedure?  His PMH includes PAD, diabetes mellitus, HTN, hyperlipidemia, and subclavian steal syndrome.  He was noted to have left subclavian stenosis with evidence of steal by Doppler imaging.  He has been known to have significant claudication in the past.  He is followed by Dr. Sophronia Simas for this.  Please direct response to [facility] preop pool.  Thank you for your help.  Jossie Ng. Adin Lariccia NP-C    11/23/2019, 11:43 AM [name] [address] Office [phone] Fax [phone]

## 2019-11-23 NOTE — Telephone Encounter (Signed)
Ok to hold ASA for endoscopy

## 2019-11-23 NOTE — Telephone Encounter (Signed)
   [facility] Pre-operative Risk Assessment    HEARTCARE STAFF: - Please ensure there is not already an duplicate clearance open for this procedure. - Under Visit Info/Reason for Call, type in Other and utilize the format Clearance MM/DD/YY or Clearance TBD. Do not use dashes or single digits. - If request is for dental extraction, please clarify the # of teeth to be extracted.  Request for surgical clearance:  1. What type of surgery is being performed? COLONOSCOPY & ENDOSCOPY   2. When is this surgery scheduled? 12/28/19   3. What type of clearance is required (medical clearance vs. Pharmacy clearance to hold med vs. Both)? MEDICAL  4. Are there any medications that need to be held prior to surgery and how long? ASA    5. Practice name and name of physician performing surgery? [facility]; DR. MAGOD   6. What is the office phone number? [phone]   7.   What is the office fax number? [phone]   8.   Anesthesia type (None, local, MAC, general) ? NOT LISTED; PROPOFOL?   Julaine Hua 11/23/2019, 10:51 AM  _________________________________________________________________   (provider comments below)

## 2019-12-24 DIAGNOSIS — Z1159 Encounter for screening for other viral diseases: Secondary | ICD-10-CM | POA: Diagnosis not present

## 2019-12-28 DIAGNOSIS — K5730 Diverticulosis of large intestine without perforation or abscess without bleeding: Secondary | ICD-10-CM | POA: Diagnosis not present

## 2019-12-28 DIAGNOSIS — D122 Benign neoplasm of ascending colon: Secondary | ICD-10-CM | POA: Diagnosis not present

## 2019-12-28 DIAGNOSIS — D120 Benign neoplasm of cecum: Secondary | ICD-10-CM | POA: Diagnosis not present

## 2019-12-28 DIAGNOSIS — R1310 Dysphagia, unspecified: Secondary | ICD-10-CM | POA: Diagnosis not present

## 2019-12-28 DIAGNOSIS — K5520 Angiodysplasia of colon without hemorrhage: Secondary | ICD-10-CM | POA: Diagnosis not present

## 2019-12-28 DIAGNOSIS — K449 Diaphragmatic hernia without obstruction or gangrene: Secondary | ICD-10-CM | POA: Diagnosis not present

## 2019-12-28 DIAGNOSIS — D125 Benign neoplasm of sigmoid colon: Secondary | ICD-10-CM | POA: Diagnosis not present

## 2019-12-28 DIAGNOSIS — Z980 Intestinal bypass and anastomosis status: Secondary | ICD-10-CM | POA: Diagnosis not present

## 2019-12-28 DIAGNOSIS — R195 Other fecal abnormalities: Secondary | ICD-10-CM | POA: Diagnosis not present

## 2019-12-28 DIAGNOSIS — K621 Rectal polyp: Secondary | ICD-10-CM | POA: Diagnosis not present

## 2019-12-30 DIAGNOSIS — D120 Benign neoplasm of cecum: Secondary | ICD-10-CM | POA: Diagnosis not present

## 2019-12-30 DIAGNOSIS — D125 Benign neoplasm of sigmoid colon: Secondary | ICD-10-CM | POA: Diagnosis not present

## 2019-12-30 DIAGNOSIS — K621 Rectal polyp: Secondary | ICD-10-CM | POA: Diagnosis not present

## 2019-12-30 DIAGNOSIS — D122 Benign neoplasm of ascending colon: Secondary | ICD-10-CM | POA: Diagnosis not present

## 2020-01-07 DIAGNOSIS — E118 Type 2 diabetes mellitus with unspecified complications: Secondary | ICD-10-CM | POA: Diagnosis not present

## 2020-01-07 DIAGNOSIS — Z681 Body mass index (BMI) 19 or less, adult: Secondary | ICD-10-CM | POA: Diagnosis not present

## 2020-01-07 DIAGNOSIS — Z23 Encounter for immunization: Secondary | ICD-10-CM | POA: Diagnosis not present

## 2020-01-07 DIAGNOSIS — E785 Hyperlipidemia, unspecified: Secondary | ICD-10-CM | POA: Diagnosis not present

## 2020-01-07 DIAGNOSIS — J449 Chronic obstructive pulmonary disease, unspecified: Secondary | ICD-10-CM | POA: Diagnosis not present

## 2020-01-07 DIAGNOSIS — Z1331 Encounter for screening for depression: Secondary | ICD-10-CM | POA: Diagnosis not present

## 2020-01-07 DIAGNOSIS — I119 Hypertensive heart disease without heart failure: Secondary | ICD-10-CM | POA: Diagnosis not present

## 2020-01-07 DIAGNOSIS — D649 Anemia, unspecified: Secondary | ICD-10-CM | POA: Diagnosis not present

## 2020-01-07 DIAGNOSIS — I25118 Atherosclerotic heart disease of native coronary artery with other forms of angina pectoris: Secondary | ICD-10-CM | POA: Diagnosis not present

## 2020-01-07 DIAGNOSIS — Z9181 History of falling: Secondary | ICD-10-CM | POA: Diagnosis not present

## 2020-01-07 DIAGNOSIS — Z79899 Other long term (current) drug therapy: Secondary | ICD-10-CM | POA: Diagnosis not present

## 2020-01-07 DIAGNOSIS — Z139 Encounter for screening, unspecified: Secondary | ICD-10-CM | POA: Diagnosis not present

## 2020-02-01 DIAGNOSIS — Z23 Encounter for immunization: Secondary | ICD-10-CM | POA: Diagnosis not present

## 2020-03-02 DIAGNOSIS — N200 Calculus of kidney: Secondary | ICD-10-CM | POA: Diagnosis not present

## 2020-03-02 DIAGNOSIS — R351 Nocturia: Secondary | ICD-10-CM | POA: Diagnosis not present

## 2020-03-02 DIAGNOSIS — N401 Enlarged prostate with lower urinary tract symptoms: Secondary | ICD-10-CM | POA: Diagnosis not present

## 2020-04-07 DIAGNOSIS — I700 Atherosclerosis of aorta: Secondary | ICD-10-CM | POA: Diagnosis not present

## 2020-04-07 DIAGNOSIS — J449 Chronic obstructive pulmonary disease, unspecified: Secondary | ICD-10-CM | POA: Diagnosis not present

## 2020-04-07 DIAGNOSIS — E785 Hyperlipidemia, unspecified: Secondary | ICD-10-CM | POA: Diagnosis not present

## 2020-04-07 DIAGNOSIS — I25118 Atherosclerotic heart disease of native coronary artery with other forms of angina pectoris: Secondary | ICD-10-CM | POA: Diagnosis not present

## 2020-04-07 DIAGNOSIS — E118 Type 2 diabetes mellitus with unspecified complications: Secondary | ICD-10-CM | POA: Diagnosis not present

## 2020-04-07 DIAGNOSIS — Z79899 Other long term (current) drug therapy: Secondary | ICD-10-CM | POA: Diagnosis not present

## 2020-04-07 DIAGNOSIS — F419 Anxiety disorder, unspecified: Secondary | ICD-10-CM | POA: Diagnosis not present

## 2020-07-07 DIAGNOSIS — Z79899 Other long term (current) drug therapy: Secondary | ICD-10-CM | POA: Diagnosis not present

## 2020-07-07 DIAGNOSIS — I739 Peripheral vascular disease, unspecified: Secondary | ICD-10-CM | POA: Diagnosis not present

## 2020-07-07 DIAGNOSIS — Z681 Body mass index (BMI) 19 or less, adult: Secondary | ICD-10-CM | POA: Diagnosis not present

## 2020-07-07 DIAGNOSIS — D649 Anemia, unspecified: Secondary | ICD-10-CM | POA: Diagnosis not present

## 2020-07-07 DIAGNOSIS — I700 Atherosclerosis of aorta: Secondary | ICD-10-CM | POA: Diagnosis not present

## 2020-07-07 DIAGNOSIS — E118 Type 2 diabetes mellitus with unspecified complications: Secondary | ICD-10-CM | POA: Diagnosis not present

## 2020-07-07 DIAGNOSIS — F419 Anxiety disorder, unspecified: Secondary | ICD-10-CM | POA: Diagnosis not present

## 2020-07-07 DIAGNOSIS — E785 Hyperlipidemia, unspecified: Secondary | ICD-10-CM | POA: Diagnosis not present

## 2020-07-07 DIAGNOSIS — I119 Hypertensive heart disease without heart failure: Secondary | ICD-10-CM | POA: Diagnosis not present

## 2020-08-08 NOTE — Progress Notes (Signed)
Cardiology Office Note:    Date:  08/09/2020   ID:  [name], DOB 1940/09/27, MRN [id number]  PCP:  Janine Limbo, PA-C  Cardiologist:  Fransico Him, MD    Referring MD: Janine Limbo, PA-C   Chief Complaint  Patient presents with  . Follow-up    Carotid artery stenosis, HTN, HLD, PVD, bradycardia    History of Present Illness:    [name] is a 80 y.o. male with a hx of subclavian steal syndrome, PAD,DM, HTN, hyperlipidemiaand diabetes. He had acath in 2002 with minimal disease. He has left subclavian stenosis with evidence for steal by doppler imaging, last dopplers done in 9/15. Peripheral arterial dopplers in 9/16 showed bilateral L>R SFA disease. He has had significant claudication. He was seen by Dr Fletcher Anon who recommended angiography, but he did not want to have the procedure done. He was started on cilostazol but this caused abdominal pain.  He is here today for followup and is doing well.  He denies any chest pain or pressure, PND, orthopnea, LE edema, dizziness, palpitations or syncope.  He has chronic DOE but it is stable and does not stop him from walking.  He says the SOB has not changed any. He denies any claudication in his left arm.  He walks 2 miles daily without leg pain.  He is compliant with his meds and is tolerating meds with no SE.    Past Medical History:  Diagnosis Date  . Abscess of abdominal wall 11/28/2012  . Bladder cancer ([place])   . CAD (coronary artery disease)    Mild per cath in 2002/last nuclear stress per note  2010  . Carotid artery stenosis    Stable bilateral mild carotid artery disease with 1-39% stenosis and normal right vertebral flow but retrograde left vertebral flow c/w known subclavian stenosis by dopplers 08/2019  . Colonic diverticular abscess s/p perc drainage x2 11/10/2012  . Colonoscopy refused    Sister died from delayed Dx of post-colonoscopy   . Diabetes mellitus   . Diverticulitis 2683-4196   "all my life"  .  Hyperlipidemia    Is on statin therapy  . Hypertension    LOV  Dr Aundra Dubin with clearance 05/08/11 and EKG  in EPIC   . Normal nuclear stress test 2010   EF 63% with no ischemia  . PVD (peripheral vascular disease) ([place]) 08/12/2017  . Skin cancer   . Subclavian steal syndrome    left; evaluated by Dr. Irish Lack; managed medically , no accurate B/p left arm. Dopplers 08/2017 - Stable bilateral mild carotid artery disease with 1-39% stenosis and normal right vertebral flow but retrograde left vertebral flow c/w known subclavian stenosis    Past Surgical History:  Procedure Laterality Date  . APPENDECTOMY    . CARDIAC CATHETERIZATION  2002   He had a which showed mild coronary atherosclerosis and normal left ventricular function.  Marland Kitchen CATARACT EXTRACTION, BILATERAL Bilateral 01-23-13   11'13  . CYSTOSCOPY W/ URETERAL STENT PLACEMENT Bilateral 01/29/2013   Procedure: CYSTOSCOPY WITH BILATERAL RETROGRADE PYELOGRAM/BILATERAL URETERAL STENT PLACEMENT;  Surgeon: Fredricka Bonine, MD;  Location: [facility];  Service: Urology;  Laterality: Bilateral;  . CYSTOSCOPY WITH INJECTION     cystoscopy with BCG injections  . FISTULOTOMY N/A 01/29/2013   Procedure: repair colovesical fustula ;  Surgeon: Adin Hector, MD;  Location: [facility];  Service: General;  Laterality: N/A;  . LAPAROSCOPIC SIGMOID COLECTOMY N/A 01/29/2013   Procedure: SPLENIC FLEXOR MOBILIZATION,LAPAROSCOPIC SIGMOID COLECTOMY, LYSIS  OF ADHESIONS, OMENTOPEXY, RIGID PROCTOSCOPY;  Surgeon: Adin Hector, MD;  Location: [facility];  Service: General;  Laterality: N/A;  . PERITONEAL WOUND DRAINAGE  01-23-13   implant of abdomianl drain  . ROTATOR CUFF REPAIR     right  . TONSILLECTOMY    . TRANSURETHRAL RESECTION OF PROSTATE  05/17/2011   Procedure: TRANSURETHRAL RESECTION OF THE PROSTATE WITH GYRUS INSTRUMENTS;  Surgeon: Malka So, MD;  Location: [facility];  Service: Urology;  Laterality: N/A;    Current Medications: Current Meds   Medication Sig  . aspirin 81 MG tablet Take 81 mg by mouth daily.  . busPIRone (BUSPAR) 15 MG tablet Take 15 mg by mouth 2 (two) times daily.  . fish oil-omega-3 fatty acids 1000 MG capsule Take 1 g by mouth daily.   Marland Kitchen glipiZIDE (GLUCOTROL) 5 MG tablet Take 2.5 mg by mouth daily before breakfast.  . hydrALAZINE (APRESOLINE) 50 MG tablet Take 0.5 tablets (25 mg total) by mouth 3 (three) times daily. Please keep upcoming appt in April with Dr. Radford Pax before anymore refills. Thank you  . losartan (COZAAR) 100 MG tablet Take 1 tablet (100 mg total) by mouth daily.  . metoprolol tartrate (LOPRESSOR) 50 MG tablet Take 0.5 tablets (25 mg total) by mouth 2 (two) times daily. Please keep upcoming appt in April with Dr. Radford Pax before anymore refills. Thank you  . Multiple Vitamin (MULTIVITAMIN) tablet Take 1 tablet by mouth daily.  . simvastatin (ZOCOR) 40 MG tablet TAKE 1 TABLET BY MOUTH IN  THE EVENING  . SYMBICORT 160-4.5 MCG/ACT inhaler Inhale into the lungs.  . tamsulosin (FLOMAX) 0.4 MG CAPS capsule Take 0.4 mg by mouth daily.     Allergies:   Dilaudid [hydromorphone hcl], Hydromorphone, Other, and Wasp venom   Social History   Socioeconomic History  . Marital status: Married    Spouse name: Not on file  . Number of children: Not on file  . Years of education: Not on file  . Highest education level: Not on file  Occupational History  . Not on file  Tobacco Use  . Smoking status: Current Every Day Smoker    Years: 55.00    Types: Pipe    Last attempt to quit: 05/14/1955    Years since quitting: 65.2  . Smokeless tobacco: Former Network engineer and Sexual Activity  . Alcohol use: No  . Drug use: No  . Sexual activity: Yes  Other Topics Concern  . Not on file  Social History Narrative  . Not on file   Social Determinants of Health   Financial Resource Strain: Not on file  Food Insecurity: Not on file  Transportation Needs: Not on file  Physical Activity: Not on file  Stress:  Not on file  Social Connections: Not on file     Family History: The patient's family history includes COPD in his father; Hypertension in his mother; Lung cancer in his mother.  ROS:   Please see the history of present illness.    ROS  All other systems reviewed and negative.   EKGs/Labs/Other Studies Reviewed:    The following studies were reviewed today: none  EKG:  EKG is  ordered today.  The ekg ordered today demonstrates NSR with PACs and iRBBB  Recent Labs: No results found for requested labs within last 8760 hours.   Recent Lipid Panel    Component Value Date/Time   CHOL 110 (L) 12/31/2014 1638   TRIG 152 (H) 12/31/2014 1638  HDL 41 12/31/2014 1638   CHOLHDL 2.7 12/31/2014 1638   VLDL 30 12/31/2014 1638   LDLCALC 39 12/31/2014 1638    Physical Exam:    VS:  BP 110/60   Pulse 66   Ht 6\' 2"  (1.88 m)   Wt 150 lb 9.6 oz (68.3 kg)   BMI 19.34 kg/m     Wt Readings from Last 3 Encounters:  08/09/20 150 lb 9.6 oz (68.3 kg)  08/13/19 154 lb 6.4 oz (70 kg)  07/18/19 155 lb (70.3 kg)     GEN: Well nourished, well developed in no acute distress HEENT: Normal NECK: No JVD; No carotid bruits LYMPHATICS: No lymphadenopathy CARDIAC:RRR, no murmurs, rubs, gallops RESPIRATORY:  Clear to auscultation without rales, wheezing or rhonchi  ABDOMEN: Soft, non-tender, non-distended MUSCULOSKELETAL:  No edema; No deformity  SKIN: Warm and dry NEUROLOGIC:  Alert and oriented x 3 PSYCHIATRIC:  Normal affect   ASSESSMENT:    1. Bilateral carotid artery stenosis   2. Primary hypertension   3. Pure hypercholesterolemia   4. PVD (peripheral vascular disease) ([name])   5. Bradycardia    PLAN:    In order of problems listed above:  1.  Carotid artery stenosis with Subclavian steel syndrome  -He has not had any arm numbness or claudication symptoms.  -His last dopplers 08/2019 showed 1-39% bilateral stenosis and < 50% stenosis in the CCA bilaterally and stenotic left  subclavian artery with steal.   -continue ASA and statin -repeat carotid dopplers in 08/2020  2.  HTN  -BP is well controlled on exam today -continue Lopressor 25mg  BID, Losartan 100mg  daily and Hydralazine 25mg  TID -SCr stable at 1.12 and K+ 4.6 in march 2022  3.  Hyperlipidemia -LDL goal is less than 70.   -LDL was 36 in march 2022 with normal ALT at 12 -continue Simvastatin 40mg  daily  4.   PVD  - Peripheral arterial dopplers in 9/16 showed bilateral L>R SFA disease.  -He was seen by Dr Fletcher Anon who recommended angiography, but he did not want to have the procedure done.  -He was tried on Cilastazol but did no tolerate due to abdominal pain.   -He continues with his walking program walking 2 miles daily with no claudication symptoms  5.  Bradycardia -this is secondary to his beta-blocker and has resolved   Medication Adjustments/Labs and Tests Ordered: Current medicines are reviewed at length with the patient today.  Concerns regarding medicines are outlined above.  Orders Placed This Encounter  Procedures  . EKG 12-Lead   No orders of the defined types were placed in this encounter.   Signed, Fransico Him, MD  08/09/2020 8:35 AM    [facility]

## 2020-08-09 ENCOUNTER — Other Ambulatory Visit: Payer: Self-pay

## 2020-08-09 ENCOUNTER — Ambulatory Visit (INDEPENDENT_AMBULATORY_CARE_PROVIDER_SITE_OTHER): Payer: Medicare Other | Admitting: Cardiology

## 2020-08-09 ENCOUNTER — Encounter: Payer: Self-pay | Admitting: Cardiology

## 2020-08-09 VITALS — BP 110/60 | HR 66 | Ht 74.0 in | Wt 150.6 lb

## 2020-08-09 DIAGNOSIS — E7800 Pure hypercholesterolemia, unspecified: Secondary | ICD-10-CM | POA: Diagnosis not present

## 2020-08-09 DIAGNOSIS — R001 Bradycardia, unspecified: Secondary | ICD-10-CM

## 2020-08-09 DIAGNOSIS — I10 Essential (primary) hypertension: Secondary | ICD-10-CM

## 2020-08-09 DIAGNOSIS — I6523 Occlusion and stenosis of bilateral carotid arteries: Secondary | ICD-10-CM

## 2020-08-09 DIAGNOSIS — I739 Peripheral vascular disease, unspecified: Secondary | ICD-10-CM | POA: Diagnosis not present

## 2020-08-09 MED ORDER — METOPROLOL TARTRATE 50 MG PO TABS: 25.0000 mg | ORAL_TABLET | Freq: Two times a day (BID) | ORAL | 3 refills | Status: DC

## 2020-08-09 MED ORDER — LOSARTAN POTASSIUM 100 MG PO TABS: 100.0000 mg | ORAL_TABLET | Freq: Every day | ORAL | 3 refills | Status: DC

## 2020-08-09 MED ORDER — SIMVASTATIN 40 MG PO TABS: 40.0000 mg | ORAL_TABLET | Freq: Every evening | ORAL | 3 refills | Status: DC

## 2020-08-09 MED ORDER — HYDRALAZINE HCL 50 MG PO TABS: 25.0000 mg | ORAL_TABLET | Freq: Three times a day (TID) | ORAL | 3 refills | Status: DC

## 2020-08-09 NOTE — Patient Instructions (Signed)
Medication Instructions:  Your physician recommends that you continue on your current medications as directed. Please refer to the Current Medication list given to you today.  *If you need a refill on your cardiac medications before your next appointment, please call your pharmacy*  Testing/Procedures: Your physician has requested that you have a carotid duplex in May 2022. This test is an ultrasound of the carotid arteries in your neck. It looks at blood flow through these arteries that supply the brain with blood. Allow one hour for this exam. There are no restrictions or special instructions.  Follow-Up: At [facility], you and your health needs are our priority.  As part of our continuing mission to provide you with exceptional heart care, we have created designated Provider Care Teams.  These Care Teams include your primary Cardiologist (physician) and Advanced Practice Providers (APPs -  Physician Assistants and Nurse Practitioners) who all work together to provide you with the care you need, when you need it.  Your next appointment:   1 year(s)  The format for your next appointment:   In Person  Provider:   You may see Fransico Him, MD or one of the following Advanced Practice Providers on your designated Care Team:    Melina Copa, PA-C  Ermalinda Barrios, PA-C

## 2020-08-09 NOTE — Addendum Note (Signed)
Addended by: Antonieta Iba on: 08/09/2020 08:43 AM   Modules accepted: Orders

## 2020-08-25 ENCOUNTER — Ambulatory Visit (HOSPITAL_COMMUNITY)
Admission: RE | Admit: 2020-08-25 | Discharge: 2020-08-25 | Disposition: A | Discharge status: Home / Self Care | Payer: Medicare Other | Source: Ambulatory Visit | Attending: Cardiology | Admitting: Cardiology

## 2020-08-25 ENCOUNTER — Other Ambulatory Visit: Payer: Self-pay

## 2020-08-25 DIAGNOSIS — I6523 Occlusion and stenosis of bilateral carotid arteries: Secondary | ICD-10-CM | POA: Insufficient documentation

## 2020-09-06 DIAGNOSIS — Z7984 Long term (current) use of oral hypoglycemic drugs: Secondary | ICD-10-CM | POA: Diagnosis not present

## 2020-09-06 DIAGNOSIS — Z961 Presence of intraocular lens: Secondary | ICD-10-CM | POA: Diagnosis not present

## 2020-09-06 DIAGNOSIS — E119 Type 2 diabetes mellitus without complications: Secondary | ICD-10-CM | POA: Diagnosis not present

## 2020-09-23 DIAGNOSIS — J449 Chronic obstructive pulmonary disease, unspecified: Secondary | ICD-10-CM | POA: Diagnosis not present

## 2020-09-23 DIAGNOSIS — F419 Anxiety disorder, unspecified: Secondary | ICD-10-CM | POA: Diagnosis not present

## 2020-09-23 DIAGNOSIS — E118 Type 2 diabetes mellitus with unspecified complications: Secondary | ICD-10-CM | POA: Diagnosis not present

## 2020-09-23 DIAGNOSIS — I25118 Atherosclerotic heart disease of native coronary artery with other forms of angina pectoris: Secondary | ICD-10-CM | POA: Diagnosis not present

## 2020-09-23 DIAGNOSIS — I119 Hypertensive heart disease without heart failure: Secondary | ICD-10-CM | POA: Diagnosis not present

## 2020-09-23 DIAGNOSIS — E785 Hyperlipidemia, unspecified: Secondary | ICD-10-CM | POA: Diagnosis not present

## 2020-09-23 DIAGNOSIS — Z681 Body mass index (BMI) 19 or less, adult: Secondary | ICD-10-CM | POA: Diagnosis not present

## 2020-09-23 DIAGNOSIS — Z79899 Other long term (current) drug therapy: Secondary | ICD-10-CM | POA: Diagnosis not present

## 2020-10-12 DIAGNOSIS — L570 Actinic keratosis: Secondary | ICD-10-CM | POA: Diagnosis not present

## 2020-10-12 DIAGNOSIS — D2372 Other benign neoplasm of skin of left lower limb, including hip: Secondary | ICD-10-CM | POA: Diagnosis not present

## 2020-10-12 DIAGNOSIS — L821 Other seborrheic keratosis: Secondary | ICD-10-CM | POA: Diagnosis not present

## 2020-10-12 DIAGNOSIS — Z85828 Personal history of other malignant neoplasm of skin: Secondary | ICD-10-CM | POA: Diagnosis not present

## 2020-12-27 DIAGNOSIS — D649 Anemia, unspecified: Secondary | ICD-10-CM | POA: Diagnosis not present

## 2020-12-27 DIAGNOSIS — E1169 Type 2 diabetes mellitus with other specified complication: Secondary | ICD-10-CM | POA: Diagnosis not present

## 2020-12-27 DIAGNOSIS — Z681 Body mass index (BMI) 19 or less, adult: Secondary | ICD-10-CM | POA: Diagnosis not present

## 2020-12-27 DIAGNOSIS — I119 Hypertensive heart disease without heart failure: Secondary | ICD-10-CM | POA: Diagnosis not present

## 2020-12-27 DIAGNOSIS — Z87891 Personal history of nicotine dependence: Secondary | ICD-10-CM | POA: Diagnosis not present

## 2020-12-27 DIAGNOSIS — E785 Hyperlipidemia, unspecified: Secondary | ICD-10-CM | POA: Diagnosis not present

## 2020-12-27 DIAGNOSIS — F419 Anxiety disorder, unspecified: Secondary | ICD-10-CM | POA: Diagnosis not present

## 2020-12-27 DIAGNOSIS — J449 Chronic obstructive pulmonary disease, unspecified: Secondary | ICD-10-CM | POA: Diagnosis not present

## 2020-12-27 DIAGNOSIS — Z79899 Other long term (current) drug therapy: Secondary | ICD-10-CM | POA: Diagnosis not present

## 2020-12-27 DIAGNOSIS — I25118 Atherosclerotic heart disease of native coronary artery with other forms of angina pectoris: Secondary | ICD-10-CM | POA: Diagnosis not present

## 2021-01-26 DIAGNOSIS — Z23 Encounter for immunization: Secondary | ICD-10-CM | POA: Diagnosis not present

## 2021-02-21 DIAGNOSIS — E785 Hyperlipidemia, unspecified: Secondary | ICD-10-CM | POA: Diagnosis not present

## 2021-02-21 DIAGNOSIS — Z0000 Encounter for general adult medical examination without abnormal findings: Secondary | ICD-10-CM | POA: Diagnosis not present

## 2021-02-21 DIAGNOSIS — Z1331 Encounter for screening for depression: Secondary | ICD-10-CM | POA: Diagnosis not present

## 2021-02-21 DIAGNOSIS — Z139 Encounter for screening, unspecified: Secondary | ICD-10-CM | POA: Diagnosis not present

## 2021-02-21 DIAGNOSIS — Z9181 History of falling: Secondary | ICD-10-CM | POA: Diagnosis not present

## 2021-03-01 DIAGNOSIS — N401 Enlarged prostate with lower urinary tract symptoms: Secondary | ICD-10-CM | POA: Diagnosis not present

## 2021-03-01 DIAGNOSIS — N200 Calculus of kidney: Secondary | ICD-10-CM | POA: Diagnosis not present

## 2021-03-01 DIAGNOSIS — R351 Nocturia: Secondary | ICD-10-CM | POA: Diagnosis not present

## 2021-03-01 DIAGNOSIS — R3121 Asymptomatic microscopic hematuria: Secondary | ICD-10-CM | POA: Diagnosis not present

## 2021-03-28 DIAGNOSIS — Z681 Body mass index (BMI) 19 or less, adult: Secondary | ICD-10-CM | POA: Diagnosis not present

## 2021-03-28 DIAGNOSIS — E785 Hyperlipidemia, unspecified: Secondary | ICD-10-CM | POA: Diagnosis not present

## 2021-03-28 DIAGNOSIS — I119 Hypertensive heart disease without heart failure: Secondary | ICD-10-CM | POA: Diagnosis not present

## 2021-03-28 DIAGNOSIS — F419 Anxiety disorder, unspecified: Secondary | ICD-10-CM | POA: Diagnosis not present

## 2021-03-28 DIAGNOSIS — E1169 Type 2 diabetes mellitus with other specified complication: Secondary | ICD-10-CM | POA: Diagnosis not present

## 2021-03-28 DIAGNOSIS — I25118 Atherosclerotic heart disease of native coronary artery with other forms of angina pectoris: Secondary | ICD-10-CM | POA: Diagnosis not present

## 2021-03-28 DIAGNOSIS — Z79899 Other long term (current) drug therapy: Secondary | ICD-10-CM | POA: Diagnosis not present

## 2021-03-28 DIAGNOSIS — J449 Chronic obstructive pulmonary disease, unspecified: Secondary | ICD-10-CM | POA: Diagnosis not present

## 2021-04-04 DIAGNOSIS — K5730 Diverticulosis of large intestine without perforation or abscess without bleeding: Secondary | ICD-10-CM | POA: Diagnosis not present

## 2021-04-04 DIAGNOSIS — R310 Gross hematuria: Secondary | ICD-10-CM | POA: Diagnosis not present

## 2021-04-04 DIAGNOSIS — Z87442 Personal history of urinary calculi: Secondary | ICD-10-CM | POA: Diagnosis not present

## 2021-04-04 DIAGNOSIS — Z8551 Personal history of malignant neoplasm of bladder: Secondary | ICD-10-CM | POA: Diagnosis not present

## 2021-04-04 DIAGNOSIS — N400 Enlarged prostate without lower urinary tract symptoms: Secondary | ICD-10-CM | POA: Diagnosis not present

## 2021-04-04 DIAGNOSIS — K7689 Other specified diseases of liver: Secondary | ICD-10-CM | POA: Diagnosis not present

## 2021-04-18 DIAGNOSIS — R310 Gross hematuria: Secondary | ICD-10-CM | POA: Diagnosis not present

## 2021-06-21 ENCOUNTER — Other Ambulatory Visit: Payer: Self-pay | Admitting: Cardiology

## 2021-06-27 DIAGNOSIS — Z79899 Other long term (current) drug therapy: Secondary | ICD-10-CM | POA: Diagnosis not present

## 2021-06-27 DIAGNOSIS — J449 Chronic obstructive pulmonary disease, unspecified: Secondary | ICD-10-CM | POA: Diagnosis not present

## 2021-06-27 DIAGNOSIS — E785 Hyperlipidemia, unspecified: Secondary | ICD-10-CM | POA: Diagnosis not present

## 2021-06-27 DIAGNOSIS — E1169 Type 2 diabetes mellitus with other specified complication: Secondary | ICD-10-CM | POA: Diagnosis not present

## 2021-06-27 DIAGNOSIS — I739 Peripheral vascular disease, unspecified: Secondary | ICD-10-CM | POA: Diagnosis not present

## 2021-06-27 DIAGNOSIS — Z681 Body mass index (BMI) 19 or less, adult: Secondary | ICD-10-CM | POA: Diagnosis not present

## 2021-06-27 DIAGNOSIS — I700 Atherosclerosis of aorta: Secondary | ICD-10-CM | POA: Diagnosis not present

## 2021-06-27 DIAGNOSIS — F419 Anxiety disorder, unspecified: Secondary | ICD-10-CM | POA: Diagnosis not present

## 2021-07-19 DIAGNOSIS — F17210 Nicotine dependence, cigarettes, uncomplicated: Secondary | ICD-10-CM | POA: Diagnosis not present

## 2021-07-19 DIAGNOSIS — D3501 Benign neoplasm of right adrenal gland: Secondary | ICD-10-CM | POA: Diagnosis not present

## 2021-07-19 DIAGNOSIS — W19XXXA Unspecified fall, initial encounter: Secondary | ICD-10-CM | POA: Diagnosis not present

## 2021-07-19 DIAGNOSIS — I10 Essential (primary) hypertension: Secondary | ICD-10-CM | POA: Diagnosis not present

## 2021-07-19 DIAGNOSIS — M160 Bilateral primary osteoarthritis of hip: Secondary | ICD-10-CM | POA: Diagnosis not present

## 2021-07-19 DIAGNOSIS — I739 Peripheral vascular disease, unspecified: Secondary | ICD-10-CM | POA: Diagnosis not present

## 2021-07-19 DIAGNOSIS — S0001XA Abrasion of scalp, initial encounter: Secondary | ICD-10-CM | POA: Diagnosis not present

## 2021-07-19 DIAGNOSIS — G936 Cerebral edema: Secondary | ICD-10-CM | POA: Diagnosis not present

## 2021-07-19 DIAGNOSIS — R55 Syncope and collapse: Secondary | ICD-10-CM | POA: Diagnosis not present

## 2021-07-19 DIAGNOSIS — S51012A Laceration without foreign body of left elbow, initial encounter: Secondary | ICD-10-CM | POA: Diagnosis not present

## 2021-07-19 DIAGNOSIS — S199XXA Unspecified injury of neck, initial encounter: Secondary | ICD-10-CM | POA: Diagnosis not present

## 2021-07-19 DIAGNOSIS — C799 Secondary malignant neoplasm of unspecified site: Secondary | ICD-10-CM | POA: Diagnosis not present

## 2021-07-19 DIAGNOSIS — R918 Other nonspecific abnormal finding of lung field: Secondary | ICD-10-CM | POA: Diagnosis not present

## 2021-07-19 DIAGNOSIS — E119 Type 2 diabetes mellitus without complications: Secondary | ICD-10-CM | POA: Diagnosis not present

## 2021-07-19 DIAGNOSIS — S0990XA Unspecified injury of head, initial encounter: Secondary | ICD-10-CM | POA: Diagnosis not present

## 2021-07-19 DIAGNOSIS — N400 Enlarged prostate without lower urinary tract symptoms: Secondary | ICD-10-CM | POA: Diagnosis not present

## 2021-07-19 DIAGNOSIS — S0083XA Contusion of other part of head, initial encounter: Secondary | ICD-10-CM | POA: Diagnosis not present

## 2021-07-19 DIAGNOSIS — I2510 Atherosclerotic heart disease of native coronary artery without angina pectoris: Secondary | ICD-10-CM | POA: Diagnosis not present

## 2021-07-19 DIAGNOSIS — C7931 Secondary malignant neoplasm of brain: Secondary | ICD-10-CM | POA: Diagnosis not present

## 2021-07-19 DIAGNOSIS — M47812 Spondylosis without myelopathy or radiculopathy, cervical region: Secondary | ICD-10-CM | POA: Diagnosis not present

## 2021-07-19 DIAGNOSIS — R42 Dizziness and giddiness: Secondary | ICD-10-CM | POA: Diagnosis not present

## 2021-07-19 DIAGNOSIS — J432 Centrilobular emphysema: Secondary | ICD-10-CM | POA: Diagnosis not present

## 2021-07-19 DIAGNOSIS — C801 Malignant (primary) neoplasm, unspecified: Secondary | ICD-10-CM | POA: Diagnosis not present

## 2021-07-19 DIAGNOSIS — G9389 Other specified disorders of brain: Secondary | ICD-10-CM | POA: Diagnosis not present

## 2021-07-20 ENCOUNTER — Encounter (HOSPITAL_COMMUNITY): Payer: Self-pay | Admitting: Internal Medicine

## 2021-07-20 ENCOUNTER — Other Ambulatory Visit: Payer: Self-pay

## 2021-07-20 ENCOUNTER — Inpatient Hospital Stay (HOSPITAL_COMMUNITY)
Admission: AD | Admit: 2021-07-20 | Discharge: 2021-08-14 | DRG: 166 | Disposition: E | Payer: Medicare Other | Source: Other Acute Inpatient Hospital | Attending: Internal Medicine | Admitting: Internal Medicine

## 2021-07-20 ENCOUNTER — Ambulatory Visit
Admit: 2021-07-20 | Discharge: 2021-07-20 | Disposition: A | Discharge status: Home / Self Care | Payer: Medicare Other | Attending: Radiation Oncology | Admitting: Radiation Oncology

## 2021-07-20 DIAGNOSIS — Z9079 Acquired absence of other genital organ(s): Secondary | ICD-10-CM

## 2021-07-20 DIAGNOSIS — F17210 Nicotine dependence, cigarettes, uncomplicated: Secondary | ICD-10-CM | POA: Diagnosis present

## 2021-07-20 DIAGNOSIS — Z9181 History of falling: Secondary | ICD-10-CM

## 2021-07-20 DIAGNOSIS — R918 Other nonspecific abnormal finding of lung field: Principal | ICD-10-CM | POA: Diagnosis present

## 2021-07-20 DIAGNOSIS — C3412 Malignant neoplasm of upper lobe, left bronchus or lung: Secondary | ICD-10-CM | POA: Diagnosis not present

## 2021-07-20 DIAGNOSIS — E871 Hypo-osmolality and hyponatremia: Secondary | ICD-10-CM | POA: Diagnosis present

## 2021-07-20 DIAGNOSIS — D638 Anemia in other chronic diseases classified elsewhere: Secondary | ICD-10-CM | POA: Diagnosis not present

## 2021-07-20 DIAGNOSIS — G458 Other transient cerebral ischemic attacks and related syndromes: Secondary | ICD-10-CM | POA: Diagnosis not present

## 2021-07-20 DIAGNOSIS — Z85828 Personal history of other malignant neoplasm of skin: Secondary | ICD-10-CM

## 2021-07-20 DIAGNOSIS — D649 Anemia, unspecified: Secondary | ICD-10-CM | POA: Diagnosis not present

## 2021-07-20 DIAGNOSIS — Z01818 Encounter for other preprocedural examination: Secondary | ICD-10-CM | POA: Diagnosis not present

## 2021-07-20 DIAGNOSIS — C3432 Malignant neoplasm of lower lobe, left bronchus or lung: Secondary | ICD-10-CM | POA: Diagnosis not present

## 2021-07-20 DIAGNOSIS — E785 Hyperlipidemia, unspecified: Secondary | ICD-10-CM | POA: Diagnosis not present

## 2021-07-20 DIAGNOSIS — S0083XA Contusion of other part of head, initial encounter: Secondary | ICD-10-CM | POA: Diagnosis present

## 2021-07-20 DIAGNOSIS — E1151 Type 2 diabetes mellitus with diabetic peripheral angiopathy without gangrene: Secondary | ICD-10-CM | POA: Diagnosis present

## 2021-07-20 DIAGNOSIS — N179 Acute kidney failure, unspecified: Secondary | ICD-10-CM | POA: Diagnosis not present

## 2021-07-20 DIAGNOSIS — Z20822 Contact with and (suspected) exposure to covid-19: Secondary | ICD-10-CM | POA: Diagnosis present

## 2021-07-20 DIAGNOSIS — Z825 Family history of asthma and other chronic lower respiratory diseases: Secondary | ICD-10-CM

## 2021-07-20 DIAGNOSIS — R64 Cachexia: Secondary | ICD-10-CM | POA: Diagnosis present

## 2021-07-20 DIAGNOSIS — G936 Cerebral edema: Secondary | ICD-10-CM | POA: Diagnosis present

## 2021-07-20 DIAGNOSIS — C3490 Malignant neoplasm of unspecified part of unspecified bronchus or lung: Secondary | ICD-10-CM | POA: Diagnosis not present

## 2021-07-20 DIAGNOSIS — Z885 Allergy status to narcotic agent status: Secondary | ICD-10-CM

## 2021-07-20 DIAGNOSIS — Z7951 Long term (current) use of inhaled steroids: Secondary | ICD-10-CM

## 2021-07-20 DIAGNOSIS — E43 Unspecified severe protein-calorie malnutrition: Secondary | ICD-10-CM | POA: Diagnosis not present

## 2021-07-20 DIAGNOSIS — I10 Essential (primary) hypertension: Secondary | ICD-10-CM | POA: Diagnosis present

## 2021-07-20 DIAGNOSIS — E119 Type 2 diabetes mellitus without complications: Secondary | ICD-10-CM

## 2021-07-20 DIAGNOSIS — Y92009 Unspecified place in unspecified non-institutional (private) residence as the place of occurrence of the external cause: Secondary | ICD-10-CM | POA: Diagnosis not present

## 2021-07-20 DIAGNOSIS — C801 Malignant (primary) neoplasm, unspecified: Secondary | ICD-10-CM | POA: Diagnosis not present

## 2021-07-20 DIAGNOSIS — Z66 Do not resuscitate: Secondary | ICD-10-CM | POA: Diagnosis present

## 2021-07-20 DIAGNOSIS — C7931 Secondary malignant neoplasm of brain: Secondary | ICD-10-CM | POA: Diagnosis present

## 2021-07-20 DIAGNOSIS — Z681 Body mass index (BMI) 19 or less, adult: Secondary | ICD-10-CM | POA: Diagnosis not present

## 2021-07-20 DIAGNOSIS — Z7982 Long term (current) use of aspirin: Secondary | ICD-10-CM

## 2021-07-20 DIAGNOSIS — Z801 Family history of malignant neoplasm of trachea, bronchus and lung: Secondary | ICD-10-CM

## 2021-07-20 DIAGNOSIS — I2510 Atherosclerotic heart disease of native coronary artery without angina pectoris: Secondary | ICD-10-CM | POA: Diagnosis not present

## 2021-07-20 DIAGNOSIS — J432 Centrilobular emphysema: Secondary | ICD-10-CM | POA: Diagnosis not present

## 2021-07-20 DIAGNOSIS — Z8551 Personal history of malignant neoplasm of bladder: Secondary | ICD-10-CM | POA: Diagnosis not present

## 2021-07-20 DIAGNOSIS — R55 Syncope and collapse: Secondary | ICD-10-CM | POA: Diagnosis not present

## 2021-07-20 DIAGNOSIS — Z515 Encounter for palliative care: Secondary | ICD-10-CM

## 2021-07-20 DIAGNOSIS — I700 Atherosclerosis of aorta: Secondary | ICD-10-CM | POA: Diagnosis not present

## 2021-07-20 DIAGNOSIS — Z608 Other problems related to social environment: Secondary | ICD-10-CM | POA: Diagnosis present

## 2021-07-20 DIAGNOSIS — R54 Age-related physical debility: Secondary | ICD-10-CM | POA: Diagnosis present

## 2021-07-20 DIAGNOSIS — Z7984 Long term (current) use of oral hypoglycemic drugs: Secondary | ICD-10-CM

## 2021-07-20 DIAGNOSIS — W108XXA Fall (on) (from) other stairs and steps, initial encounter: Secondary | ICD-10-CM | POA: Diagnosis present

## 2021-07-20 DIAGNOSIS — Z7189 Other specified counseling: Secondary | ICD-10-CM | POA: Diagnosis not present

## 2021-07-20 DIAGNOSIS — R410 Disorientation, unspecified: Secondary | ICD-10-CM | POA: Diagnosis not present

## 2021-07-20 DIAGNOSIS — Z8249 Family history of ischemic heart disease and other diseases of the circulatory system: Secondary | ICD-10-CM

## 2021-07-20 DIAGNOSIS — R911 Solitary pulmonary nodule: Secondary | ICD-10-CM | POA: Diagnosis not present

## 2021-07-20 DIAGNOSIS — R079 Chest pain, unspecified: Secondary | ICD-10-CM | POA: Diagnosis not present

## 2021-07-20 DIAGNOSIS — Z79899 Other long term (current) drug therapy: Secondary | ICD-10-CM

## 2021-07-20 DIAGNOSIS — Z9049 Acquired absence of other specified parts of digestive tract: Secondary | ICD-10-CM

## 2021-07-20 HISTORY — DX: Other complications of anesthesia, initial encounter: T88.59XA

## 2021-07-20 LAB — GLUCOSE, CAPILLARY
Glucose-Capillary: 141 mg/dL — ABNORMAL HIGH (ref 70–99)
Glucose-Capillary: 149 mg/dL — ABNORMAL HIGH (ref 70–99)

## 2021-07-20 LAB — MRSA NEXT GEN BY PCR, NASAL: MRSA by PCR Next Gen: NOT DETECTED

## 2021-07-20 MED ORDER — TAMSULOSIN HCL 0.4 MG PO CAPS
0.4000 mg | ORAL_CAPSULE | Freq: Every day | ORAL | Status: DC
  Administered 2021-07-20 – 2021-07-28 (×8): 0.4 mg via ORAL
  Filled 2021-07-20 (×8): qty 1

## 2021-07-20 MED ORDER — SIMVASTATIN 40 MG PO TABS
40.0000 mg | ORAL_TABLET | Freq: Every evening | ORAL | Status: DC
  Administered 2021-07-20 – 2021-07-27 (×8): 40 mg via ORAL
  Filled 2021-07-20 (×3): qty 2
  Filled 2021-07-20 (×2): qty 1
  Filled 2021-07-20 (×3): qty 2

## 2021-07-20 MED ORDER — ASPIRIN EC 81 MG PO TBEC: 81.0000 mg | DELAYED_RELEASE_TABLET | Freq: Every day | ORAL | Status: DC

## 2021-07-20 MED ORDER — TRAZODONE HCL 50 MG PO TABS
50.0000 mg | ORAL_TABLET | Freq: Every evening | ORAL | Status: DC | PRN
  Administered 2021-07-21 – 2021-07-24 (×4): 50 mg via ORAL
  Filled 2021-07-20 (×4): qty 1

## 2021-07-20 MED ORDER — ONDANSETRON HCL 4 MG PO TABS: 4.0000 mg | ORAL_TABLET | Freq: Four times a day (QID) | ORAL | Status: DC | PRN

## 2021-07-20 MED ORDER — ACETAMINOPHEN 325 MG PO TABS
650.0000 mg | ORAL_TABLET | Freq: Four times a day (QID) | ORAL | Status: DC | PRN
Start: 2021-07-20 — End: 2021-07-30
  Administered 2021-07-21 – 2021-07-28 (×4): 650 mg via ORAL
  Filled 2021-07-20 (×4): qty 2

## 2021-07-20 MED ORDER — BUSPIRONE HCL 5 MG PO TABS
15.0000 mg | ORAL_TABLET | Freq: Two times a day (BID) | ORAL | Status: DC
  Administered 2021-07-20 – 2021-07-28 (×16): 15 mg via ORAL
  Filled 2021-07-20: qty 1
  Filled 2021-07-20: qty 3
  Filled 2021-07-20 (×6): qty 1
  Filled 2021-07-20: qty 3
  Filled 2021-07-20 (×4): qty 1
  Filled 2021-07-20: qty 3
  Filled 2021-07-20 (×2): qty 1

## 2021-07-20 MED ORDER — DEXAMETHASONE 4 MG PO TABS
4.0000 mg | ORAL_TABLET | Freq: Four times a day (QID) | ORAL | Status: DC
  Administered 2021-07-20 – 2021-07-28 (×29): 4 mg via ORAL
  Filled 2021-07-20 (×30): qty 1

## 2021-07-20 MED ORDER — ONDANSETRON HCL 4 MG/2ML IJ SOLN: 4.0000 mg | Freq: Four times a day (QID) | INTRAMUSCULAR | Status: DC | PRN

## 2021-07-20 MED ORDER — INSULIN ASPART 100 UNIT/ML IJ SOLN
0.0000 [IU] | Freq: Three times a day (TID) | INTRAMUSCULAR | Status: DC
  Administered 2021-07-21: 2 [IU] via SUBCUTANEOUS
  Administered 2021-07-22: 5 [IU] via SUBCUTANEOUS
  Administered 2021-07-22: 2 [IU] via SUBCUTANEOUS
  Administered 2021-07-22 – 2021-07-23 (×2): 3 [IU] via SUBCUTANEOUS
  Administered 2021-07-23: 5 [IU] via SUBCUTANEOUS
  Administered 2021-07-23: 2 [IU] via SUBCUTANEOUS
  Administered 2021-07-24: 8 [IU] via SUBCUTANEOUS
  Administered 2021-07-24: 3 [IU] via SUBCUTANEOUS
  Administered 2021-07-24: 2 [IU] via SUBCUTANEOUS
  Administered 2021-07-25 (×2): 3 [IU] via SUBCUTANEOUS
  Administered 2021-07-25: 2 [IU] via SUBCUTANEOUS
  Administered 2021-07-26 – 2021-07-27 (×4): 3 [IU] via SUBCUTANEOUS
  Administered 2021-07-28: 5 [IU] via SUBCUTANEOUS

## 2021-07-20 MED ORDER — METOPROLOL TARTRATE 25 MG PO TABS
25.0000 mg | ORAL_TABLET | Freq: Two times a day (BID) | ORAL | Status: DC
  Administered 2021-07-20 – 2021-07-28 (×13): 25 mg via ORAL
  Filled 2021-07-20 (×14): qty 1

## 2021-07-20 MED ORDER — ALBUTEROL SULFATE (2.5 MG/3ML) 0.083% IN NEBU
2.5000 mg | INHALATION_SOLUTION | RESPIRATORY_TRACT | Status: DC | PRN
  Administered 2021-07-21 – 2021-07-26 (×11): 2.5 mg via RESPIRATORY_TRACT
  Filled 2021-07-20 (×11): qty 3

## 2021-07-20 MED ORDER — LOSARTAN POTASSIUM 50 MG PO TABS
100.0000 mg | ORAL_TABLET | Freq: Every day | ORAL | Status: DC
  Administered 2021-07-20 – 2021-07-22 (×3): 100 mg via ORAL
  Filled 2021-07-20 (×3): qty 2

## 2021-07-20 MED ORDER — ACETAMINOPHEN 650 MG RE SUPP: 650.0000 mg | Freq: Four times a day (QID) | RECTAL | Status: DC | PRN

## 2021-07-20 MED ORDER — HYDRALAZINE HCL 50 MG PO TABS
50.0000 mg | ORAL_TABLET | Freq: Three times a day (TID) | ORAL | Status: DC
  Administered 2021-07-20 – 2021-07-28 (×23): 50 mg via ORAL
  Filled 2021-07-20 (×23): qty 1

## 2021-07-20 MED ORDER — OMEGA-3-ACID ETHYL ESTERS 1 G PO CAPS
1.0000 g | ORAL_CAPSULE | Freq: Every day | ORAL | Status: DC
  Administered 2021-07-21 – 2021-07-28 (×7): 1 g via ORAL
  Filled 2021-07-20 (×7): qty 1

## 2021-07-20 NOTE — Consult Note (Addendum)
Radiation Oncology         [phone] ________________________________  Initial inpatient Consultation  Name: [name] MRN: [id number]  Date of Service: 07/19/2021 DOB: [birth year]  CC:O'Buch, Greta, PA-C  No ref. provider found   REFERRING PHYSICIAN: No ref. provider found  DIAGNOSIS: 81 year old male with newly diagnosed brain metastases, felt most likely secondary to a primary bronchogenic carcinoma, pending tissue confirmation.    ICD-10-CM   1. Mass of upper lobe of left lung  R91.8 dexamethasone (DECADRON) tablet 4 mg      HISTORY OF PRESENT ILLNESS: [name] is a 81 y.o. male seen at the request of Dr. Robb Matar.  He recently presented to [facility] ED on 07/19/2021 following a fall at home.  A CT head performed on admission demonstrated multiple intracranial masses with the largest measuring 4.1 cm in the right occipital lobe with surrounding vasogenic edema and regional mass effect.  An MRI brain scan was performed for further evaluation and demonstrated at least 8 lesions, with the largest measuring 4.3 x 3.6 x 4.3 cm in the right parieto-occipital region, concerning for metastatic disease.  A CT C/A/P was performed for disease staging and identification of a primary neoplasm and this scan revealed a 6.3 cm necrotic mass in the left upper lobe, suspicious for primary bronchogenic carcinoma with associated hilar and aortopulmonary pathologic adenopathy.  The attending physician at [facility] discussed the case with neurosurgery at [facility] and they recommended oncology admission and therefore, the patient was transferred to [facility] long for expedited work-up.  He has a history of centrilobular emphysema and is a current, every day smoker.  There is a family history of small cell lung cancer in his mother.  He has been evaluated by pulmonology for consideration of bronchoscopy for tissue confirmation while he is here in the hospital.   We have been asked to consult the  patient for consideration of radiotherapy in the management of his brain disease.  PREVIOUS RADIATION THERAPY: No  PAST MEDICAL HISTORY:  Past Medical History:  Diagnosis Date   Abscess of abdominal wall 11/28/2012   Bladder cancer (HCC)    CAD (coronary artery disease)    Mild per cath in 2002/last nuclear stress per note  2010   Carotid artery stenosis    Stable bilateral mild carotid artery disease with 1-39% stenosis and normal right vertebral flow but retrograde left vertebral flow c/w known subclavian stenosis by dopplers 08/2019   Colonic diverticular abscess s/p perc drainage x2 11/10/2012   Colonoscopy refused    Sister died from delayed Dx of post-colonoscopy    Diabetes mellitus    Diverticulitis 1980-2014   "all my life"   Hyperlipidemia    Is on statin therapy   Hypertension    LOV  Dr Shirlee Latch with clearance 05/08/11 and EKG  in EPIC    Normal nuclear stress test 2010   EF 63% with no ischemia   PVD (peripheral vascular disease) (HCC) 08/12/2017   Skin cancer    Subclavian steal syndrome    left; evaluated by [name]; managed medically , no accurate B/p left arm. Dopplers 08/2017 - Stable bilateral mild carotid artery disease with 1-39% stenosis and normal right vertebral flow but retrograde left vertebral flow c/w known subclavian stenosis      PAST SURGICAL HISTORY: Past Surgical History:  Procedure Laterality Date   APPENDECTOMY     CARDIAC CATHETERIZATION  2002   He had a which showed mild coronary atherosclerosis and  normal left ventricular function.   CATARACT EXTRACTION, BILATERAL Bilateral 01-23-13   11'13   CYSTOSCOPY W/ URETERAL STENT PLACEMENT Bilateral 01/29/2013   Procedure: CYSTOSCOPY WITH BILATERAL RETROGRADE PYELOGRAM/BILATERAL URETERAL STENT PLACEMENT;  Surgeon: Antony Haste, MD;  Location: [facility];  Service: Urology;  Laterality: Bilateral;   CYSTOSCOPY WITH INJECTION     cystoscopy with BCG injections   FISTULOTOMY N/A 01/29/2013    Procedure: repair colovesical fustula ;  Surgeon: Ardeth Sportsman, MD;  Location: [facility];  Service: General;  Laterality: N/A;   LAPAROSCOPIC SIGMOID COLECTOMY N/A 01/29/2013   Procedure: SPLENIC FLEXOR MOBILIZATION,LAPAROSCOPIC SIGMOID COLECTOMY, LYSIS OF ADHESIONS, OMENTOPEXY, RIGID PROCTOSCOPY;  Surgeon: Ardeth Sportsman, MD;  Location: [facility];  Service: General;  Laterality: N/A;   PERITONEAL WOUND DRAINAGE  01-23-13   implant of abdomianl drain   ROTATOR CUFF REPAIR     right   TONSILLECTOMY     TRANSURETHRAL RESECTION OF PROSTATE  05/17/2011   Procedure: TRANSURETHRAL RESECTION OF THE PROSTATE WITH GYRUS INSTRUMENTS;  Surgeon: Anner Crete, MD;  Location[facility];  Service: Urology;  Laterality: N/A;    FAMILY HISTORY:  Family History  Problem Relation Age of Onset   Hypertension Mother    Lung cancer Mother    COPD Father     SOCIAL HISTORY:  Social History   Socioeconomic History   Marital status: Married    Spouse name: Not on file   Number of children: Not on file   Years of education: Not on file   Highest education level: Not on file  Occupational History   Not on file  Tobacco Use   Smoking status: Every Day    Types: Pipe    Last attempt to quit: 05/14/1955    Years since quitting: 66.2   Smokeless tobacco: Former  Substance and Sexual Activity   Alcohol use: No   Drug use: No   Sexual activity: Yes  Other Topics Concern   Not on file  Social History Narrative   Not on file   Social Determinants of Health   Financial Resource Strain: Not on file  Food Insecurity: Not on file  Transportation Needs: Not on file  Physical Activity: Not on file  Stress: Not on file  Social Connections: Not on file  Intimate Partner Violence: Not on file    ALLERGIES: Dilaudid [hydromorphone hcl], Hydromorphone, Other, and Wasp venom  MEDICATIONS:  Current Facility-Administered Medications  Medication Dose Route Frequency Provider Last Rate Last Admin   acetaminophen  (TYLENOL) tablet 650 mg  650 mg Oral Q6H PRN Bobette Mo, MD       Or   acetaminophen (TYLENOL) suppository 650 mg  650 mg Rectal Q6H PRN Bobette Mo, MD       albuterol (PROVENTIL) (2.5 MG/3ML) 0.083% nebulizer solution 2.5 mg  2.5 mg Nebulization Q2H PRN Bobette Mo, MD       [START ON 07/23/2021] aspirin EC tablet 81 mg  81 mg Oral Daily Bobette Mo, MD       dexamethasone (DECADRON) tablet 4 mg  4 mg Oral Q6H Bobette Mo, MD   4 mg at 07/20/21 1335   ondansetron (ZOFRAN) tablet 4 mg  4 mg Oral Q6H PRN Bobette Mo, MD       Or   ondansetron [facility]) injection 4 mg  4 mg Intravenous Q6H PRN Bobette Mo, MD        REVIEW OF SYSTEMS:  On review  of systems, the patient reports that he is doing fairly well overall.  He currently denies headaches, dizziness, imbalance, confusion or changes in his visual or auditory acuity.  He denies any chest pain, shortness of breath, cough, fevers, chills, or night sweats.  He has had some unintentional weight loss with decreased appetite over the past month.  He denies any bowel or bladder disturbances, and denies abdominal pain, nausea or vomiting.  He denies any new musculoskeletal or joint aches or pains. A complete review of systems is obtained and is otherwise negative.    PHYSICAL EXAM:  Wt Readings from Last 3 Encounters:  08/09/20 150 lb 9.6 oz (68.3 kg)  08/13/19 154 lb 6.4 oz (70 kg)  07/18/19 155 lb (70.3 kg)   Temp Readings from Last 3 Encounters:  07/20/21 97.9 F (36.6 C) (Oral)  07/18/19 98 F (36.7 C) (Oral)  02/26/13 97.3 F (36.3 C) (Temporal)   BP Readings from Last 3 Encounters:  07/20/21 (!) 152/70  08/09/20 110/60  08/13/19 140/72   Pulse Readings from Last 3 Encounters:  07/20/21 64  08/09/20 66  08/13/19 68   Pain Assessment Pain Score: 0-No pain/10 Unable to assess due to consult visit format.   KPS = 90  100 - Normal; no complaints; no evidence of  disease. 90   - Able to carry on normal activity; minor signs or symptoms of disease. 80   - Normal activity with effort; some signs or symptoms of disease. 7   - Cares for self; unable to carry on normal activity or to do active work. 60   - Requires occasional assistance, but is able to care for most of his personal needs. 50   - Requires considerable assistance and frequent medical care. 40   - Disabled; requires special care and assistance. 30   - Severely disabled; hospital admission is indicated although death not imminent. 20   - Very sick; hospital admission necessary; active supportive treatment necessary. 10   - Moribund; fatal processes progressing rapidly. 0     - Dead  Karnofsky DA, Abelmann WH, Craver LS and Burchenal [facility] [phone]) The use of the nitrogen mustards in the palliative treatment of carcinoma: with particular reference to bronchogenic carcinoma Cancer 1 [phone]  LABORATORY DATA:  Lab Results  Component Value Date   WBC 10.1 07/18/2019   HGB 13.5 07/18/2019   HCT 41.1 07/18/2019   MCV 100.2 (H) 07/18/2019   PLT 269 07/18/2019   Lab Results  Component Value Date   NA 134 (L) 07/18/2019   K 4.2 07/18/2019   CL 97 (L) 07/18/2019   CO2 29 07/18/2019   Lab Results  Component Value Date   ALT 37 11/10/2012   AST 27 11/10/2012   ALKPHOS 79 11/10/2012   BILITOT 0.4 11/10/2012     RADIOGRAPHY: No results found.    IMPRESSION/PLAN: 1. 81 y.o. male with newly diagnosed brain metastases, felt most likely secondary to a primary bronchogenic carcinoma, pending tissue confirmation.  At this point, the patient would potentially benefit from radiotherapy. The options include whole brain irradiation versus stereotactic radiosurgery. There are pros and cons associated with each of these potential treatment options. Whole brain radiotherapy would treat the known metastatic deposits and help provide some reduction of risk for future brain metastases. However, whole brain  radiotherapy carries potential risks including hair loss, subacute somnolence, and neurocognitive changes including a possible reduction in short-term memory. Whole brain radiotherapy also may carry a lower likelihood of  tumor control at the treatment sites because of the low-dose used. Stereotactic radiosurgery carries a higher likelihood for local tumor control at the targeted sites with lower associated risk for neurocognitive changes such as memory loss. However, the use of stereotactic radiosurgery in this setting may leave the patient at increased risk for new brain metastases elsewhere in the brain as high as 50-60%. Accordingly, patients who receive stereotactic radiosurgery in this setting should undergo ongoing surveillance imaging with brain MRI more frequently in order to identify and treat new small brain metastases before they become symptomatic. Stereotactic radiosurgery does carry some different risks, including a risk of radionecrosis.  I reviewed the findings and workup thus far with the patient.  We do recommend continuing dexamethasone 4 mg p.o. 4 times daily and a repeat MRI brain scan with 1 mm cuts for a more accurate assessment of the extent of disease in the brain.  We discussed the dilemma regarding whole brain radiotherapy versus stereotactic radiosurgery and the pros and cons of each. We also discussed the logistics and delivery of each. We reviewed the results associated with each of the treatments described above. The patient seems to understand the treatment options and would like to proceed with stereotactic radiosurgery if he is indeed deemed a good candidate.  We will discuss and review his case at the upcoming multidisciplinary brain conference for neurosurgery recommendations regarding possible surgical excision of the largest lesion in the parieto- occipital region.  If he is deemed a surgical candidate, we would plan for preop SRS to the parieto-occipital lesion followed by  SRS to all lesions.  If he is not felt to be a surgical candidate, we would consider fractionated SRS alone or whole brain radiation pending the number of lesions identified on the upcoming repeat MRI brain as well as final pathology.  Once we have final pathology and updated imaging, we will be back in communication with the patient regarding formal treatment recommendations at that time.  I personally spent 60 minutes in this encounter including chart review, reviewing radiological studies, telephone conversation with the patient, entering orders and completing documentation.    Marguarite Arbour, PA-C    Margaretmary Dys, MD  [facility]  Radiation Oncology Direct Dial: [phone]  Fax: [phone] [email or web address]  Skype  LinkedIn

## 2021-07-20 NOTE — H&P (Addendum)
?History and Physical  ? ? ?Patient: [name] MEQ:[id number] DOB: April 30, 1940 ?DOA: 08/03/2021 ?DOS: the patient was seen and examined on 07/19/2021 ?PCP: Janine Limbo, PA-C  ?Patient coming from: Home ? ?Chief Complaint: No chief complaint on file. ? ?HPI: [name] is a 81 y.o. male with medical history significant of abdominal wall abscess, bladder cancer, CAD, carotid artery stenosis, diverticulitis, diverticular abscess, type 2 diabetes, hyperlipidemia, hypertension, PVD, skin cancer, subclavian steal syndrome who was sent from [facility] due to his work-up revealing of left upper lobe mass and brain metastasis with surrounding edema and mass effect.  He initially presented to the emergency department after having a fall at home in his back door steps hitting his head, then falling again and injuring his LUE as associated with a brief period of vertigo sensation.  He denied nausea, tinnitus, palpitations, dyspnea or chest pain.  The case was presented to [facility] neurosurgery who stated that there was not a surgical treatment QBL for at this time.  He was given dexamethasone 10 mg and arrangements were made to transfer to this facility for further work-up. Please see [facility] transfer package for further details. ?  ?Review of Systems: As mentioned in the history of present illness. All other systems reviewed and are negative. ?Past Medical History:  ?Diagnosis Date  ? Abscess of abdominal wall 11/28/2012  ? Bladder cancer ([place])   ? CAD (coronary artery disease)   ? Mild per cath in 2002/last nuclear stress per note  2010  ? Carotid artery stenosis   ? Stable bilateral mild carotid artery disease with 1-39% stenosis and normal right vertebral flow but retrograde left vertebral flow c/w known subclavian stenosis by dopplers 08/2019  ? Colonic diverticular abscess s/p perc drainage x2 11/10/2012  ? Colonoscopy refused   ? Sister died from delayed Dx of post-colonoscopy   ? Diabetes mellitus    ? Diverticulitis [id number]  ? "all my life"  ? Hyperlipidemia   ? Is on statin therapy  ? Hypertension   ? LOV  Dr Aundra Dubin with clearance 05/08/11 and EKG  in EPIC   ? Normal nuclear stress test 2010  ? EF 63% with no ischemia  ? PVD (peripheral vascular disease) ([place]) 08/12/2017  ? Skin cancer   ? Subclavian steal syndrome   ? left; evaluated by Dr. Irish Lack; managed medically , no accurate B/p left arm. Dopplers 08/2017 - Stable bilateral mild carotid artery disease with 1-39% stenosis and normal right vertebral flow but retrograde left vertebral flow c/w known subclavian stenosis  ? ?Past Surgical History:  ?Procedure Laterality Date  ? APPENDECTOMY    ? CARDIAC CATHETERIZATION  2002  ? He had a which showed mild coronary atherosclerosis and normal left ventricular function.  ? CATARACT EXTRACTION, BILATERAL Bilateral 01-23-13  ? 11'13  ? CYSTOSCOPY W/ URETERAL STENT PLACEMENT Bilateral 01/29/2013  ? Procedure: CYSTOSCOPY WITH BILATERAL RETROGRADE PYELOGRAM/BILATERAL URETERAL STENT PLACEMENT;  Surgeon: Fredricka Bonine, MD;  Location: [facility];  Service: Urology;  Laterality: Bilateral;  ? CYSTOSCOPY WITH INJECTION    ? cystoscopy with BCG injections  ? FISTULOTOMY N/A 01/29/2013  ? Procedure: repair colovesical fustula ;  Surgeon: Adin Hector, MD;  Location: [facility];  Service: General;  Laterality: N/A;  ? LAPAROSCOPIC SIGMOID COLECTOMY N/A 01/29/2013  ? Procedure: SPLENIC FLEXOR MOBILIZATION,LAPAROSCOPIC SIGMOID COLECTOMY, LYSIS OF ADHESIONS, OMENTOPEXY, RIGID PROCTOSCOPY;  Surgeon: Adin Hector, MD;  Location: [facility];  Service: General;  Laterality: N/A;  ? PERITONEAL  WOUND DRAINAGE  01-23-13  ? implant of abdomianl drain  ? ROTATOR CUFF REPAIR    ? right  ? TONSILLECTOMY    ? TRANSURETHRAL RESECTION OF PROSTATE  05/17/2011  ? Procedure: TRANSURETHRAL RESECTION OF THE PROSTATE WITH GYRUS INSTRUMENTS;  Surgeon: Malka So, MD;  [facility];  Service: Urology;  Laterality: N/A;  ? ?Social History:   reports that he has been smoking pipe. He has quit using smokeless tobacco. He reports that he does not drink alcohol and does not use drugs. ? ?Allergies  ?Allergen Reactions  ? Dilaudid [Hydromorphone Hcl] Other (See Comments)  ?  Very sedated with Dilaudid.  Tolerates fentanyl better  ? Hydromorphone   ?  Other reaction(s): Other (See Comments) ?Very sedated with Dilaudid.  Tolerates fentanyl better  ? Other Other (See Comments)  ? Wasp Venom Other (See Comments)  ? ? ?Family History  ?Problem Relation Age of Onset  ? Hypertension Mother   ? Lung cancer Mother   ? COPD Father   ? ? ?Prior to Admission medications   ?Medication Sig Start Date End Date Taking? Authorizing Provider  ?aspirin 81 MG tablet Take 81 mg by mouth daily.    [provider]  ?busPIRone (BUSPAR) 15 MG tablet Take 15 mg by mouth 2 (two) times daily. 07/02/19   [provider]  ?fish oil-omega-3 fatty acids 1000 MG capsule Take 1 g by mouth daily.     [provider]  ?glipiZIDE (GLUCOTROL) 5 MG tablet Take 2.5 mg by mouth daily before breakfast.    [provider]  ?hydrALAZINE (APRESOLINE) 50 MG tablet TAKE ONE-HALF TABLET BY  MOUTH 3 TIMES DAILY 06/22/21   Sueanne Margarita, MD  ?losartan (COZAAR) 100 MG tablet TAKE 1 TABLET BY MOUTH  DAILY 06/22/21   Sueanne Margarita, MD  ?metoprolol tartrate (LOPRESSOR) 50 MG tablet TAKE ONE-HALF TABLET BY  MOUTH TWICE DAILY 06/22/21   Sueanne Margarita, MD  ?Multiple Vitamin (MULTIVITAMIN) tablet Take 1 tablet by mouth daily.    [provider]  ?simvastatin (ZOCOR) 40 MG tablet TAKE 1 TABLET BY MOUTH IN  THE EVENING 06/22/21   Sueanne Margarita, MD  ?[facility] 160-4.5 MCG/ACT inhaler Inhale into the lungs. 06/24/20   [provider]  ?tamsulosin (FLOMAX) 0.4 MG CAPS capsule Take 0.4 mg by mouth daily. 11/09/14   [provider]  ? ? ?Physical Exam: ?Vitals:  ? 07/19/2021 1120 07/31/2021 1206  ?BP: (!) 152/70 (!) 152/70  ?Pulse: 64 64  ?Resp: 18 18  ?Temp: 97.9 ?F  (36.6 ?C) 97.9 ?F (36.6 ?C)  ?TempSrc: Oral Oral  ?SpO2: 98%   ? ?Physical Exam ?Constitutional:   ?   Appearance: Normal appearance.  ?   Comments: Chronically ill-appearing.  ?HENT:  ?   Head: Normocephalic.  ?   Mouth/Throat:  ?   Mouth: Mucous membranes are moist.  ?Eyes:  ?   Pupils: Pupils are equal, round, and reactive to light.  ?Neck:  ?   Vascular: No JVD.  ?Cardiovascular:  ?   Rate and Rhythm: Normal rate and regular rhythm.  ?   Heart sounds: S1 normal and S2 normal.  ?Pulmonary:  ?   Effort: Pulmonary effort is normal.  ?   Breath sounds: Rhonchi present.  ?Abdominal:  ?   General: There is no distension.  ?   Palpations: Abdomen is soft.  ?   Tenderness: There is no abdominal tenderness.  ?Musculoskeletal:  ?  Cervical back: Neck supple.  ?   Right lower leg: No edema.  ?   Left lower leg: No edema.  ?   Comments: Mild generalized weakness.  ?Skin: ?   General: Skin is warm and dry.  ?   Coloration: Skin is not jaundiced.  ?   Findings: Ecchymosis present.  ?   Comments: Left frontal area and LUE.  ?Neurological:  ?   General: No focal deficit present.  ?   Mental Status: He is alert and oriented to person, place, and time.  ?Psychiatric:     ?   Mood and Affect: Mood normal.     ?   Behavior: Behavior normal.  ? ?Data Reviewed: ? ?There are no new results to review at this time. ? ?Assessment and Plan: ?Principal Problem: ?  Mass of upper lobe of left lung ?With ?  Metastasis to brain [facility]) ?Observation/telemetry. ?Continue dexamethasone 4 mg p.o. q 6 hr. ?Oncology consult appreciated. ?Pulmonology consult appreciated. ?Radiation oncology consult appreciated. ?If possible, bronchoscopy in AM. ? ?Active Problems: ?  CAD (coronary artery disease) ?Asymptomatic. ?Continue aspirin, statin and beta-blocker. ? ?  Hyperlipidemia ?On facials and simvastatin. ? ?  HTN (hypertension) ?Continue metoprolol 50 mg p.o. twice daily. ? ?  Type 2 diabetes mellitus ([place]) ?Carbohydrate modified diet. ?CBG monitoring  with RI SS. ?Check hemoglobin A1c. ? ?  Normocytic anemia ?In the setting of lung cancer. ?Monitor hematocrit and hemoglobin. ? ?  Hyponatremia ?Minimally decreased. ?Monitor sodium level. ?Consider f

## 2021-07-20 NOTE — Plan of Care (Signed)
?  Interdisciplinary Goals of Care Family Meeting ? ? ?Date carried out: 07/16/2021 ? ?Location of the meeting: Bedside ? ?Member's involved: Physician, Nurse Practitioner, and Family Member or next of kin ? ?Durable Power of Tour manager: wife    ? ?Discussion: We discussed goals of care for [name] .  Discussed code status for patient. He was very clear in not wanting resuscitative efforts in event of arrest and wishes for code status to be DNR. Wife confirms this. Joined by Dr. Lorenso Courier and this was again confirmed.  ? ?Code status: Full DNR ? ?Disposition: Continue current acute care ? ?Time spent for the meeting: 15 min ? ? ? ?Cristal Generous, NP ? ?08/08/2021, 3:31 PM ? ? ?

## 2021-07-20 NOTE — Consult Note (Signed)
[place]  ?[phone] [phone]  ? ?MEDICAL ONCOLOGY - INITIAL CONSULTATION ? ?Referral MD: Dr. Reubin Milan ? ?Reason for Referral: Lung mass, brain mets ? ?HPI: [name] is an 81 year old male with a past medical history significant for a history of hypertension, CAD, diet-controlled diabetes mellitus, diverticulitis status post sigmoid colectomy, history of bladder cancer treated with BCG.  The patient presented to [facility] following a fall.  He hit his head.  Then when trying to get back up, he fell a second time and fell on his left arm.  He was brought to the emergency department at [facility] and underwent imaging.  He had a CT of the chest/abdomen/pelvis which showed a necrotic mass in the left upper lobe measuring 6.3 cm suspicious for primary bronchogenic neoplasm with associated left hilar and aortopulmonary pathologic adenopathy.  He also had an MRI of the brain performed which showed numerous enhancing supra and infratentorial lesions concerning for metastatic disease.  The lesions demonstrated peripheral edema can cause mass effect particularly on the occipital horn of the right lateral ventricle without hydrocephalus or evidence of entrapment.  There was no significant midline shift.  Outside hospital discussed case with neurosurgery at [facility] and they recommended oncology admission.  His case was discussed with on-call medical oncologist here at [facility] and they advised admission to [facility] for work-up and evaluation by radiation oncology. ? ?Today, the patient reports that he is feeling better.  He is not currently having any headaches, dizziness, vision changes.  His wife reports that his appetite has been poor and she thinks that he has lost weight.  He is not having any chest pain, shortness of breath, cough, hemoptysis.  Denies abdominal pain, nausea, vomiting.  However, he reports that his stomach felt "funny" a few  days ago but he cannot further describe this symptom.  He is not having any constipation or diarrhea.  He has asked several times if he can just go home.  Does not want to stay in the hospital. The patient is married.  He has a daughter.  He has smoked cigarettes and pipes since at least his teens -currently smokes about 5 cigarettes/day.  Has not had any alcohol in about 40 years.  Family history significant for a mother who died from lung cancer.  Medical oncology was asked to see the patient to make recommendations regarding his abnormal imaging findings. ? ?Past Medical History:  ?Diagnosis Date  ? Abscess of abdominal wall 11/28/2012  ? Bladder cancer ([place])   ? CAD (coronary artery disease)   ? Mild per cath in 2002/last nuclear stress per note  2010  ? Carotid artery stenosis   ? Stable bilateral mild carotid artery disease with 1-39% stenosis and normal right vertebral flow but retrograde left vertebral flow c/w known subclavian stenosis by dopplers 08/2019  ? Colonic diverticular abscess s/p perc drainage x2 11/10/2012  ? Colonoscopy refused   ? Sister died from delayed Dx of post-colonoscopy   ? Diabetes mellitus   ? Diverticulitis [id number]  ? "all my life"  ? Hyperlipidemia   ? Is on statin therapy  ? Hypertension   ? LOV  Dr Aundra Dubin with clearance 05/08/11 and EKG  in [facility]   ? Normal nuclear stress test 2010  ? EF 63% with no ischemia  ? PVD (peripheral vascular disease) ([place]) 08/12/2017  ? Skin cancer   ? Subclavian steal syndrome   ? left; evaluated by Dr.  [place]; managed medically , no accurate B/p left arm. Dopplers 08/2017 - Stable bilateral mild carotid artery disease with 1-39% stenosis and normal right vertebral flow but retrograde left vertebral flow c/w known subclavian stenosis  ?: ? ? ?Past Surgical History:  ?Procedure Laterality Date  ? APPENDECTOMY    ? CARDIAC CATHETERIZATION  2002  ? He had a which showed mild coronary atherosclerosis and normal left ventricular function.  ? CATARACT  EXTRACTION, BILATERAL Bilateral 01-23-13  ? 11'13  ? CYSTOSCOPY W/ URETERAL STENT PLACEMENT Bilateral 01/29/2013  ? Procedure: CYSTOSCOPY WITH BILATERAL RETROGRADE PYELOGRAM/BILATERAL URETERAL STENT PLACEMENT;  Surgeon: Fredricka Bonine, MD;  Location: [facility];  Service: Urology;  Laterality: Bilateral;  ? CYSTOSCOPY WITH INJECTION    ? cystoscopy with BCG injections  ? FISTULOTOMY N/A 01/29/2013  ? Procedure: repair colovesical fustula ;  Surgeon: Adin Hector, MD;  Location: [facility];  Service: General;  Laterality: N/A;  ? LAPAROSCOPIC SIGMOID COLECTOMY N/A 01/29/2013  ? Procedure: SPLENIC FLEXOR MOBILIZATION,LAPAROSCOPIC SIGMOID COLECTOMY, LYSIS OF ADHESIONS, OMENTOPEXY, RIGID PROCTOSCOPY;  Surgeon: Adin Hector, MD;  Location: [facility];  Service: General;  Laterality: N/A;  ? PERITONEAL WOUND DRAINAGE  01-23-13  ? implant of abdomianl drain  ? ROTATOR CUFF REPAIR    ? right  ? TONSILLECTOMY    ? TRANSURETHRAL RESECTION OF PROSTATE  05/17/2011  ? Procedure: TRANSURETHRAL RESECTION OF THE PROSTATE WITH GYRUS INSTRUMENTS;  Surgeon: Malka So, MD;  Location: [facility];  Service: Urology;  Laterality: N/A;  ?: ? ? ?Current Facility-Administered Medications  ?Medication Dose Route Frequency Provider Last Rate Last Admin  ? acetaminophen (TYLENOL) tablet 650 mg  650 mg Oral Q6H PRN Reubin Milan, MD      ? Or  ? acetaminophen (TYLENOL) suppository 650 mg  650 mg Rectal Q6H PRN Reubin Milan, MD      ? ondansetron [facility]) tablet 4 mg  4 mg Oral Q6H PRN Reubin Milan, MD      ? Or  ? ondansetron [facility]) injection 4 mg  4 mg Intravenous Q6H PRN Reubin Milan, MD      ? ? ? ? ?Allergies  ?Allergen Reactions  ? Dilaudid [Hydromorphone Hcl] Other (See Comments)  ?  Very sedated with Dilaudid.  Tolerates fentanyl better  ? Hydromorphone   ?  Other reaction(s): Other (See Comments) ?Very sedated with Dilaudid.  Tolerates fentanyl better  ? Other Other (See Comments)  ? Wasp Venom Other (See  Comments)  ?: ? ? ?Family History  ?Problem Relation Age of Onset  ? Hypertension Mother   ? Lung cancer Mother   ? COPD Father   ?: ? ? ?Social History  ? ?Socioeconomic History  ? Marital status: Married  ?  Spouse name: Not on file  ? Number of children: Not on file  ? Years of education: Not on file  ? Highest education level: Not on file  ?Occupational History  ? Not on file  ?Tobacco Use  ? Smoking status: Every Day  ?  Types: Pipe  ?  Last attempt to quit: 05/14/1955  ?  Years since quitting: 51.2  ? Smokeless tobacco: Former  ?Substance and Sexual Activity  ? Alcohol use: No  ? Drug use: No  ? Sexual activity: Yes  ?Other Topics Concern  ? Not on file  ?Social History Narrative  ? Not on file  ? ?Social Determinants of Health  ? ?Financial Resource Strain: Not on file  ?Food  Insecurity: Not on file  ?Transportation Needs: Not on file  ?Physical Activity: Not on file  ?Stress: Not on file  ?Social Connections: Not on file  ?Intimate Partner Violence: Not on file  ?: ? ?Review of Systems: A comprehensive 14 point review of systems was negative except as noted in the HPI. ? ?Exam: ?Patient Vitals for the past 24 hrs: ? BP Temp Temp src Pulse Resp SpO2  ?07/18/2021 1206 (!) 152/70 97.9 ?F (36.6 ?C) Oral 64 18 --  ?08/05/2021 1120 (!) 152/70 97.9 ?F (36.6 ?C) Oral 64 18 98 %  ? ? ?General: Awake and alert, no distress. ?Eyes:  no scleral icterus.  PERRL.  ?ENT:  There were no oropharyngeal lesions.   ?Lymphatics:  Negative cervical, supraclavicular or axillary adenopathy.   ?Respiratory: lungs were clear bilaterally without wheezing or crackles.   ?Cardiovascular:  Regular rate and rhythm, S1/S2, without murmur, rub or gallop.  There was no pedal edema.   ?GI:  abdomen was soft, flat, nontender, nondistended, without organomegaly.    ?Skin: Ecchymoses to left forehead.  Scattered ecchymoses on his arms. ?Neuro exam was nonfocal. Patient was alert and oriented.   ? ?Lab Results  ?Component Value Date  ? WBC 10.1  07/18/2019  ? HGB 13.5 07/18/2019  ? HCT 41.1 07/18/2019  ? PLT 269 07/18/2019  ? GLUCOSE 138 (H) 07/18/2019  ? CHOL 110 (L) 12/31/2014  ? TRIG 152 (H) 12/31/2014  ? HDL 41 12/31/2014  ? [place] 39 12/31/2014  ? ALT 3

## 2021-07-20 NOTE — Consult Note (Addendum)
? ?NAME:  [name], MRN:  [id number], DOB:  17-Jun-1940, LOS: 1 ?ADMISSION DATE:  07/19/2021, CONSULTATION DATE:  07/28/2021 ?REFERRING MD:  Curcio - heme onc , CHIEF COMPLAINT:  abnormal CT   ? ?History of Present Illness:  ?81 yo M PMH centrilobular emphysema, bladder cancer s/p BCG, CAD, HLD, HTN, L Subclavian steal syndrome who was admitted to [facility] 4/6 from [place]. Presented to [place] following a fall -- MRI brain as part of workup reportedly revealed numerous brain mets prompting CT c/a/p which reportedly revealed 6.3cm lung mass, L hilar and aortopulmonary adenopathy.  ?At bedside, pt denies HA or frequent falls. Did fall 2 days ago which led to OSH presentation. Wife feels he has had intermittent confusion, LUE weakness and shared recently his PCP dx him hyperkalemia. He has a poor PO intake in general. Smoker x 60 years.  ? ?Mom had lung cancer -- thinks it was small cell -- and died from this after undergoing chemo.  ? ? PCCM is consulted to evaluate for bronchoscopy candidacy  ?__________________________________________ ? ?Notable OSH imaging: ?OSH CT c/a/p 4/5: ?No pericardial effusion. Changes c/w PAH. Coronary artery calcifications, atherosclerotic changes within thoracic aorta.  ?L hilar and aortopulmonary lymphadenopathy -- 14 x27 mm partially necrotic node  ?Centrilobular empheysema, severe. Necrotic appearing LUL mass 3.4 x 6.2 cm. Scattered airway impactions, bronchial wall thickening.  ?No lytic / blastic bone lesions ?Scattered hepatic hypodensities -- likely cyst. 26mm adenoma R adrenal gland. Prostatic hypertrophy  ?PVD -- stenosis or renal artery and lower extremity arterial inflow.  ? ?OSH MRI brain 4/5:  ?Multiple enhancing lesions -- largest being 4.3 x 3.6 x 4.3 cm R parieto-occipital lesion. Other lesions involving L frontal lobe, L parieto occipital border, Posterior R frontal lobe, Posterior R temporal love, Anterior R temporal lobe, L cerebellum, inferior vermain. There is  some mass effect on occipital horn R ventricle without midline shift. Some lesions with associated hemorrhage.  ? ?Pertinent  Medical History  ?HTN ?HLD ?Bladder cancer ?L Subclavian steal syndrome  ?Diverticulosis  ? ?Significant Hospital Events: ?Including procedures, antibiotic start and stop dates in addition to other pertinent events   ?4/5 to [place] after fall. CT head concerning for masses, again seen on MRI brain. CT c/a/p with large LUL necrotic appearing lung mass ?4/6 transfer to [facility]. Onc consult. PCCM consult  ? ?Interim History / Subjective:  ?Arrives to [place] [facility] from [place]  ? ?Objective   ?Blood pressure (!) 152/70, pulse 64, temperature 97.9 ?F (36.6 ?C), temperature source Oral, resp. rate 18, SpO2 98 %. ?   ?   ?No intake or output data in the 24 hours ending 08/01/2021 1345 ?There were no vitals filed for this visit. ? ?Examination: ?General: Chronically ill thin elderly M reclined in bed NAD  ?HENT: L forehead bruising and local edema  ?Lungs: Coarse. Even unlabored on RA. Nonproductive cough  ?Cardiovascular: rrr s1s2  ?Abdomen: thin soft ndnt  ?Extremities: no acute deformity. No cyanosis or clubbing  ?Neuro: AAOx3 ?GU: defer  ? ?Resolved Hospital Problem list   ? ? ?Assessment & Plan:  ? ?LUL 6.3 cm mass ?-concerning for primary bronchogenic neoplasm ?L hilar adenopathy ?Aortopulmonary adenopathy  ?Severe centrilobular emphysema  ?Tobacco use disorder  ?P ?-onc consult pending  ?-will arrange for bronch. Sounds like preference is to complete inpatient if can be done in the next few days.  ?-declines nicotine patch at present, will let us know if he feels it is needed ?-PRN albuterol  ? ?  Numerous brain meds, with some associated local edema and possible hemorrhage, but without midline shift  ?P ?-decadron  ?-onc consult pending  ? ?L subclavian steal syndrome  ?HTN ?P ?-cont ASA ?-antihypertensives per primary  ? ?Severe protein calorie malnutrition ?-very poor PO intake at home. Very  thin, decr muscle mass and fat stores. ?P ?-will place for RDN consult.  ? ?Goals of Care ?-discussed code status with pt / wife. 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  ?-will enter DNR order as such. Prior to discharge, would probably be of good utility to complete a gold form  ? ?Best Practice (right click and "Reselect all SmartList Selections" daily)  ? ?Per primary  ? ?Labs   ?CBC: ?No results for input(s): WBC, NEUTROABS, HGB, HCT, MCV, PLT in the last 168 hours. ? ?Basic Metabolic Panel: ?No results for input(s): NA, K, CL, CO2, GLUCOSE, BUN, CREATININE, CALCIUM, MG, PHOS in the last 168 hours. ?GFR: ?CrCl cannot be calculated (Patient's most recent lab result is older than the maximum 21 days allowed.). ?No results for input(s): PROCALCITON, WBC, LATICACIDVEN in the last 168 hours. ? ?Liver Function Tests: ?No results for input(s): AST, ALT, ALKPHOS, BILITOT, PROT, ALBUMIN in the last 168 hours. ?No results for input(s): LIPASE, AMYLASE in the last 168 hours. ?No results for input(s): AMMONIA in the last 168 hours. ? ?ABG ?   ?Component Value Date/Time  ? PHART 7.301 (L) 01/29/2013 2025  ? PCO2ART 45.4 (H) 01/29/2013 2025  ? PO2ART 139.0 (H) 01/29/2013 2025  ? HCO3 21.7 01/29/2013 2025  ? TCO2 19.7 01/29/2013 2025  ? ACIDBASEDEF 4.2 (H) 01/29/2013 2025  ? O2SAT 98.5 01/29/2013 2025  ?  ? ?Coagulation Profile: ?No results for input(s): INR, PROTIME in the last 168 hours. ? ?Cardiac Enzymes: ?No results for input(s): CKTOTAL, CKMB, CKMBINDEX, TROPONINI in the last 168 hours. ? ?HbA1C: ?Hgb A1c MFr Bld  ?Date/Time Value Ref Range Status  ?01/31/2013 03:40 AM 6.2 (H) <5.7 % Final  ?  Comment:  ?  (NOTE) ?                                                                      ?According to the ADA Clinical Practice Recommendations for 2011, when ?HbA1c is used as a screening test: ? >=6.5%   Diagnostic of Diabetes Mellitus ?          (if  abnormal result is confirmed) ?5.7-6.4%   Increased risk of developing Diabetes Mellitus ?References:Diagnosis and Classification of Diabetes Mellitus,Diabetes ?RUEA,5409,81(XBJYN 1):S62-S69 and Standards of Medical Care in         ?Diabetes - 2011,Diabetes Care,2011,34 (Suppl 1):S11-S61.  ? ? ?CBG: ?No results for input(s): GLUCAP in the last 168 hours. ? ?Review of Systems:   ?Review of Systems  ?Constitutional:  Positive for weight loss.  ?HENT: Negative.    ?Eyes: Negative.   ?Respiratory:  Positive for cough. Negative for sputum production, shortness of breath and wheezing.   ?Cardiovascular: Negative.   ?Gastrointestinal: Negative.   ?Genitourinary: Negative.   ?Musculoskeletal: Negative.   ?Skin: Negative.   ?Neurological:  Positive for focal weakness and weakness. Negative for sensory change, speech change  and headaches.  ?Endo/Heme/Allergies:  Bruises/bleeds easily.  ?Psychiatric/Behavioral: Negative.    ? ? ?Past Medical History:  ?He,  has a past medical history of Abscess of abdominal wall (11/28/2012), Bladder cancer ([place]), CAD (coronary artery disease), Carotid artery stenosis, Colonic diverticular abscess s/p perc drainage x2 (11/10/2012), Colonoscopy refused, Diabetes mellitus, Diverticulitis ([id number]), Hyperlipidemia, Hypertension, Normal nuclear stress test (2010), PVD (peripheral vascular disease) ([place]) (08/12/2017), Skin cancer, and Subclavian steal syndrome.  ? ?Surgical History:  ? ?Past Surgical History:  ?Procedure Laterality Date  ? APPENDECTOMY    ? CARDIAC CATHETERIZATION  2002  ? He had a which showed mild coronary atherosclerosis and normal left ventricular function.  ? CATARACT EXTRACTION, BILATERAL Bilateral 01-23-13  ? 11'13  ? CYSTOSCOPY W/ URETERAL STENT PLACEMENT Bilateral 01/29/2013  ? Procedure: CYSTOSCOPY WITH BILATERAL RETROGRADE PYELOGRAM/BILATERAL URETERAL STENT PLACEMENT;  Surgeon: Fredricka Bonine, MD;  Location: [facility];  Service: Urology;  Laterality: Bilateral;  ?  CYSTOSCOPY WITH INJECTION    ? cystoscopy with BCG injections  ? FISTULOTOMY N/A 01/29/2013  ? Procedure: repair colovesical fustula ;  Surgeon: Adin Hector, MD;  Location: [facility];  Service: General;  Pecola Leisure

## 2021-07-20 NOTE — Progress Notes (Signed)
Pt picked up by [facility] at 2007. Glucose checked, resulted 141. Losartan, Flomax, and Zocor given prior to leaving. Grandson at bedside.  ?

## 2021-07-20 NOTE — H&P (View-Only) (Signed)
? ?NAME:  [name], MRN:  [id number], DOB:  1940-12-21, LOS: 1 ?ADMISSION DATE:  08/06/2021, CONSULTATION DATE:  08/03/2021 ?REFERRING MD:  Curcio - heme onc , CHIEF COMPLAINT:  abnormal CT   ? ?History of Present Illness:  ?81 yo M PMH centrilobular emphysema, bladder cancer s/p BCG, CAD, HLD, HTN, L Subclavian steal syndrome who was [facility] from [place]. Presented to [place] following a fall -- MRI brain as part of workup reportedly revealed numerous brain mets prompting CT c/a/p which reportedly revealed 6.3cm lung mass, L hilar and aortopulmonary adenopathy.  ?At bedside, pt denies HA or frequent falls. Did fall 2 days ago which led to OSH presentation. Wife feels he has had intermittent confusion, LUE weakness and shared recently his PCP dx him hyperkalemia. He has a poor PO intake in general. Smoker x 60 years.  ? ?Mom had lung cancer -- thinks it was small cell -- and died from this after undergoing chemo.  ? ? PCCM is consulted to evaluate for bronchoscopy candidacy  ?__________________________________________ ? ?Notable OSH imaging: ?OSH CT c/a/p 4/5: ?No pericardial effusion. Changes c/w PAH. Coronary artery calcifications, atherosclerotic changes within thoracic aorta.  ?L hilar and aortopulmonary lymphadenopathy -- 14 x27 mm partially necrotic node  ?Centrilobular empheysema, severe. Necrotic appearing LUL mass 3.4 x 6.2 cm. Scattered airway impactions, bronchial wall thickening.  ?No lytic / blastic bone lesions ?Scattered hepatic hypodensities -- likely cyst. 48mm adenoma R adrenal gland. Prostatic hypertrophy  ?PVD -- stenosis or renal artery and lower extremity arterial inflow.  ? ?OSH MRI brain 4/5:  ?Multiple enhancing lesions -- largest being 4.3 x 3.6 x 4.3 cm R parieto-occipital lesion. Other lesions involving L frontal lobe, L parieto occipital border, Posterior R frontal lobe, Posterior R temporal love, Anterior R temporal lobe, L cerebellum, inferior vermain. There is  some mass effect on occipital horn R ventricle without midline shift. Some lesions with associated hemorrhage.  ? ?Pertinent  Medical History  ?HTN ?HLD ?Bladder cancer ?L Subclavian steal syndrome  ?Diverticulosis  ? ?Significant Hospital Events: ?Including procedures, antibiotic start and stop dates in addition to other pertinent events   ?4/5 to [place] after fall. CT head concerning for masses, again seen on MRI brain. CT c/a/p with large LUL necrotic appearing lung mass ?4/6 transfer to [facility]. Onc consult. PCCM consult  ? ?Interim History / Subjective:  ?Arrives to [place] long from [place]  ? ?Objective   ?Blood pressure (!) 152/70, pulse 64, temperature 97.9 ?F (36.6 ?C), temperature source Oral, resp. rate 18, SpO2 98 %. ?   ?   ?No intake or output data in the 24 hours ending 07/26/2021 1345 ?There were no vitals filed for this visit. ? ?Examination: ?General: Chronically ill thin elderly M reclined in bed NAD  ?HENT: L forehead bruising and local edema  ?Lungs: Coarse. Even unlabored on RA. Nonproductive cough  ?Cardiovascular: rrr s1s2  ?Abdomen: thin soft ndnt  ?Extremities: no acute deformity. No cyanosis or clubbing  ?Neuro: AAOx3 ?GU: defer  ? ?Resolved Hospital Problem list   ? ? ?Assessment & Plan:  ? ?LUL 6.3 cm mass ?-concerning for primary bronchogenic neoplasm ?L hilar adenopathy ?Aortopulmonary adenopathy  ?Severe centrilobular emphysema  ?Tobacco use disorder  ?P ?-onc consult pending  ?-will arrange for bronch. Sounds like preference is to complete inpatient if can be done in the next few days.  ?-declines nicotine patch at present, will let us know if he feels it is needed ?-PRN albuterol  ? ?  Numerous brain meds, with some associated local edema and possible hemorrhage, but without midline shift  ?P ?-decadron  ?-onc consult pending  ? ?L subclavian steal syndrome  ?HTN ?P ?-cont ASA ?-antihypertensives per primary  ? ?Severe protein calorie malnutrition ?-very poor PO intake at home. Very  thin, decr muscle mass and fat stores. ?P ?-will place for RDN consult.  ? ?Goals of Care ?-discussed code status with pt / wife. 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  ?-will enter DNR order as such. Prior to discharge, would probably be of good utility to complete a gold form  ? ?Best Practice (right click and "Reselect all SmartList Selections" daily)  ? ?Per primary  ? ?Labs   ?CBC: ?No results for input(s): WBC, NEUTROABS, HGB, HCT, MCV, PLT in the last 168 hours. ? ?Basic Metabolic Panel: ?No results for input(s): NA, K, CL, CO2, GLUCOSE, BUN, CREATININE, CALCIUM, MG, PHOS in the last 168 hours. ?GFR: ?CrCl cannot be calculated (Patient's most recent lab result is older than the maximum 21 days allowed.). ?No results for input(s): PROCALCITON, WBC, LATICACIDVEN in the last 168 hours. ? ?Liver Function Tests: ?No results for input(s): AST, ALT, ALKPHOS, BILITOT, PROT, ALBUMIN in the last 168 hours. ?No results for input(s): LIPASE, AMYLASE in the last 168 hours. ?No results for input(s): AMMONIA in the last 168 hours. ? ?ABG ?   ?Component Value Date/Time  ? PHART 7.301 (L) 01/29/2013 2025  ? PCO2ART 45.4 (H) 01/29/2013 2025  ? PO2ART 139.0 (H) 01/29/2013 2025  ? HCO3 21.7 01/29/2013 2025  ? TCO2 19.7 01/29/2013 2025  ? ACIDBASEDEF 4.2 (H) 01/29/2013 2025  ? O2SAT 98.5 01/29/2013 2025  ?  ? ?Coagulation Profile: ?No results for input(s): INR, PROTIME in the last 168 hours. ? ?Cardiac Enzymes: ?No results for input(s): CKTOTAL, CKMB, CKMBINDEX, TROPONINI in the last 168 hours. ? ?HbA1C: ?Hgb A1c MFr Bld  ?Date/Time Value Ref Range Status  ?01/31/2013 03:40 AM 6.2 (H) <5.7 % Final  ?  Comment:  ?  (NOTE) ?                                                                      ?According to the ADA Clinical Practice Recommendations for 2011, when ?HbA1c is used as a screening test: ? >=6.5%   Diagnostic of Diabetes Mellitus ?          (if  abnormal result is confirmed) ?5.7-6.4%   Increased risk of developing Diabetes Mellitus ?References:Diagnosis and Classification of Diabetes Mellitus,Diabetes ?NKNL,9767,34(LPFXT 1):S62-S69 and Standards of Medical Care in         ?Diabetes - 2011,Diabetes Care,2011,34 (Suppl 1):S11-S61.  ? ? ?CBG: ?No results for input(s): GLUCAP in the last 168 hours. ? ?Review of Systems:   ?Review of Systems  ?Constitutional:  Positive for weight loss.  ?HENT: Negative.    ?Eyes: Negative.   ?Respiratory:  Positive for cough. Negative for sputum production, shortness of breath and wheezing.   ?Cardiovascular: Negative.   ?Gastrointestinal: Negative.   ?Genitourinary: Negative.   ?Musculoskeletal: Negative.   ?Skin: Negative.   ?Neurological:  Positive for focal weakness and weakness. Negative for sensory change, speech change  and headaches.  ?Endo/Heme/Allergies:  Bruises/bleeds easily.  ?Psychiatric/Behavioral: Negative.    ? ? ?Past Medical History:  ?He,  has a past medical history of Abscess of abdominal wall (11/28/2012), Bladder cancer ([place]), CAD (coronary artery disease), Carotid artery stenosis, Colonic diverticular abscess s/p perc drainage x2 (11/10/2012), Colonoscopy refused, Diabetes mellitus, Diverticulitis ([id number]), Hyperlipidemia, Hypertension, Normal nuclear stress test (2010), PVD (peripheral vascular disease) ([place]) (08/12/2017), Skin cancer, and Subclavian steal syndrome.  ? ?Surgical History:  ? ?Past Surgical History:  ?Procedure Laterality Date  ? APPENDECTOMY    ? CARDIAC CATHETERIZATION  2002  ? He had a which showed mild coronary atherosclerosis and normal left ventricular function.  ? CATARACT EXTRACTION, BILATERAL Bilateral 01-23-13  ? 11'13  ? CYSTOSCOPY W/ URETERAL STENT PLACEMENT Bilateral 01/29/2013  ? Procedure: CYSTOSCOPY WITH BILATERAL RETROGRADE PYELOGRAM/BILATERAL URETERAL STENT PLACEMENT;  Surgeon: Fredricka Bonine, MD;  Location: [facility];  Service: Urology;  Laterality: Bilateral;  ?  CYSTOSCOPY WITH INJECTION    ? cystoscopy with BCG injections  ? FISTULOTOMY N/A 01/29/2013  ? Procedure: repair colovesical fustula ;  Surgeon: Adin Hector, MD;  Location: [facility];  Service: General;  Pecola Leisure

## 2021-07-21 ENCOUNTER — Observation Stay (HOSPITAL_COMMUNITY): Payer: Medicare Other

## 2021-07-21 ENCOUNTER — Other Ambulatory Visit: Payer: Self-pay | Admitting: Oncology

## 2021-07-21 ENCOUNTER — Observation Stay (HOSPITAL_BASED_OUTPATIENT_CLINIC_OR_DEPARTMENT_OTHER): Payer: Medicare Other | Admitting: Registered Nurse

## 2021-07-21 ENCOUNTER — Encounter (HOSPITAL_COMMUNITY): Admission: AD | Disposition: E | Payer: Self-pay | Source: Other Acute Inpatient Hospital | Attending: Internal Medicine

## 2021-07-21 ENCOUNTER — Encounter (HOSPITAL_COMMUNITY): Payer: Self-pay

## 2021-07-21 ENCOUNTER — Observation Stay (HOSPITAL_COMMUNITY): Payer: Medicare Other | Admitting: Registered Nurse

## 2021-07-21 ENCOUNTER — Other Ambulatory Visit: Payer: Self-pay | Admitting: Radiation Therapy

## 2021-07-21 DIAGNOSIS — J432 Centrilobular emphysema: Secondary | ICD-10-CM | POA: Diagnosis present

## 2021-07-21 DIAGNOSIS — Z681 Body mass index (BMI) 19 or less, adult: Secondary | ICD-10-CM | POA: Diagnosis not present

## 2021-07-21 DIAGNOSIS — D638 Anemia in other chronic diseases classified elsewhere: Secondary | ICD-10-CM | POA: Diagnosis present

## 2021-07-21 DIAGNOSIS — C3490 Malignant neoplasm of unspecified part of unspecified bronchus or lung: Secondary | ICD-10-CM | POA: Diagnosis not present

## 2021-07-21 DIAGNOSIS — R918 Other nonspecific abnormal finding of lung field: Secondary | ICD-10-CM

## 2021-07-21 DIAGNOSIS — Y92009 Unspecified place in unspecified non-institutional (private) residence as the place of occurrence of the external cause: Secondary | ICD-10-CM | POA: Diagnosis not present

## 2021-07-21 DIAGNOSIS — Z66 Do not resuscitate: Secondary | ICD-10-CM | POA: Diagnosis present

## 2021-07-21 DIAGNOSIS — I2510 Atherosclerotic heart disease of native coronary artery without angina pectoris: Secondary | ICD-10-CM

## 2021-07-21 DIAGNOSIS — E785 Hyperlipidemia, unspecified: Secondary | ICD-10-CM

## 2021-07-21 DIAGNOSIS — E871 Hypo-osmolality and hyponatremia: Secondary | ICD-10-CM

## 2021-07-21 DIAGNOSIS — R64 Cachexia: Secondary | ICD-10-CM | POA: Diagnosis present

## 2021-07-21 DIAGNOSIS — N179 Acute kidney failure, unspecified: Secondary | ICD-10-CM | POA: Diagnosis present

## 2021-07-21 DIAGNOSIS — E43 Unspecified severe protein-calorie malnutrition: Secondary | ICD-10-CM | POA: Diagnosis present

## 2021-07-21 DIAGNOSIS — C3412 Malignant neoplasm of upper lobe, left bronchus or lung: Secondary | ICD-10-CM | POA: Diagnosis present

## 2021-07-21 DIAGNOSIS — Z515 Encounter for palliative care: Secondary | ICD-10-CM | POA: Diagnosis not present

## 2021-07-21 DIAGNOSIS — F17210 Nicotine dependence, cigarettes, uncomplicated: Secondary | ICD-10-CM | POA: Diagnosis present

## 2021-07-21 DIAGNOSIS — D649 Anemia, unspecified: Secondary | ICD-10-CM | POA: Diagnosis not present

## 2021-07-21 DIAGNOSIS — Z20822 Contact with and (suspected) exposure to covid-19: Secondary | ICD-10-CM | POA: Diagnosis present

## 2021-07-21 DIAGNOSIS — E119 Type 2 diabetes mellitus without complications: Secondary | ICD-10-CM | POA: Diagnosis not present

## 2021-07-21 DIAGNOSIS — I700 Atherosclerosis of aorta: Secondary | ICD-10-CM | POA: Diagnosis not present

## 2021-07-21 DIAGNOSIS — Z01818 Encounter for other preprocedural examination: Secondary | ICD-10-CM | POA: Diagnosis not present

## 2021-07-21 DIAGNOSIS — Z8551 Personal history of malignant neoplasm of bladder: Secondary | ICD-10-CM | POA: Diagnosis not present

## 2021-07-21 DIAGNOSIS — E1151 Type 2 diabetes mellitus with diabetic peripheral angiopathy without gangrene: Secondary | ICD-10-CM | POA: Diagnosis present

## 2021-07-21 DIAGNOSIS — C7931 Secondary malignant neoplasm of brain: Secondary | ICD-10-CM

## 2021-07-21 DIAGNOSIS — C3432 Malignant neoplasm of lower lobe, left bronchus or lung: Secondary | ICD-10-CM | POA: Diagnosis not present

## 2021-07-21 DIAGNOSIS — R079 Chest pain, unspecified: Secondary | ICD-10-CM | POA: Diagnosis not present

## 2021-07-21 DIAGNOSIS — W108XXA Fall (on) (from) other stairs and steps, initial encounter: Secondary | ICD-10-CM | POA: Diagnosis present

## 2021-07-21 DIAGNOSIS — Z608 Other problems related to social environment: Secondary | ICD-10-CM | POA: Diagnosis present

## 2021-07-21 DIAGNOSIS — I10 Essential (primary) hypertension: Secondary | ICD-10-CM | POA: Diagnosis not present

## 2021-07-21 DIAGNOSIS — G936 Cerebral edema: Secondary | ICD-10-CM | POA: Diagnosis present

## 2021-07-21 DIAGNOSIS — Z9181 History of falling: Secondary | ICD-10-CM | POA: Diagnosis not present

## 2021-07-21 DIAGNOSIS — R911 Solitary pulmonary nodule: Secondary | ICD-10-CM | POA: Diagnosis not present

## 2021-07-21 DIAGNOSIS — R410 Disorientation, unspecified: Secondary | ICD-10-CM | POA: Diagnosis not present

## 2021-07-21 DIAGNOSIS — S0083XA Contusion of other part of head, initial encounter: Secondary | ICD-10-CM | POA: Diagnosis present

## 2021-07-21 DIAGNOSIS — Z885 Allergy status to narcotic agent status: Secondary | ICD-10-CM | POA: Diagnosis not present

## 2021-07-21 DIAGNOSIS — G458 Other transient cerebral ischemic attacks and related syndromes: Secondary | ICD-10-CM | POA: Diagnosis present

## 2021-07-21 DIAGNOSIS — Z7189 Other specified counseling: Secondary | ICD-10-CM | POA: Diagnosis not present

## 2021-07-21 HISTORY — PX: BRONCHIAL BIOPSY: SHX5109

## 2021-07-21 HISTORY — PX: BRONCHIAL BRUSHINGS: SHX5108

## 2021-07-21 LAB — BASIC METABOLIC PANEL
Anion gap: 8 (ref 5–15)
BUN: 28 mg/dL — ABNORMAL HIGH (ref 8–23)
CO2: 24 mmol/L (ref 22–32)
Calcium: 8.7 mg/dL — ABNORMAL LOW (ref 8.9–10.3)
Chloride: 99 mmol/L (ref 98–111)
Creatinine, Ser: 1.11 mg/dL (ref 0.61–1.24)
GFR, Estimated: >60 mL/min (ref >60)
Glucose, Bld: 139 mg/dL — ABNORMAL HIGH (ref 70–99)
Potassium: 4.4 mmol/L (ref 3.5–5.1)
Sodium: 131 mmol/L — ABNORMAL LOW (ref 135–145)

## 2021-07-21 LAB — CBC
HCT: 36.5 % — ABNORMAL LOW (ref 39.0–52.0)
Hemoglobin: 12.5 g/dL — ABNORMAL LOW (ref 13.0–17.0)
MCH: 32.4 pg (ref 26.0–34.0)
MCHC: 34.2 g/dL (ref 30.0–36.0)
MCV: 94.6 fL (ref 80.0–100.0)
Platelets: 280 10*3/uL (ref 150–400)
RBC: 3.86 MIL/uL — ABNORMAL LOW (ref 4.22–5.81)
RDW: 13.9 % (ref 11.5–15.5)
WBC: 10.8 10*3/uL — ABNORMAL HIGH (ref 4.0–10.5)
nRBC: 0 % (ref 0.0–0.2)

## 2021-07-21 LAB — GLUCOSE, CAPILLARY
Glucose-Capillary: 157 mg/dL — ABNORMAL HIGH (ref 70–99)
Glucose-Capillary: 145 mg/dL — ABNORMAL HIGH (ref 70–99)
Glucose-Capillary: 144 mg/dL — ABNORMAL HIGH (ref 70–99)
Glucose-Capillary: 126 mg/dL — ABNORMAL HIGH (ref 70–99)
Glucose-Capillary: 149 mg/dL — ABNORMAL HIGH (ref 70–99)
Glucose-Capillary: 126 mg/dL — ABNORMAL HIGH (ref 70–99)

## 2021-07-21 LAB — HEMOGLOBIN A1C
Hgb A1c MFr Bld: 6.2 % — ABNORMAL HIGH (ref 4.8–5.6)
Mean Plasma Glucose: 131.24 mg/dL

## 2021-07-21 LAB — SARS CORONAVIRUS 2 BY RT PCR (HOSPITAL ORDER, PERFORMED IN ~~LOC~~ HOSPITAL LAB): SARS Coronavirus 2: NEGATIVE

## 2021-07-21 SURGERY — BRONCHOSCOPY, WITH BIOPSY USING ELECTROMAGNETIC NAVIGATION
Anesthesia: General

## 2021-07-21 MED ORDER — FENTANYL CITRATE (PF) 250 MCG/5ML IJ SOLN
INTRAMUSCULAR | Status: DC | PRN
  Administered 2021-07-21: 50 ug via INTRAVENOUS

## 2021-07-21 MED ORDER — ONDANSETRON HCL 4 MG/2ML IJ SOLN
INTRAMUSCULAR | Status: DC | PRN
  Administered 2021-07-21: 4 mg via INTRAVENOUS

## 2021-07-21 MED ORDER — ADULT MULTIVITAMIN W/MINERALS CH
1.0000 | ORAL_TABLET | Freq: Every day | ORAL | Status: DC
  Administered 2021-07-22 – 2021-07-28 (×6): 1 via ORAL
  Filled 2021-07-21 (×6): qty 1

## 2021-07-21 MED ORDER — LIDOCAINE 2% (20 MG/ML) 5 ML SYRINGE
INTRAMUSCULAR | Status: DC | PRN
Start: 2021-07-21 — End: 2021-07-21
  Administered 2021-07-21: 100 mg via INTRAVENOUS

## 2021-07-21 MED ORDER — INSULIN ASPART 100 UNIT/ML IJ SOLN
0.0000 [IU] | INTRAMUSCULAR | Status: DC | PRN
  Filled 2021-07-21: qty 0.07

## 2021-07-21 MED ORDER — PHENYLEPHRINE 40 MCG/ML (10ML) SYRINGE FOR IV PUSH (FOR BLOOD PRESSURE SUPPORT)
PREFILLED_SYRINGE | INTRAVENOUS | Status: DC | PRN
  Administered 2021-07-21: 120 ug via INTRAVENOUS
  Administered 2021-07-21 (×2): 80 ug via INTRAVENOUS

## 2021-07-21 MED ORDER — PROPOFOL 500 MG/50ML IV EMUL
INTRAVENOUS | Status: DC | PRN
  Administered 2021-07-21: 150 ug/kg/min via INTRAVENOUS

## 2021-07-21 MED ORDER — LACTATED RINGERS IV SOLN: INTRAVENOUS | Status: DC

## 2021-07-21 MED ORDER — CHLORHEXIDINE GLUCONATE 0.12 % MT SOLN
OROMUCOSAL | Status: AC
  Administered 2021-07-21: 15 mL via OROMUCOSAL
  Filled 2021-07-21: qty 15

## 2021-07-21 MED ORDER — FLUTICASONE FUROATE-VILANTEROL 200-25 MCG/ACT IN AEPB
1.0000 | INHALATION_SPRAY | Freq: Every day | RESPIRATORY_TRACT | Status: DC
  Administered 2021-07-21 – 2021-07-27 (×7): 1 via RESPIRATORY_TRACT
  Filled 2021-07-21: qty 28

## 2021-07-21 MED ORDER — SUGAMMADEX SODIUM 200 MG/2ML IV SOLN
INTRAVENOUS | Status: DC | PRN
Start: 2021-07-21 — End: 2021-07-21
  Administered 2021-07-21: 50 mg via INTRAVENOUS
  Administered 2021-07-21 (×2): 100 mg via INTRAVENOUS

## 2021-07-21 MED ORDER — ROCURONIUM BROMIDE 10 MG/ML (PF) SYRINGE
PREFILLED_SYRINGE | INTRAVENOUS | Status: DC | PRN
  Administered 2021-07-21: 60 mg via INTRAVENOUS

## 2021-07-21 MED ORDER — CHLORHEXIDINE GLUCONATE 0.12 % MT SOLN
15.0000 mL | OROMUCOSAL | Status: AC
  Filled 2021-07-21: qty 15

## 2021-07-21 MED ORDER — ENSURE ENLIVE PO LIQD
237.0000 mL | Freq: Three times a day (TID) | ORAL | Status: DC
  Administered 2021-07-21 – 2021-07-27 (×15): 237 mL via ORAL

## 2021-07-21 MED ORDER — NICOTINE 7 MG/24HR TD PT24
7.0000 mg | MEDICATED_PATCH | Freq: Every day | TRANSDERMAL | Status: DC
  Administered 2021-07-21 – 2021-07-28 (×7): 7 mg via TRANSDERMAL
  Filled 2021-07-21 (×8): qty 1

## 2021-07-21 MED ORDER — PROPOFOL 10 MG/ML IV BOLUS
INTRAVENOUS | Status: DC | PRN
  Administered 2021-07-21: 80 mg via INTRAVENOUS

## 2021-07-21 SURGICAL SUPPLY — 47 items

## 2021-07-21 NOTE — Progress Notes (Signed)
Brief Oncology note: ? ?Ambulatory referral to Hematology/Oncology in [place] placed earlier today. Their office will arrange date/time of appointment and contact the patient with this information.  ?

## 2021-07-21 NOTE — Anesthesia Preprocedure Evaluation (Signed)
Anesthesia Evaluation  ?Patient identified by MRN, date of birth, ID band ?Patient awake ? ? ? ?Reviewed: ?Allergy & Precautions, NPO status , Patient's Chart, lab work & pertinent test results ? ?History of Anesthesia Complications ?Negative for: history of anesthetic complications ? ?Airway ?Mallampati: I ? ?TM Distance: >3 FB ?Neck ROM: Full ? ? ? Dental ? ?(+) Edentulous Upper, Edentulous Lower, Dental Advisory Given ?  ?Pulmonary ?Current Smoker,  ?Lung mass ?  ?Pulmonary exam normal ? ? ? ? ? ? ? Cardiovascular ?hypertension, Pt. on home beta blockers ?Normal cardiovascular exam ? ? ?  ?Neuro/Psych ?negative neurological ROS ? negative psych ROS  ? GI/Hepatic ?negative GI ROS, Neg liver ROS,   ?Endo/Other  ?diabetes ? Renal/GU ?negative Renal ROS  ? ?  ?Musculoskeletal ? ? Abdominal ?  ?Peds ? Hematology ?  ?Anesthesia Other Findings ? ? Reproductive/Obstetrics ? ?  ? ? ? ? ? ? ? ? ? ? ? ? ? ?  ?  ? ? ? ? ? ? ? ? ?Anesthesia Physical ?Anesthesia Plan ? ?ASA: 3 ? ?Anesthesia Plan: General  ? ?Post-op Pain Management: Minimal or no pain anticipated  ? ?Induction: Intravenous ? ?PONV Risk Score and Plan: Propofol infusion and TIVA ? ?Airway Management Planned: Oral ETT ? ?Additional Equipment:  ? ?Intra-op Plan:  ? ?Post-operative Plan: Extubation in OR ? ?Informed Consent: I have reviewed the patients History and Physical, chart, labs and discussed the procedure including the risks, benefits and alternatives for the proposed anesthesia with the patient or authorized representative who has indicated his/her understanding and acceptance.  ? ? ? ?Dental advisory given ? ?Plan Discussed with: CRNA and Anesthesiologist ? ?Anesthesia Plan Comments:   ? ? ? ? ? ? ?Anesthesia Quick Evaluation ? ?

## 2021-07-21 NOTE — Evaluation (Addendum)
Occupational Therapy Evaluation Patient Details Name: [name] MRN: [id number] DOB: [birth year] Today's Date: 07/21/2021   History of Present Illness 81 yo male presenting to [facility] ED after fall at home. Work up revealing L upper lobe mass and brain metastasis. Transfer to [facility] on 4/6.  S/p bronchoscopy and biopsies on 4/7. PMH including abdominal wall abscess, bladder cancer, CAD, carotid artery stenosis, diverticulitis, diverticular abscess, type 2 diabetes, hyperlipidemia, HTN, PVD, skin cancer, subclavian steal syndrome.   Clinical Impression   PTA, pt was living alone and was performing ADLs, light meal prep, and medication/financial management; wife drives him and her to dinner each night. Pt currently requiring Mod A for UB ADLs, Max A for LB ADLs, and Min A +2 for mobility with RW. Pt requiring max cues throughout for attention, problem solving, awareness, and safety. Pt at high risk for falls. Pt would benefit from further acute OT to facilitate safe dc. Recommend dc to SNF for further OT to optimize safety, independence with ADLs, and return to PLOF.      Recommendations for follow up therapy are one component of a multi-disciplinary discharge planning process, led by the attending physician.  Recommendations may be updated based on patient status, additional functional criteria and insurance authorization.   Follow Up Recommendations  Skilled nursing-short term rehab (<3 hours/day)    Assistance Recommended at Discharge Frequent or constant Supervision/Assistance  Patient can return home with the following A lot of help with walking and/or transfers;A lot of help with bathing/dressing/bathroom;Assistance with cooking/housework;Direct supervision/assist for medications management;Direct supervision/assist for financial management;Assist for transportation    Functional Status Assessment  Patient has had a recent decline in their functional status and  demonstrates the ability to make significant improvements in function in a reasonable and predictable amount of time.  Equipment Recommendations  Other (comment) (Defer to next venue)    Recommendations for Other Services PT consult     Precautions / Restrictions Precautions Precautions: Fall Precaution Comments: Has had multiple falls Restrictions Weight Bearing Restrictions: No      Mobility Bed Mobility Overal bed mobility: Needs Assistance Bed Mobility: Supine to Sit, Sit to Supine     Supine to sit: Min guard Sit to supine: Min guard   General bed mobility comments: Min Guard A for safety. Pt moving impulsively/quickly demonstrating good strength but poor awareness.    Transfers Overall transfer level: Needs assistance Equipment used: Rolling walker (2 wheels) Transfers: Sit to/from Stand Sit to Stand: Min assist           General transfer comment: MinA for decreased balance      Balance Overall balance assessment: Needs assistance Sitting-balance support: No upper extremity supported, Feet supported Sitting balance-Leahy Scale: Fair     Standing balance support: Bilateral upper extremity supported, During functional activity Standing balance-Leahy Scale: Poor Standing balance comment: Min A and/or UE support for maintaining standing                           ADL either performed or assessed with clinical judgement   ADL Overall ADL's : Needs assistance/impaired Eating/Feeding: Set up;Supervision/ safety;Sitting   Grooming: Minimal assistance;Sitting   Upper Body Bathing: Moderate assistance;Sitting   Lower Body Bathing: Moderate assistance;Sit to/from stand   Upper Body Dressing : Moderate assistance;Sitting   Lower Body Dressing: Moderate assistance;Sit to/from stand   Toilet Transfer: Minimal assistance;Cueing for sequencing;Cueing for safety;Ambulation;Rolling walker (2 wheels) (simulated to recliner)  Functional  mobility during ADLs: Minimal assistance;+2 for safety/equipment;Rolling walker (2 wheels) (Max cues for avoiding obstacles and managing RW) General ADL Comments: Pt presenting with decreased cognition, balance, and safety. Max cues throughout     Vision         Perception     Praxis      Pertinent Vitals/Pain Pain Assessment Pain Assessment: Faces Faces Pain Scale: No hurt Pain Intervention(s): Monitored during session     Hand Dominance     Extremity/Trunk Assessment Upper Extremity Assessment Upper Extremity Assessment: Generalized weakness   Lower Extremity Assessment Lower Extremity Assessment: Defer to PT evaluation   Cervical / Trunk Assessment Cervical / Trunk Assessment: Normal   Communication Communication Communication: No difficulties   Cognition Arousal/Alertness: Awake/alert Behavior During Therapy: Impulsive Overall Cognitive Status: Impaired/Different from baseline Area of Impairment: Attention, Memory, Following commands, Safety/judgement, Problem solving, Awareness                   Current Attention Level: Sustained, Selective, Focused (Flucuates quickly between levels; generally short durations of sustained) Memory: Decreased short-term memory Following Commands: Follows one step commands with increased time, Follows multi-step commands inconsistently Safety/Judgement: Decreased awareness of safety, Decreased awareness of deficits Awareness: Intellectual Problem Solving: Slow processing, Requires verbal cues General Comments: Pt with decreased awareness of safety, deficits, and environment. Pt moving impulsively requiring cues for slowing down and attending to environment. Pt with moments of walking straight into the wall or straight into a NT (despite cues for turning and changing direction). At end of session, pt reporting task was not difficulty and he had no problems     General Comments  Supine--HR 66; 95% on RA, 18 RR, 121/71     Exercises     Shoulder Instructions      Home Living Family/patient expects to be discharged to:: Private residence Living Arrangements: Alone (wife lives a mile away) Available Help at Discharge: Family Type of Home: House Home Access: Stairs to enter Secretary/administrator of Steps: 1-2 Entrance Stairs-Rails: None Home Layout: One level     Bathroom Shower/Tub: Chief Strategy Officer: Standard     Home Equipment: Wheelchair - Forensic psychologist (2 wheels);Cane - single point          Prior Functioning/Environment Prior Level of Function : Independent/Modified Independent;Driving             Mobility Comments: Reports he does not use AD ADLs Comments: Family assists with cooking/cleaning; patient does medication management. Wife reports recently noticing he "wrote three checks for his light bill"        OT Problem List: Decreased strength;Decreased range of motion;Decreased activity tolerance;Impaired balance (sitting and/or standing);Decreased knowledge of use of DME or AE;Decreased knowledge of precautions      OT Treatment/Interventions: Self-care/ADL training;Therapeutic exercise;Energy conservation;DME and/or AE instruction;Cognitive remediation/compensation;Therapeutic activities;Patient/family education;Balance training    OT Goals(Current goals can be found in the care plan section) Acute Rehab OT Goals Patient Stated Goal: Family, "figure out a plan because he isnt safe at home" OT Goal Formulation: With family Time For Goal Achievement: 08/04/21 Potential to Achieve Goals: Good ADL Goals Pt Will Perform Grooming: with min guard assist;standing Pt Will Perform Lower Body Dressing: with min guard assist;sit to/from stand Pt Will Transfer to Toilet: with min guard assist;regular height toilet;ambulating Additional ADL Goal #1: Pt will demonstrate increased safety awareness to perform ADLs with Min cues for safety/impulsivity Additional ADL  Goal #2: Pt will demonstrate emergent awareness during ADLs  with Min cues  OT Frequency: Min 2X/week    Co-evaluation PT/OT/SLP Co-Evaluation/Treatment: Yes Reason for Co-Treatment: For patient/therapist safety;To address functional/ADL transfers;Other (comment) (+2 line management and safety)   OT goals addressed during session: ADL's and self-care      AM-PAC OT "6 Clicks" Daily Activity     Outcome Measure Help from another person eating meals?: A Little Help from another person taking care of personal grooming?: A Little Help from another person toileting, which includes using toliet, bedpan, or urinal?: A Lot Help from another person bathing (including washing, rinsing, drying)?: A Lot Help from another person to put on and taking off regular upper body clothing?: A Little Help from another person to put on and taking off regular lower body clothing?: A Little 6 Click Score: 16   End of Session Equipment Utilized During Treatment: Gait belt;Rolling walker (2 wheels) Nurse Communication: Mobility status  Activity Tolerance: Patient tolerated treatment well Patient left: in bed;with call bell/phone within reach;with bed alarm set;with family/visitor present  OT Visit Diagnosis: Unsteadiness on feet (R26.81);Other abnormalities of gait and mobility (R26.89);Muscle weakness (generalized) (M62.81)                Time: 1308-6578 OT Time Calculation (min): 24 min Charges:  OT General Charges $OT Visit: 1 Visit OT Evaluation $OT Eval Moderate Complexity: 1 Mod  Ozelle Brubacher MSOT, OTR/L Acute Rehab Pager: [phone] Office: [phone]  Theodoro Grist Ashana Tullo July 30, 2021, 1:48 PM

## 2021-07-21 NOTE — Transfer of Care (Signed)
Immediate Anesthesia Transfer of Care Note ? ?Patient: [name] ? ?Procedure(s) Performed: ROBOTIC ASSISTED NAVIGATIONAL BRONCHOSCOPY ?BRONCHIAL BRUSHINGS ?BRONCHIAL BIOPSIES ? ?Patient Location: PACU ? ?Anesthesia Type:General ? ?Level of Consciousness: awake and drowsy ? ?Airway & Oxygen Therapy: Patient Spontanous Breathing and Patient connected to face mask oxygen ? ?Post-op Assessment: Report given to RN and Post -op Vital signs reviewed and stable ? ?Post vital signs: Reviewed and stable ? ?Last Vitals:  ?Vitals Value Taken Time  ?BP 139/102 07/19/2021 1117  ?Temp    ?Pulse 76 07/29/2021 1121  ?Resp 31 08/10/2021 1121  ?SpO2 96 % 08/03/2021 1121  ?Vitals shown include unvalidated device data. ? ?Last Pain:  ?Vitals:  ? 08/07/2021 0815  ?TempSrc:   ?PainSc: 0-No pain  ?   ? ?Patients Stated Pain Goal: 2 (08/11/2021 0815) ? ?Complications: No notable events documented. ?

## 2021-07-21 NOTE — Progress Notes (Signed)
Patient's family arrived to room while patient in surgery. They are concerned about his safety at home, as he lives by himself. He has had multiple falls recently at home.  ? ?Most recently they have been very worried about his physical health and trying to convince patient to come to hospital several times which he had refused until having 2 falls in 15 minutes, at which point he agreed.  ? ?PT and OT evals have already been ordered. Social work has been made aware and are following. ?

## 2021-07-21 NOTE — Progress Notes (Signed)
Pt transported to CT scan. Pt in no apparent distress or pain. ?

## 2021-07-21 NOTE — Care Management Obs Status (Signed)
MEDICARE OBSERVATION STATUS NOTIFICATION ? ? ?Patient Details  ?Name: [name] ?MRN: [id number] ?Date of Birth: 1940/05/27 ? ? ?Medicare Observation Status Notification Given:  Yes ? ? ? ?Angelita Ingles, RN ?08/01/2021, 12:42 PM ?

## 2021-07-21 NOTE — Hospital Course (Addendum)
[name] is a 81 y.o. male with a history of abdominal wall abscess, bladder cancer, CAD, carotid artery stenosis, diverticulitis, diabetes, hyperlipidemia, hypertension, PVD, subclavian steal syndrome. Patient presented from an outside hospital secondary to evidence of pulmonary and brain masses concerning for metastatic cancer. Patient transferred to [facility] for bronchoscopy and biopsy. Decadron started for associated brain edema.  S/p bronch on 4/7.  Pathology shows squamous cell lung primary.  MRI brain ordered to determine type of radiation.  Also await SNF placement vs home- defer to TOC.  Stay complicated by hyponatremia ?

## 2021-07-21 NOTE — Op Note (Signed)
Video Bronchoscopy with Robotic Assisted Bronchoscopic Navigation  ? ?Date of Operation: 07/27/2021  ? ?Pre-op Diagnosis: Lung Mass ? ?Post-op Diagnosis: Lung Mass  ? ?Surgeon: Garner Nash, DO  ? ?Assistants: None  ? ?Anesthesia: General endotracheal anesthesia ? ?Operation: Flexible video fiberoptic bronchoscopy with robotic assistance and biopsies. ? ?Estimated Blood Loss: Minimal ? ?Complications: None ? ?Indications and History: ?[name] is a 81 y.o. male with history of lung mass. The risks, benefits, complications, treatment options and expected outcomes were discussed with the patient.  The possibilities of pneumothorax, pneumonia, reaction to medication, pulmonary aspiration, perforation of a viscus, bleeding, failure to diagnose a condition and creating a complication requiring transfusion or operation were discussed with the patient who freely signed the consent.   ? ?Description of Procedure: ?The patient was seen in the Preoperative Area, was examined and was deemed appropriate to proceed.  The patient was taken to [facility] endoscopy room 3, identified as [name] and the procedure verified as Flexible Video Fiberoptic Bronchoscopy.  A Time Out was held and the above information confirmed.  ? ?Prior to the date of the procedure a high-resolution CT scan of the chest was performed. Utilizing ION software program a virtual tracheobronchial tree was generated to allow the creation of distinct navigation pathways to the patient's parenchymal abnormalities. After being taken to the operating room general anesthesia was initiated and the patient  was orally intubated. The video fiberoptic bronchoscope was introduced via the endotracheal tube and a general inspection was performed which showed normal right and left lung anatomy, aspiration of the bilateral mainstems was completed to remove any remaining secretions. Robotic catheter inserted into patient's endotracheal tube.  ? ?Target #1 LUL: ?The  distinct navigation pathways prepared prior to this procedure were then utilized to navigate to patient's lesion identified on CT scan. The robotic catheter was secured into place and the vision probe was withdrawn.  Lesion location was approximated using fluoroscopy and radial endobronchial ultrasound for peripheral targeting. Under fluoroscopic guidance transbronchial needle brushings, transbronchial needle biopsies, and transbronchial forceps biopsies were performed to be sent for cytology and pathology. ? ?At the end of the procedure a general airway inspection was performed and there was no evidence of active bleeding. The bronchoscope was removed.  The patient tolerated the procedure well. There was no significant blood loss and there were no obvious complications. A post-procedural chest x-ray is pending. ? ?Samples Target #1: ?1. Transbronchial needle brushings from LUL ?2. Transbronchial forceps biopsies from LUL ? ?Plans:  ?The patient will be discharged from the PACU to home when recovered from anesthesia and after chest x-ray is reviewed. We will review the cytology, pathology results with the patient when they become available. Outpatient followup will be with Octavio Graves Mercedees Convery, DO.  ? ?Garner Nash, DO ?[facility] ?07/25/2021 11:07 AM   ? ?

## 2021-07-21 NOTE — Anesthesia Procedure Notes (Addendum)
Date/Time: 07/25/2021 10:26 AM ?Performed by: Trinna Post., CRNA ?Pre-anesthesia Checklist: Patient identified, Emergency Drugs available, Suction available, Patient being monitored and Timeout performed ?Patient Re-evaluated:Patient Re-evaluated prior to induction ?Oxygen Delivery Method: Circle system utilized ?Preoxygenation: Pre-oxygenation with 100% oxygen ?Induction Type: IV induction ?Ventilation: Mask ventilation without difficulty and Oral airway inserted - appropriate to patient size ?Laryngoscope Size: Mac and 4 ?Grade View: Grade I ?Tube type: Oral ?Tube size: 8.5 mm ?Airway Equipment and Method: Stylet ?Placement Confirmation: ETT inserted through vocal cords under direct vision, positive ETCO2 and breath sounds checked- equal and bilateral ?Secured at: 23 cm ?Tube secured with: Tape ?Dental Injury: Teeth and Oropharynx as per pre-operative assessment  ? ? ? ? ?

## 2021-07-21 NOTE — Progress Notes (Signed)
? ?PROGRESS NOTE ? ? ? [name]  YNW:[id number] DOB: March 07, 1941 DOA: 07/29/2021 ?PCP: Janine Limbo, PA-C ? ? ?Brief Narrative: ?[name] is a 81 y.o. male with a history of abdominal wall abscess, bladder cancer, CAD, carotid artery stenosis, diverticulitis, diabetes, hyperlipidemia, hypertension, PVD, subclavian steal syndrome. Patient presented from an outside hospital secondary to evidence of pulmonary and brain masses concerning for metastatic cancer. Patient transferred to [facility] for bronchoscopy and biopsy. Decadron started for associated brain edema. ? ? ?Assessment and Plan: ? ?Left upper lobe mass ?Brain metastasis ?Newly diagnosed. Concern for primary lung cancer. Neurosurgery consulted at outside hospital ED with recommendations for no surgical treatment. Pulmonology consulted and performed bronchoscopy on 4/7 for biopsy. Medical and Radiation oncology consulted. Patient and family opting for continued care in [place], rather than initiating care in [place]. Started on decadron for brain metastasis with associated cerebral edema and mass effect. ?-Continue Decadron ?-Follow-up medical and radiation oncology recommendations ? ?CAD ?Asymptomatic ?-Hold aspirin but may need to restart in setting of PVD, subclavian steal syndrome and carotid artery stenosis ?-Continue Simvastatin and metoprolol ? ?Hypertension ?-Continue metoprolol 50 mg BID ? ?Hyperlipidemia ?-Continue simvastatin and Lovaza ? ?Diabetes mellitus, type 2 ?Hemoglobin A1C of 6.2%. patient is on no medication therapy. Now on decadron for cerebral edema. ?-Carb modified diet ?-Continue SSI while on steroids ? ?Anemia ?Mild. Stable. ? ?Hyponatremia ?Mild. Seems to be asymptomatic. ? ?DVT prophylaxis: SCDs ?Code Status:   Code Status: DNR ?Family Communication: Daughter on telephone (10 minutes). Spoke with wife who handed phone to daughter. ?Disposition Plan: Discharge likely to SNF pending PT evaluation and depending  on if patient started inpatient radiation treatments vs outpatient radiation treatments. ? ? ?Consultants:  ?Medical oncology ?Radiation oncology ?Pulmonology ? ?Procedures:  ?Bronchoscopy (4/7) ? ?Antimicrobials: ?None  ? ? ?Subjective: ?Patient states he feels a little confused regarding time of day. No dyspnea or chest pain. ? ?Objective: ?BP (!) 158/70 (BP Location: Right Arm)   Pulse 70   Temp 98.3 ?F (36.8 ?C) (Oral)   Resp 20   Ht 6\' 2"  (1.88 m)   Wt 62.1 kg   SpO2 93%   BMI 17.58 kg/m?  ? ?Examination: ? ?General exam: Appears calm and comfortable ?Respiratory system: Intermittent wheezing, otherwise clear to auscultation. Respiratory effort normal. ?Cardiovascular system: S1 & S2 heard, RRR. No murmurs, rubs, gallops or clicks. ?Gastrointestinal system: Abdomen is nondistended, soft and nontender. No organomegaly or masses felt. Normal bowel sounds heard. ?Central nervous system: Alert and oriented to person, place. ?Musculoskeletal: No edema. No calf tenderness ?Skin: No cyanosis. No rashes ?Psychiatry: Judgement and insight appear normal. Mood & affect appropriate.  ? ? ?Data Reviewed: I have personally reviewed following labs and imaging studies ? ?CBC ?Lab Results  ?Component Value Date  ? WBC 10.1 07/18/2019  ? RBC 4.10 (L) 07/18/2019  ? HGB 13.5 07/18/2019  ? HCT 41.1 07/18/2019  ? MCV 100.2 (H) 07/18/2019  ? MCH 32.9 07/18/2019  ? PLT 269 07/18/2019  ? MCHC 32.8 07/18/2019  ? RDW 12.9 07/18/2019  ? LYMPHSABS 0.7 07/18/2019  ? MONOABS 0.9 07/18/2019  ? EOSABS 0.0 07/18/2019  ? BASOSABS 0.0 07/18/2019  ? ? ? ?Last metabolic panel ?Lab Results  ?Component Value Date  ? NA 134 (L) 07/18/2019  ? K 4.2 07/18/2019  ? CL 97 (L) 07/18/2019  ? CO2 29 07/18/2019  ? BUN 20 07/18/2019  ? CREATININE 1.11 07/18/2019  ? GLUCOSE 138 (H) 07/18/2019  ?  GFRNONAA >60 07/18/2019  ? GFRAA >60 07/18/2019  ? CALCIUM 8.9 07/18/2019  ? PROT 6.7 11/10/2012  ? ALBUMIN 3.2 (L) 11/10/2012  ? BILITOT 0.4 11/10/2012  ?  ALKPHOS 79 11/10/2012  ? AST 27 11/10/2012  ? ALT 37 11/10/2012  ? ANIONGAP 8 07/18/2019  ? ? ?GFR: ?CrCl cannot be calculated (Patient's most recent lab result is older than the maximum 21 days allowed.). ? ?Recent Results (from the past 240 hour(s))  ?MRSA Next Gen by PCR, Nasal     Status: None  ? Collection Time: 07/29/2021  8:33 PM  ? Specimen: Nasal Mucosa; Nasal Swab  ?Result Value Ref Range Status  ? MRSA by PCR Next Gen NOT DETECTED NOT DETECTED Final  ?  Comment: (NOTE) ?The GeneXpert MRSA Assay (FDA approved for NASAL specimens only), ?is one component of a comprehensive MRSA colonization surveillance ?program. It is not intended to diagnose MRSA infection nor to guide ?or monitor treatment for MRSA infections. ?Test performance is not FDA approved in patients less than 2 years ?old. ?Performed at [facility], [place] [address] [place], [place] ?[address] ?  ?  ? ? ?Radiology Studies: ?No results found. ? ? ? LOS: 1 day  ? ? ?Cordelia Poche, MD ?[name] ?08/10/2021, 7:46 AM ? ? ?If 7PM-7AM, please contact night-coverage ?[email or web address] ? ?

## 2021-07-21 NOTE — Evaluation (Signed)
Physical Therapy Evaluation ?Patient Details ?Name: [name] ?MRN: [id number] ?DOB: 09-04-1940 ?Today's Date: 08/06/2021 ? ?History of Present Illness ? 81 yo male presenting to [facility] ED after fall at home. Work up revealing L upper lobe mass and brain metastasis. Transfer to [facility] on 4/6.  S/p bronchoscopy and biopsies on 4/7. PMH including abdominal wall abscess, bladder cancer, CAD, carotid artery stenosis, diverticulitis, diverticular abscess, type 2 diabetes, hyperlipidemia, HTN, PVD, skin cancer, subclavian steal syndrome.  ?Clinical Impression ? Pt admitted secondary to problem above with deficits below. Pt with decreased safety awareness and running into walls and people throughout mobility tasks. Requiring min A +2 for mobility tasks this session. Pt requiring max cues throughout for attention, problem solving, awareness, and safety. Pt at high risk for falls. Recommending SNF level therapies at d/c to address current deficits. Will continue to follow acutely.  ?   ? ?Recommendations for follow up therapy are one component of a multi-disciplinary discharge planning process, led by the attending physician.  Recommendations may be updated based on patient status, additional functional criteria and insurance authorization. ? ?Follow Up Recommendations Skilled nursing-short term rehab (<3 hours/day) ? ?  ?Assistance Recommended at Discharge Frequent or constant Supervision/Assistance  ?Patient can return home with the following ? A lot of help with walking and/or transfers;A lot of help with bathing/dressing/bathroom;Assistance with cooking/housework;Direct supervision/assist for medications management;Direct supervision/assist for financial management;Assist for transportation;Help with stairs or ramp for entrance ? ?  ?Equipment Recommendations None recommended by PT  ?Recommendations for Other Services ?    ?  ?Functional Status Assessment Patient has had a recent decline in their  functional status and demonstrates the ability to make significant improvements in function in a reasonable and predictable amount of time.  ? ?  ?Precautions / Restrictions Precautions ?Precautions: Fall ?Precaution Comments: Has had multiple falls ?Restrictions ?Weight Bearing Restrictions: No  ? ?  ? ?Mobility ? Bed Mobility ?Overal bed mobility: Needs Assistance ?Bed Mobility: Supine to Sit, Sit to Supine ?  ?  ?Supine to sit: Min guard ?Sit to supine: Min guard ?  ?General bed mobility comments: Min Guard A for safety. Pt moving impulsively/quickly demonstrating good strength but poor awareness. ?  ? ?Transfers ?Overall transfer level: Needs assistance ?Equipment used: Rolling walker (2 wheels) ?Transfers: Sit to/from Stand ?Sit to Stand: Min assist ?  ?  ?  ?  ?  ?General transfer comment: MinA for decreased balance. Safety cues to wait for PT to stand ?  ? ?Ambulation/Gait ?Ambulation/Gait assistance: Min assist ?Gait Distance (Feet): 120 Feet ?Assistive device: Rolling walker (2 wheels) ?Gait Pattern/deviations: Step-through pattern, Decreased stride length ?Gait velocity: Decreased ?  ?  ?General Gait Details: Poor safety awareness with use of RW, especially with turning. Required continuous cues to stay inside of RW and for safe gait speed. Pt with tendency to run into walls and was unaware that he was about to run into NT in the hallway. Pt unable to correct and NT ended up having to move out of the way. ? ?Stairs ?  ?  ?  ?  ?  ? ?Wheelchair Mobility ?  ? ?Modified Rankin (Stroke Patients Only) ?  ? ?  ? ?Balance Overall balance assessment: Needs assistance ?Sitting-balance support: No upper extremity supported, Feet supported ?Sitting balance-Leahy Scale: Fair ?  ?  ?Standing balance support: Bilateral upper extremity supported, During functional activity ?Standing balance-Leahy Scale: Poor ?Standing balance comment: Min A and/or UE support for maintaining standing ?  ?  ?  ?  ?  ?  ?  ?  ?  ?  ?  ?    ? ? ? ?  Pertinent Vitals/Pain Pain Assessment ?Pain Assessment: Faces ?Faces Pain Scale: No hurt  ? ? ?Home Living Family/patient expects to be discharged to:: Private residence ?Living Arrangements: Alone (wife lives a mile away) ?Available Help at Discharge: Family ?Type of Home: House ?Home Access: Stairs to enter ?Entrance Stairs-Rails: None ?Entrance Stairs-Number of Steps: 1-2 ?  ?Home Layout: One level ?Home Equipment: Wheelchair - Publishing copy (2 wheels);Cane - single point ?   ?  ?Prior Function Prior Level of Function : Independent/Modified Independent;Driving ?  ?  ?  ?  ?  ?  ?Mobility Comments: Reports he does not use AD ?ADLs Comments: Family assists with cooking/cleaning; patient does medication management. Wife reports recently noticing he "wrote three checks for his light bill" ?  ? ? ?Hand Dominance  ?   ? ?  ?Extremity/Trunk Assessment  ? Upper Extremity Assessment ?Upper Extremity Assessment: Defer to OT evaluation ?  ? ?Lower Extremity Assessment ?Lower Extremity Assessment: Generalized weakness ?  ? ?Cervical / Trunk Assessment ?Cervical / Trunk Assessment: Normal  ?Communication  ? Communication: No difficulties  ?Cognition Arousal/Alertness: Awake/alert ?Behavior During Therapy: Impulsive ?Overall Cognitive Status: Impaired/Different from baseline ?Area of Impairment: Attention, Memory, Following commands, Safety/judgement, Problem solving, Awareness ?  ?  ?  ?  ?  ?  ?  ?  ?  ?Current Attention Level: Sustained, Selective, Focused (Flucuates quickly between levels; generally short durations of sustained) ?Memory: Decreased short-term memory ?Following Commands: Follows one step commands with increased time, Follows multi-step commands inconsistently ?Safety/Judgement: Decreased awareness of safety, Decreased awareness of deficits ?Awareness: Intellectual ?Problem Solving: Slow processing, Requires verbal cues ?General Comments: Pt with decreased awareness of safety, deficits, and  environment. Pt moving impulsively requiring cues for slowing down and attending to environment. Pt with moments of walking straight into the wall or straight into a NT (despite cues for turning and changing direction). At end of session, pt reporting task was not difficulty and he had no problems ?  ?  ? ?  ?General Comments General comments (skin integrity, edema, etc.): Supine--HR 66; 95% on RA, 18 RR, 121/71 ? ?  ?Exercises    ? ?Assessment/Plan  ?  ?PT Assessment Patient needs continued PT services  ?PT Problem List Decreased strength;Decreased balance;Decreased activity tolerance;Decreased mobility;Decreased cognition;Decreased knowledge of use of DME;Decreased safety awareness;Decreased knowledge of precautions ? ?   ?  ?PT Treatment Interventions DME instruction;Gait training;Functional mobility training;Therapeutic activities;Therapeutic exercise;Balance training;Stair training;Patient/family education   ? ?PT Goals (Current goals can be found in the Care Plan section)  ?Acute Rehab PT Goals ?Patient Stated Goal: to go home ?PT Goal Formulation: With patient ?Time For Goal Achievement: 08/04/21 ?Potential to Achieve Goals: Good ? ?  ?Frequency Min 2X/week ?  ? ? ?Co-evaluation PT/OT/SLP Co-Evaluation/Treatment: Yes ?Reason for Co-Treatment: For patient/therapist safety;To address functional/ADL transfers;Other (comment) (+2 line management) ?PT goals addressed during session: Mobility/safety with mobility;Balance;Proper use of DME ?OT goals addressed during session: ADL's and self-care ?  ? ? ?  ?AM-PAC PT "6 Clicks" Mobility  ?Outcome Measure Help needed turning from your back to your side while in a flat bed without using bedrails?: A Little ?Help needed moving from lying on your back to sitting on the side of a flat bed without using bedrails?: A Little ?Help needed moving to and from a bed to a chair (including a wheelchair)?: A Little ?Help needed standing up from a chair using your arms (e.g.,  wheelchair or bedside chair)?: A Little ?Help needed to walk  in hospital room?: A Lot ?Help needed climbing 3-5 steps with a railing? : A Lot ?6 Click Score: 16 ? ?  ?End of Session Equipment Utilized During Treatment:

## 2021-07-21 NOTE — Interval H&P Note (Signed)
History and Physical Interval Note: ? ?07/27/2021 ?10:10 AM ? ?DESTYN SCHUYLER  has presented today for surgery, with the diagnosis of lung mass.  The various methods of treatment have been discussed with the patient and family. After consideration of risks, benefits and other options for treatment, the patient has consented to  Procedure(s): ?ROBOTIC ASSISTED NAVIGATIONAL BRONCHOSCOPY as a surgical intervention.  The patient's history has been reviewed, patient examined, no change in status, stable for surgery.  I have reviewed the patient's chart and labs.  Questions were answered to the patient's satisfaction.   ? ? ?Octavio Graves Crystin Lechtenberg ? ? ?

## 2021-07-21 NOTE — Progress Notes (Signed)
Initial Nutrition Assessment ? ?DOCUMENTATION CODES:  ? ?Underweight, Severe malnutrition in context of chronic illness ? ?INTERVENTION:  ? ?- Given severe malnutrition, poor appetite, and poor PO intake, liberalize diet to Regular ? ?- Ensure Enlive po TID, each supplement provides 350 kcal and 20 grams of protein ? ?- MVI with minerals daily ? ?- Provided "High Calorie, High Protein Nutrition Therapy" handout from the Academy of Nutrition and Dietetics ? ?NUTRITION DIAGNOSIS:  ? ?Severe Malnutrition related to chronic illness (emphysema, lung mass, intracranial metastasis) as evidenced by severe fat depletion, severe muscle depletion. ? ?GOAL:  ? ?Patient will meet greater than or equal to 90% of their needs ? ?MONITOR:  ? ?PO intake, Supplement acceptance, Labs, Weight trends ? ?REASON FOR ASSESSMENT:  ? ?Consult ?Assessment of nutrition requirement/status ? ?ASSESSMENT:  ? ?81 year old male who presented for evaluation of neurological symptoms after a fall who was found to have intracranial metastasis and a lung mass. PMH of centrilobular emphysema, HLD, HTN, CAD, T2DM, PVD, diverticulitis s/p sigmoid colectomy, bladder cancer treated with BCG. ? ?04/07 - s/p bronchoscopy ? ?Spoke with pt and family at bedside. RN in room providing nursing care. Pt's family reports pt with a decline in appetite for greater than 1 year. They report that pt still tries to eat 3 meals daily but that portion sizes are much smaller than they used to be. For breakfast, pt typically eats a bowl of cereal. For lunch, pt always eats a sandwich, 3 chocolate chip cookies, yogurt, and a handful of cashews. For dinner, pt goes out to eat with his family and may have a small serving of pasta salad. Pt's family reports that pt really enjoys sweets and eats these throughout the day. ? ?Pt reports that several years ago he had diverticulitis and went to a dietitian who told him to cut back on his portion sizes. He states that he lost 40 lbs over  the course of several years. Pt reports a UBW of 185 lbs but that at one point he weighed up to 200 lbs. Pt's family member reports weighing him the other day and that he weighed 145 lbs. ? ?Weight history in chart is limited. Noted pt with a 3 kg weight loss over the last year. This is a 4.4% weight loss which is not clinically significant for timeframe but concerning given progressive weight loss over time. Pt meets criteria for severe malnutrition based on NFPE. ? ?Pt willing to try oral nutrition supplements during admission. RD provided pt with a vanilla Ensure to try while taking his medications. RD to order these TID between meals. Will also order daily MVI with minerals. RD provided pt's family with "High Calorie, High Protein Nutrition Therapy" handout from the Academy of Nutrition and Dietetics. Discussed ways to increase kcal and protein intake without increasing volume of food. ? ?Medications reviewed and include: decadron, SSI, lovaza ?IVF: LR @ 10 ml/hr ? ?Labs reviewed: sodium 131, BUN 28, WBC 10.8, hemoglobin A1C 6.2 ?CBG's: 141-157 x 24 hours ? ?NUTRITION - FOCUSED PHYSICAL EXAM: ? ?Flowsheet Row Most Recent Value  ?Orbital Region Severe depletion  ?Upper Arm Region Moderate depletion  ?Thoracic and Lumbar Region Severe depletion  ?Buccal Region Severe depletion  ?Temple Region Severe depletion  ?Clavicle Bone Region Severe depletion  ?Clavicle and Acromion Bone Region Severe depletion  ?Scapular Bone Region Severe depletion  ?Dorsal Hand Severe depletion  ?Patellar Region Severe depletion  ?Anterior Thigh Region Severe depletion  ?Posterior Calf Region Moderate depletion  ?Edema (  RD Assessment) None  ?Hair Reviewed  ?Eyes Reviewed  ?Mouth Reviewed  ?Skin Reviewed  ?Nails Reviewed  ? ?  ? ? ?Diet Order:   ?Diet Order   ? ?       ?  Diet regular Room service appropriate? Yes; Fluid consistency: Thin  Diet effective now       ?  ? ?  ?  ? ?  ? ? ?EDUCATION NEEDS:  ? ?Education needs have been  addressed ? ?Skin:  Skin Assessment: Skin Integrity Issues: (skin tear head) ? ?Last BM:  07/19/21 ? ?Height:  ? ?Ht Readings from Last 1 Encounters:  ?07/31/2021 6\' 2"  (1.88 m)  ? ? ?Weight:  ? ?Wt Readings from Last 1 Encounters:  ?07/18/2021 65.3 kg  ? ? ?BMI:  Body mass index is 18.49 kg/m?. ? ?Estimated Nutritional Needs:  ? ?Kcal:  2000-2200 ? ?Protein:  100-115 grams ? ?Fluid:  >/= 2.0 L ? ? ? ?Gustavus Bryant, MS, RD, LDN ?Inpatient Clinical Dietitian ?Please see AMiON for contact information. ? ?

## 2021-07-21 NOTE — Anesthesia Postprocedure Evaluation (Signed)
Anesthesia Post Note ? ?Patient: [name] ? ?Procedure(s) Performed: ROBOTIC ASSISTED NAVIGATIONAL BRONCHOSCOPY ?BRONCHIAL BRUSHINGS ?BRONCHIAL BIOPSIES ? ?  ? ?Patient location during evaluation: PACU ?Anesthesia Type: General ?Level of consciousness: sedated ?Pain management: pain level controlled ?Vital Signs Assessment: post-procedure vital signs reviewed and stable ?Respiratory status: spontaneous breathing and respiratory function stable ?Cardiovascular status: stable ?Postop Assessment: no apparent nausea or vomiting ?Anesthetic complications: no ? ? ?No notable events documented. ? ?Last Vitals:  ?Vitals:  ? 08/03/2021 1230 08/02/2021 1245  ?BP:    ?Pulse: 74 64  ?Resp: (!) 22 19  ?Temp:    ?SpO2: 95% 92%  ?  ?Last Pain:  ?Vitals:  ? 07/26/2021 1151  ?TempSrc: Oral  ?PainSc:   ? ? ?  ?  ?  ?  ?  ?  ? ?Tanyika Barros DANIEL ? ? ? ? ?

## 2021-07-21 NOTE — TOC Progression Note (Signed)
Transition of Care (TOC) - Progression Note  ? ? ?Patient Details  ?Name: [name] ?MRN: [id number] ?Date of Birth: 1940/10/01 ? ?Transition of Care (TOC) CM/SW Contact  ?Angelita Ingles, RN ?Phone Number:[phone] ? ?07/15/2021, 12:47 PM ? ?Clinical Narrative:    ? ?Transition of Care (TOC) Screening Note ? ? ?Patient Details  ?Name: [name] ?Date of Birth: 1941-02-27 ? ? ?Transition of Care (TOC) CM/SW Contact:    ?Angelita Ingles, RN ?Phone Number: ?08/09/2021, 12:48 PM ? ? ? ?Transition of Care Department [facility]) has reviewed patient and no TOC needs have been identified at this time. We will continue to monitor patient advancement through interdisciplinary progression rounds. If new patient transition needs arise, please place a TOC consult. ?  ? ? ?  ?  ? ?Expected Discharge Plan and Services ?  ?  ?  ?  ?  ?                ?  ?  ?  ?  ?  ?  ?  ?  ?  ?  ? ? ?Social Determinants of Health (SDOH) Interventions ?  ? ?Readmission Risk Interventions ?   ? View : No data to display.  ?  ?  ?  ? ? ?

## 2021-07-21 NOTE — Progress Notes (Signed)
PT Cancellation Note ? ?Patient Details ?Name: [name] ?MRN: [id number] ?DOB: October 16, 1940 ? ? ?Cancelled Treatment:    Reason Eval/Treat Not Completed: Patient at procedure or test/unavailable Pt in OR. Will follow up as schedule allows.  ? ?Reuel Derby, PT, DPT  ?Acute Rehabilitation Services  ?Pager: [phone] ?Office: [phone] ? ? ? ?[place] ?07/29/2021, 11:10 AM ?

## 2021-07-21 NOTE — TOC Progression Note (Addendum)
Transition of Care (TOC) - Progression Note  ? ? ?Patient Details  ?Name: [name] ?MRN: [id number] ?Date of Birth: 18-Mar-1941 ? ?Transition of Care (TOC) CM/SW Contact  ?Dayne Chait Renold Don, LCSWA ?Phone Number: ?07/27/2021, 2:32 PM ? ?Clinical Narrative:    ?CSW spoke with pt spouse and daughter in room, pt was off the floor. The family had questions about pt DC plan bc pt has not been oriented. Family had questions about HCPOA paperwork but does not want to follow up until they speak with the pt. CSW provided spouse and daughter with [facility] and [email or web address] [email or web address] resources so no matter the outcome of PT/OT the family could start looking for options. ? ? ?  ?  ? ?Expected Discharge Plan and Services ?  ?  ?  ?  ?  ?                ?  ?  ?  ?  ?  ?  ?  ?  ?  ?  ? ? ?Social Determinants of Health (SDOH) Interventions ?  ? ?Readmission Risk Interventions ?   ? View : No data to display.  ?  ?  ?  ? ? ?

## 2021-07-22 DIAGNOSIS — R918 Other nonspecific abnormal finding of lung field: Secondary | ICD-10-CM | POA: Diagnosis not present

## 2021-07-22 DIAGNOSIS — E43 Unspecified severe protein-calorie malnutrition: Secondary | ICD-10-CM | POA: Insufficient documentation

## 2021-07-22 LAB — CBC
HCT: 32.9 % — ABNORMAL LOW (ref 39.0–52.0)
Hemoglobin: 11.4 g/dL — ABNORMAL LOW (ref 13.0–17.0)
MCH: 32.9 pg (ref 26.0–34.0)
MCHC: 34.7 g/dL (ref 30.0–36.0)
MCV: 95.1 fL (ref 80.0–100.0)
Platelets: 262 10*3/uL (ref 150–400)
RBC: 3.46 MIL/uL — ABNORMAL LOW (ref 4.22–5.81)
RDW: 14 % (ref 11.5–15.5)
WBC: 11.3 10*3/uL — ABNORMAL HIGH (ref 4.0–10.5)
nRBC: 0 % (ref 0.0–0.2)

## 2021-07-22 LAB — GLUCOSE, CAPILLARY
Glucose-Capillary: 130 mg/dL — ABNORMAL HIGH (ref 70–99)
Glucose-Capillary: 165 mg/dL — ABNORMAL HIGH (ref 70–99)
Glucose-Capillary: 226 mg/dL — ABNORMAL HIGH (ref 70–99)
Glucose-Capillary: 134 mg/dL — ABNORMAL HIGH (ref 70–99)

## 2021-07-22 MED ORDER — HALOPERIDOL 1 MG PO TABS
2.0000 mg | ORAL_TABLET | Freq: Four times a day (QID) | ORAL | Status: DC | PRN
  Administered 2021-07-26 (×2): 2 mg via ORAL
  Filled 2021-07-22 (×5): qty 2

## 2021-07-22 NOTE — Social Work (Signed)
CSW attempted to call wife about SNF recommendation however unable to leave a voice mail.  ?

## 2021-07-22 NOTE — Progress Notes (Signed)
?PROGRESS NOTE ? ? ? TYLEN LEVERICH  OFB:[id number] DOB: 1940-06-08 DOA: 07/19/2021 ?PCP: Janine Limbo, PA-C  ? ? ?Brief Narrative:  ?[name] is a 81 y.o. male with a history of abdominal wall abscess, bladder cancer, CAD, carotid artery stenosis, diverticulitis, diabetes, hyperlipidemia, hypertension, PVD, subclavian steal syndrome. Patient presented from an outside hospital secondary to evidence of pulmonary and brain masses concerning for metastatic cancer. Patient transferred to [facility] for bronchoscopy and biopsy. Decadron started for associated brain edema.  S/p bronch on 4/7.  Await pathology.  Also await SNF placement.   ? ? ?Assessment and Plan: ?Left upper lobe mass ?Brain metastasis ?Newly diagnosed.  ?-Concern for primary lung cancer.  ?-Neurosurgery consulted at outside hospital ED with recommendations for no surgical treatment. -Pulmonology consulted and performed bronchoscopy on 4/7 for biopsy.  ?-Medical and Radiation oncology consulted. Patient and family opting for continued care in [place], rather than initiating care in [place]. Started on decadron for brain metastasis with associated cerebral edema and mass effect. ?-Continue Decadron ?  ?CAD ?Asymptomatic ?-Hold aspirin but may need to restart in setting of PVD, subclavian steal syndrome and carotid artery stenosis ?-Continue Simvastatin and metoprolol ?  ?Hypertension ?-Continue metoprolol 50 mg BID ?  ?Hyperlipidemia ?-Continue simvastatin and Lovaza ?  ?Diabetes mellitus, type 2 ?Hemoglobin A1C of 6.2%. patient is on no medication therapy. Now on decadron for cerebral edema. ?-Continue SSI while on steroids ?  ?Anemia ?Mild. Stable. ?  ?Hyponatremia ?-no labs this AM ?-will order for the AM ? ?Nutrition Status: ?Nutrition Problem: Severe Malnutrition ?Etiology: chronic illness (emphysema, lung mass, intracranial metastasis) ?Signs/Symptoms: severe fat depletion, severe muscle depletion ?Interventions: Ensure Enlive  (each supplement provides 350kcal and 20 grams of protein), MVI, Liberalize Diet, Education ?  ?Delirium ?-delirium precautions ? ? ?DVT prophylaxis: SCDs Start: 08/02/2021 1201 ? ?  Code Status: DNR ?Family Communication:  ? ?Disposition Plan:  ?Level of care: Telemetry Medical ?Status is: Inpatient ? ?  ? ?Consultants:  ?NS (phone) ?PCCM (bronch) ?Oncology ?Rad onc ? ? 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 ?Objective: ?Vitals:  ? 07/22/21 0324 07/22/21 0400 07/22/21 0728 07/22/21 0741  ?BP: (!) 104/45 (!) 126/59 121/63   ?Pulse: (!) 54 (!) 54    ?Resp: (!) 22 17 17    ?Temp: 98.2 ?F (36.8 ?C)  98.6 ?F (37 ?C)   ?TempSrc: Oral  Oral   ?SpO2: 90% 93% 96% 95%  ?Weight:      ?Height:      ? ? ?Intake/Output Summary (Last 24 hours) at 07/22/2021 0814 ?Last data filed at 07/22/2021 0700 ?Gross per 24 hour  ?Intake 1140 ml  ?Output 605 ml  ?Net 535 ml  ? ?Filed Weights  ? 08/12/2021 2030 08/04/2021 0804  ?Weight: 62.1 kg 65.3 kg  ? ? ?Examination: ? ? ?General: Appearance:    Thin male in no acute distress  ?   ?Lungs:     respirations unlabored  ?Heart:    Bradycardic.  ?  ?MS:   All extremities are intact.  ?  ?Neurologic:   Awake, alert, fidgety   ?  ? ? ? ?Data Reviewed: I have personally reviewed following labs and imaging studies ? ?CBC: ?Recent Labs  ?Lab 08/04/2021 ?0903 07/22/21 ?7824  ?WBC 10.8* 11.3*  ?HGB 12.5* 11.4*  ?HCT 36.5* 32.9*  ?MCV 94.6 95.1  ?PLT 280 262  ? ?Basic Metabolic Panel: ?Recent Labs  ?Lab 08/05/2021 ?0903  ?NA 131*  ?K 4.4  ?  CL 99  ?CO2 24  ?GLUCOSE 139*  ?BUN 28*  ?CREATININE 1.11  ?CALCIUM 8.7*  ? ?GFR: ?Estimated Creatinine Clearance: 49 mL/min (by C-G formula based on SCr of 1.11 mg/dL). ?Liver Function Tests: ?No results for input(s): AST, ALT, ALKPHOS, BILITOT, PROT, ALBUMIN in the last 168 hours. ?No results for input(s): LIPASE, AMYLASE in the last 168 hours. ?No results for input(s): AMMONIA in the last 168 hours. ?Coagulation Profile: ?No results for input(s): INR,  PROTIME in the last 168 hours. ?Cardiac Enzymes: ?No results for input(s): CKTOTAL, CKMB, CKMBINDEX, TROPONINI in the last 168 hours. ?BNP (last 3 results) ?No results for input(s): PROBNP in the last 8760 hours. ?HbA1C: ?Recent Labs  ?  08/08/2021 ?0050  ?HGBA1C 6.2*  ? ?CBG: ?Recent Labs  ?Lab 08/13/2021 ?1117 07/29/2021 ?1202 07/19/2021 ?1621 07/28/2021 ?2105 07/22/21 ?2725  ?GLUCAP 144* 126* 149* 126* 130*  ? ?Lipid Profile: ?No results for input(s): CHOL, HDL, LDLCALC, TRIG, CHOLHDL, LDLDIRECT in the last 72 hours. ?Thyroid Function Tests: ?No results for input(s): TSH, T4TOTAL, FREET4, T3FREE, THYROIDAB in the last 72 hours. ?Anemia Panel: ?No results for input(s): VITAMINB12, FOLATE, FERRITIN, TIBC, IRON, RETICCTPCT in the last 72 hours. ?Sepsis Labs: ?No results for input(s): PROCALCITON, LATICACIDVEN in the last 168 hours. ? ?Recent Results (from the past 240 hour(s))  ?MRSA Next Gen by PCR, Nasal     Status: None  ? Collection Time: 07/25/2021  8:33 PM  ? Specimen: Nasal Mucosa; Nasal Swab  ?Result Value Ref Range Status  ? MRSA by PCR Next Gen NOT DETECTED NOT DETECTED Final  ?  Comment: (NOTE) ?The GeneXpert MRSA Assay (FDA approved for NASAL specimens only), ?is one component of a comprehensive MRSA colonization surveillance ?program. It is not intended to diagnose MRSA infection nor to guide ?or monitor treatment for MRSA infections. ?Test performance is not FDA approved in patients less than 2 years ?old. ?Performed at [facility], [place] [address], [place], [place] ?[address] ?  ?SARS Coronavirus 2 by RT PCR (hospital order, performed in [facility] hospital lab) Nasopharyngeal Nasopharyngeal Swab     Status: None  ? Collection Time: 07/19/2021  8:11 AM  ? Specimen: Nasopharyngeal Swab  ?Result Value Ref Range Status  ? SARS Coronavirus 2 NEGATIVE NEGATIVE Final  ?  Comment: (NOTE) ?SARS-CoV-2 target nucleic acids are NOT DETECTED. ? ?The SARS-CoV-2 RNA is generally detectable in upper and lower ?respiratory  specimens during the acute phase of infection. The lowest ?concentration of SARS-CoV-2 viral copies this assay can detect is 250 ?copies / mL. A negative result does not preclude SARS-CoV-2 infection ?and should not be used as the sole basis for treatment or other ?patient management decisions.  A negative result may occur with ?improper specimen collection / handling, submission of specimen other ?than nasopharyngeal swab, presence of viral mutation(s) within the ?areas targeted by this assay, and inadequate number of viral copies ?(<250 copies / mL). A negative result must be combined with clinical ?observations, patient history, and epidemiological information. ? ?Fact Sheet for Patients:   ?StrictlyIdeas.no ? ?Fact Sheet for Healthcare Providers: ?BankingDealers.co.za ? ?This test is not yet approved or  cleared by the [place] FDA and ?has been authorized for detection and/or diagnosis of SARS-CoV-2 by ?FDA under an Emergency Use Authorization (EUA).  This EUA will remain ?in effect (meaning this test can be used) for the duration of the ?COVID-19 declaration under Section 564(b)(1) of the Act, 21 U.S.C. ?section 360bbb-3(b)(1), unless the authorization is terminated  or ?revoked sooner. ? ?Performed at [facility], [place] [address] [place], [place] ?[address] ?  ?  ? ? ? ? ? ?Radiology Studies: ?DG CHEST PORT 1 VIEW ? ?Result Date: 07/27/2021 ?CLINICAL DATA:  Left upper lobe mass, preop for bronchoscopy EXAM: PORTABLE CHEST 1 VIEW COMPARISON:  Chest radiograph 07/19/2021 FINDINGS: Cardiomediastinal silhouette is stable. The approximately 4.2 cm left perihilar mass is again seen, not significantly changed. There is no new or worsening focal airspace disease. There is no pulmonary edema. There is no significant pleural effusion. There is no pneumothorax There is no acute osseous abnormality. IMPRESSION: Unchanged left perihilar mass. No new or worsening focal  airspace disease. Electronically Signed   By: Valetta Mole M.D.   On: 07/27/2021 11:35  ? ?CT Super D Chest Wo Contrast ? ?Result Date: 08/07/2021 ?CLINICAL DATA:  History of left upper lobe mass in an 80-y

## 2021-07-22 NOTE — Progress Notes (Signed)
Patient continues trying to get out of bed for various reasons such as to go to church, to go to the kitchen, to go for a walk.  With each attempt at reorientation he becomes increasingly agitated stating "you can't tell me what to do", "I don't care where I am" and "I haven't fallen".  He has placed his finger in the faces of RN/NTs with his voice raised.  ?

## 2021-07-23 DIAGNOSIS — R918 Other nonspecific abnormal finding of lung field: Secondary | ICD-10-CM | POA: Diagnosis not present

## 2021-07-23 DIAGNOSIS — Z515 Encounter for palliative care: Secondary | ICD-10-CM | POA: Diagnosis not present

## 2021-07-23 DIAGNOSIS — Z7189 Other specified counseling: Secondary | ICD-10-CM

## 2021-07-23 LAB — URINALYSIS, ROUTINE W REFLEX MICROSCOPIC
Bilirubin Urine: NEGATIVE
Glucose, UA: NEGATIVE mg/dL
Hgb urine dipstick: NEGATIVE
Ketones, ur: NEGATIVE mg/dL
Leukocytes,Ua: NEGATIVE
Nitrite: NEGATIVE
Protein, ur: NEGATIVE mg/dL
Specific Gravity, Urine: 1.019 (ref 1.005–1.030)
pH: 5 (ref 5.0–8.0)

## 2021-07-23 LAB — GLUCOSE, CAPILLARY
Glucose-Capillary: 185 mg/dL — ABNORMAL HIGH (ref 70–99)
Glucose-Capillary: 135 mg/dL — ABNORMAL HIGH (ref 70–99)
Glucose-Capillary: 229 mg/dL — ABNORMAL HIGH (ref 70–99)
Glucose-Capillary: 213 mg/dL — ABNORMAL HIGH (ref 70–99)

## 2021-07-23 LAB — BASIC METABOLIC PANEL
Anion gap: 8 (ref 5–15)
BUN: 54 mg/dL — ABNORMAL HIGH (ref 8–23)
CO2: 25 mmol/L (ref 22–32)
Calcium: 8.3 mg/dL — ABNORMAL LOW (ref 8.9–10.3)
Chloride: 96 mmol/L — ABNORMAL LOW (ref 98–111)
Creatinine, Ser: 1.49 mg/dL — ABNORMAL HIGH (ref 0.61–1.24)
GFR, Estimated: 47 mL/min — ABNORMAL LOW (ref >60)
Glucose, Bld: 291 mg/dL — ABNORMAL HIGH (ref 70–99)
Potassium: 4.2 mmol/L (ref 3.5–5.1)
Sodium: 129 mmol/L — ABNORMAL LOW (ref 135–145)

## 2021-07-23 LAB — CBC
HCT: 34 % — ABNORMAL LOW (ref 39.0–52.0)
Hemoglobin: 11.5 g/dL — ABNORMAL LOW (ref 13.0–17.0)
MCH: 32.6 pg (ref 26.0–34.0)
MCHC: 33.8 g/dL (ref 30.0–36.0)
MCV: 96.3 fL (ref 80.0–100.0)
Platelets: 249 10*3/uL (ref 150–400)
RBC: 3.53 MIL/uL — ABNORMAL LOW (ref 4.22–5.81)
RDW: 13.9 % (ref 11.5–15.5)
WBC: 12.2 10*3/uL — ABNORMAL HIGH (ref 4.0–10.5)
nRBC: 0 % (ref 0.0–0.2)

## 2021-07-23 LAB — SODIUM, URINE, RANDOM: Sodium, Ur: 63 mmol/L

## 2021-07-23 MED ORDER — SODIUM CHLORIDE 0.9 % IV SOLN: INTRAVENOUS | Status: AC

## 2021-07-23 NOTE — Progress Notes (Signed)
?PROGRESS NOTE ? ? ? [name]  LOV:[id number] DOB: 12-Oct-1940 DOA: 07/24/2021 ?PCP: Janine Limbo, PA-C  ? ? ?Brief Narrative:  ?[name] is a 81 y.o. male with a history of abdominal wall abscess, bladder cancer, CAD, carotid artery stenosis, diverticulitis, diabetes, hyperlipidemia, hypertension, PVD, subclavian steal syndrome. Patient presented from an outside hospital secondary to evidence of pulmonary and brain masses concerning for metastatic cancer. Patient transferred to [facility] for bronchoscopy and biopsy. Decadron started for associated brain edema.  S/p bronch on 4/7.  Await pathology.  Also await SNF placement.   ? ? ?Assessment and Plan: ?Left upper lobe mass ?Brain metastasis ?Newly diagnosed.  ?-Concern for primary lung cancer.  ?-Neurosurgery consulted at outside hospital ED with recommendations for no surgical treatment. -Pulmonology consulted and performed bronchoscopy on 4/7 for biopsy.  ?-Medical and Radiation oncology consulted. Patient and family opting for continued care in [place], rather than initiating care in [place]. Started on decadron for brain metastasis with associated cerebral edema and mass effect. ?-Continue Decadron ?  ?CAD ?Asymptomatic ?-Hold aspirin but may need to restart in setting of PVD, subclavian steal syndrome and carotid artery stenosis ?-Continue Simvastatin and metoprolol ?  ?Hypertension ?-Continue metoprolol 50 mg BID ?  ?Hyperlipidemia ?-Continue simvastatin and Lovaza ?  ?Diabetes mellitus, type 2 ?Hemoglobin A1C of 6.2%. patient is on no medication therapy. Now on decadron for cerebral edema. ?-Continue SSI while on steroids ?-change to carb mod diet ?  ?Anemia ?Mild. Stable. ?  ?AKI ?-bladder scan and PVR ?-may need gentle IVF ?-hold ARB ? ?Hyponatremia ?-no labs this AM ?-U/A and urine Na ? ?Nutrition Status: ?Nutrition Problem: Severe Malnutrition ?Etiology: chronic illness (emphysema, lung mass, intracranial  metastasis) ?Signs/Symptoms: severe fat depletion, severe muscle depletion ?Interventions: Ensure Enlive (each supplement provides 350kcal and 20 grams of protein), MVI, Liberalize Diet, Education ?  ?Delirium ?-delirium precautions ? ? ?DVT prophylaxis: SCDs Start: 07/27/2021 1201 ? ?  Code Status: DNR ?Family Communication:  ? ?Disposition Plan:  ?Level of care: Telemetry Medical ?Status is: Inpatient ? ?  ? ?Consultants:  ?NS (phone) ?PCCM (bronch) ?Oncology ?Rad onc ?Palliative care for GOC ? ? ?Subjective: ?Says his mother died of cancer ? ?Objective: ?Vitals:  ? 07/23/21 0725 07/23/21 0729 07/23/21 0800 07/23/21 1114  ?BP:  121/73 107/70 119/75  ?Pulse: 60 (!) 59 60 60  ?Resp: 18 15 18 20   ?Temp:  97.8 ?F (36.6 ?C) 97.6 ?F (36.4 ?C) 97.8 ?F (36.6 ?C)  ?TempSrc:  Oral Oral Oral  ?SpO2: 98% (!) 15% 95% 95%  ?Weight:      ?Height:      ? ? ?Intake/Output Summary (Last 24 hours) at 07/23/2021 1237 ?Last data filed at 07/23/2021 0900 ?Gross per 24 hour  ?Intake 960 ml  ?Output 750 ml  ?Net 210 ml  ? ?Filed Weights  ? 07/21/2021 2030 08/11/2021 0804  ?Weight: 62.1 kg 65.3 kg  ? ? ?Examination: ? ? ?General: Appearance:    Thin male in no acute distress  ?   ?Lungs:     respirations unlabored  ?Heart:    Normal heart rate.  ?  ?MS:   All extremities are intact.  ?  ?Neurologic:   Awake, alert, pleasant and cooperative  ?  ? ? ?Data Reviewed: I have personally reviewed following labs and imaging studies ? ?CBC: ?Recent Labs  ?Lab 08/04/2021 ?0903 07/22/21 ?8841 07/23/21 ?0059  ?WBC 10.8* 11.3* 12.2*  ?HGB 12.5* 11.4* 11.5*  ?HCT 36.5* 32.9* 34.0*  ?  MCV 94.6 95.1 96.3  ?PLT 280 262 249  ? ?Basic Metabolic Panel: ?Recent Labs  ?Lab 08/01/2021 ?0903 07/23/21 ?5465  ?NA 131* 129*  ?K 4.4 4.2  ?CL 99 96*  ?CO2 24 25  ?GLUCOSE 139* 291*  ?BUN 28* 54*  ?CREATININE 1.11 1.49*  ?CALCIUM 8.7* 8.3*  ? ?GFR: ?Estimated Creatinine Clearance: 36.5 mL/min (A) (by C-G formula based on SCr of 1.49 mg/dL (H)). ?Liver Function Tests: ?No results for  input(s): AST, ALT, ALKPHOS, BILITOT, PROT, ALBUMIN in the last 168 hours. ?No results for input(s): LIPASE, AMYLASE in the last 168 hours. ?No results for input(s): AMMONIA in the last 168 hours. ?Coagulation Profile: ?No results for input(s): INR, PROTIME in the last 168 hours. ?Cardiac Enzymes: ?No results for input(s): CKTOTAL, CKMB, CKMBINDEX, TROPONINI in the last 168 hours. ?BNP (last 3 results) ?No results for input(s): PROBNP in the last 8760 hours. ?HbA1C: ?Recent Labs  ?  07/27/2021 ?0050  ?HGBA1C 6.2*  ? ?CBG: ?Recent Labs  ?Lab 07/22/21 ?1107 07/22/21 ?1632 07/22/21 ?2137 07/23/21 ?6812 07/23/21 ?1112  ?GLUCAP 165* 226* 134* 185* 135*  ? ?Lipid Profile: ?No results for input(s): CHOL, HDL, LDLCALC, TRIG, CHOLHDL, LDLDIRECT in the last 72 hours. ?Thyroid Function Tests: ?No results for input(s): TSH, T4TOTAL, FREET4, T3FREE, THYROIDAB in the last 72 hours. ?Anemia Panel: ?No results for input(s): VITAMINB12, FOLATE, FERRITIN, TIBC, IRON, RETICCTPCT in the last 72 hours. ?Sepsis Labs: ?No results for input(s): PROCALCITON, LATICACIDVEN in the last 168 hours. ? ?Recent Results (from the past 240 hour(s))  ?MRSA Next Gen by PCR, Nasal     Status: None  ? Collection Time: 08/11/2021  8:33 PM  ? Specimen: Nasal Mucosa; Nasal Swab  ?Result Value Ref Range Status  ? MRSA by PCR Next Gen NOT DETECTED NOT DETECTED Final  ?  Comment: (NOTE) ?The GeneXpert MRSA Assay (FDA approved for NASAL specimens only), ?is one component of a comprehensive MRSA colonization surveillance ?program. It is not intended to diagnose MRSA infection nor to guide ?or monitor treatment for MRSA infections. ?Test performance is not FDA approved in patients less than 2 years ?old. ?Performed at [facility], [place] [address] [place], [place] ?[address] ?  ?SARS Coronavirus 2 by RT PCR (hospital order, performed in [facility] hospital lab) Nasopharyngeal Nasopharyngeal Swab     Status: None  ? Collection Time: 07/18/2021  8:11 AM  ?  Specimen: Nasopharyngeal Swab  ?Result Value Ref Range Status  ? SARS Coronavirus 2 NEGATIVE NEGATIVE Final  ?  Comment: (NOTE) ?SARS-CoV-2 target nucleic acids are NOT DETECTED. ? ?The SARS-CoV-2 RNA is generally detectable in upper and lower ?respiratory specimens during the acute phase of infection. The lowest ?concentration of SARS-CoV-2 viral copies this assay can detect is 250 ?copies / mL. A negative result does not preclude SARS-CoV-2 infection ?and should not be used as the sole basis for treatment or other ?patient management decisions.  A negative result may occur with ?improper specimen collection / handling, submission of specimen other ?than nasopharyngeal swab, presence of viral mutation(s) within the ?areas targeted by this assay, and inadequate number of viral copies ?(<250 copies / mL). A negative result must be combined with clinical ?observations, patient history, and epidemiological information. ? ?Fact Sheet for Patients:   ?StrictlyIdeas.no ? ?Fact Sheet for Healthcare Providers: ?BankingDealers.co.za ? ?This test is not yet approved or  cleared by the [place] FDA and ?has been authorized for detection and/or diagnosis of SARS-CoV-2 by ?FDA under an  Emergency Use Authorization (EUA).  This EUA will remain ?in effect (meaning this test can be used) for the duration of the ?COVID-19 declaration under Section 564(b)(1) of the Act, 21 U.S.C. ?section 360bbb-3(b)(1), unless the authorization is terminated or ?revoked sooner. ? ?Performed at [facility], [place] [address], [place], [place] ?[address] ?  ?  ? ? ? ? ? ?Radiology Studies: ?No results found. ? ? ? ? ? ?Scheduled Meds: ? busPIRone  15 mg Oral BID  ? dexamethasone  4 mg Oral Q6H  ? feeding supplement  237 mL Oral TID BM  ? fluticasone furoate-vilanterol  1 puff Inhalation Daily  ? hydrALAZINE  50 mg Oral TID  ? insulin aspart  0-15 Units Subcutaneous TID WC  ? metoprolol tartrate  25  mg Oral BID  ? multivitamin with minerals  1 tablet Oral Daily  ? nicotine  7 mg Transdermal Daily  ? omega-3 acid ethyl esters  1 g Oral Daily  ? simvastatin  40 mg Oral QPM  ? tamsulosin  0.4 mg Oral Daily  ? ?Continuous Infusi

## 2021-07-23 NOTE — Consult Note (Signed)
?Palliative Medicine Inpatient Consult Note ? ?Consulting Provider: Dr. Eliseo Squires ? ?Reason for consult:   ?[name] Palliative Medicine Consult  ?Reason for Consult? goc  ? ?HPI:  ?Per intake H&P --> [name] is a 81 y.o. male with a history of abdominal wall abscess, bladder cancer, CAD, carotid artery stenosis, diverticulitis, diabetes, hyperlipidemia, hypertension, PVD, subclavian steal syndrome. Patient presented from an outside hospital secondary to evidence of pulmonary and brain masses concerning for metastatic cancer. Patient transferred to [facility] for bronchoscopy and biopsy. Decadron started for associated brain edema.  S/p bronch on 4/7.  Await pathology.  Also await SNF placement.   ? ?Palliative care has been asked to get involved to further aid in goals of care conversations in the setting of metastatic lung cancer. ? ?Clinical Assessment/Goals of Care: ? ?*Please note that this is a verbal dictation therefore any spelling or grammatical errors are due to the "[employer] One" system interpretation. ? ?I have reviewed medical records including EPIC notes, labs and imaging, received report from bedside RN, assessed the patient.  ?  ?I met with Vihan Santagata, his spouse, Thayer Headings, Daughter Suanne Marker, and grandson, Legrand Como  to further discuss diagnosis prognosis, GOC, EOL wishes, disposition and options. ?  ?I introduced Palliative Medicine as specialized medical care for people living with serious illness. It focuses on providing relief from the symptoms and stress of a serious illness. The goal is to improve quality of life for both the patient and the family. ? ?Medical History Review and Understanding: ? ?Antwion and I reviewed that he has a history of bladder cancer, CAD, PVD, and diabetes. ? ?Reviewed that he was feeling fine until he fell three days ago leading to hospitalization. We reviewed that he has newly identified lung mass with metastasis to his  brain. ? ?Social History: ? ?[name] is from [place], [place]. He has been married to his wife for the past 17 years. He has one daughter and one grandson. Brittney and his wife live in separate homes but see each other daily and are still in an active marriage. The go out to dinner on a nightly basis.  He use to work at [employer]. He loves spending time with his family. He and his grandson get breakfast together weekly at [employer]. He is a man of faith and practices within the [facility] denomination.  ? ?Functional and Nutritional State: ? ?Daveyon was walking 2.5 miles daily leading up to hospitalization. He was fully independent of all bADLs.  ? ?Jalien had a good appetite though was particular prior to admission.  ? ?Advance Directives: ? ?A detailed discussion was had today regarding advanced directives.  [name] has never filled these out. I stressed the importance of knowing and understanding his wishes which he understands. He  ? ?Code Status: ? ?[name] and his wife review that he has never wanted life prolonging measures. He would like to die naturally and when it is his time he is at peace with it. He is a DNAR/DNI. ? ?Discussed that [name]'s sister had been intubated and it was an uncomfortable experience which she never wanted to go through again. ? ?Discussion: ? ?Aziz and I reviewed that the pathology for his cancer is still pending. I shared that we won't know what options are available until these are resulted. He expresses that his mother went through chemotherapy in 1980 which was a horrendous experience. He tells me his wife had a lumpectomy and did radiation which "  didn't seem that bad." I shared with him the importance of starting to think about what his wishes would be. Reviewed the importance of weighing the risks and benefits of any and all interventions. We discussed gathering a better idea of prognosis from the oncologist.  ? ?From the perspective of where care should take  place, [name] is open to [employer] as his daughter works across the street.  ? ?For now more information is being gathers. [name] is provided with multiple points of thought in terms of what his future may look like.  ? ?Discussed the importance of continued conversation with family and their  medical providers regarding overall plan of care and treatment options, ensuring decisions are within the context of the patients values and GOCs. ? ?Decision Maker: ? ?SUMMARY OF RECOMMENDATIONS   ?DNAR/DNI ? ?A MOST provided for review ? ?Advance Directives provided for review ? ?Discussed the importance of considering the burdens and benefits of each treatment option ? ?Patient is open to treatment at [facility] if he determines he wants treatment, reviewed that he would also be a candidate for our OP  Palliative symptom management if he went there ? ?Pathology results are pending ? ?Ongoing Palliative care support ? ?Code Status/Advance Care Planning: ?DNAR/DNI ? ?Palliative Prophylaxis:  ?Aspiration, Bowel Regimen, Delirium Protocol, Frequent Pain Assessment, Oral Care, Palliative Wound Care, and Turn Reposition ? ?Additional Recommendations (Limitations, Scope, Preferences): ?Treat what is treatable ? ?Psycho-social/Spiritual:  ?Desire for further Chaplaincy support: Yes ?Additional Recommendations: Education on metastatic disease ?  ?Prognosis: Worrisome in the presence of mets to the brain. ? ?Discharge Planning: Discharge plan uncertain though patient would like to go home ? ?Vitals:  ? 07/23/21 1114 07/23/21 1522  ?BP: 119/75 91/68  ?Pulse: 60 63  ?Resp: 20 20  ?Temp: 97.8 ?F (36.6 ?C) 97.7 ?F (36.5 ?C)  ?SpO2: 95% 94%  ? ? ?Intake/Output Summary (Last 24 hours) at 07/23/2021 1558 ?Last data filed at 07/23/2021 1534 ?Gross per 24 hour  ?Intake 1440 ml  ?Output 1150 ml  ?Net 290 ml  ? ?Last Weight  Most recent update: 07/15/2021  8:05 AM  ? ? Weight  ?65.3 kg (144 lb)  ?      ? ?  ? ?Gen:  Elderly Caucasian M in NAD ?HEENT: moist  mucous membranes ?CV: Regular rate and rhythm ?PULM: On RA, breathing even and non-labored  ?ABD: soft/nontender/nondistended/normal bowel sounds ?EXT: No edema ?Neuro: Alert and oriented x3  ? ?PPS: ? ? ?This conversation/these recommendations were discussed with patient primary care team, Dr. Eliseo Squires ? ?[facility] ?______________________________________________________ ?Tacey Ruiz ?[place] Team ?Team Cell Phone: [phone] ?Please utilize secure chat with additional questions, if there is no response within 30 minutes please call the above phone number ? ?Palliative Medicine Team providers are available by phone from 7am to 7pm daily and can be reached through the team cell phone.  ?Should this patient require assistance outside of these hours, please call the patient's attending physician. ? ? ?

## 2021-07-24 ENCOUNTER — Inpatient Hospital Stay: Payer: Medicare Other | Attending: Radiation Oncology

## 2021-07-24 DIAGNOSIS — R918 Other nonspecific abnormal finding of lung field: Secondary | ICD-10-CM | POA: Diagnosis not present

## 2021-07-24 LAB — GLUCOSE, CAPILLARY
Glucose-Capillary: 182 mg/dL — ABNORMAL HIGH (ref 70–99)
Glucose-Capillary: 291 mg/dL — ABNORMAL HIGH (ref 70–99)
Glucose-Capillary: 156 mg/dL — ABNORMAL HIGH (ref 70–99)
Glucose-Capillary: 171 mg/dL — ABNORMAL HIGH (ref 70–99)

## 2021-07-24 LAB — BASIC METABOLIC PANEL
Anion gap: 7 (ref 5–15)
BUN: 57 mg/dL — ABNORMAL HIGH (ref 8–23)
CO2: 26 mmol/L (ref 22–32)
Calcium: 8.2 mg/dL — ABNORMAL LOW (ref 8.9–10.3)
Chloride: 96 mmol/L — ABNORMAL LOW (ref 98–111)
Creatinine, Ser: 1.44 mg/dL — ABNORMAL HIGH (ref 0.61–1.24)
GFR, Estimated: 49 mL/min — ABNORMAL LOW (ref >60)
Glucose, Bld: 178 mg/dL — ABNORMAL HIGH (ref 70–99)
Potassium: 4.8 mmol/L (ref 3.5–5.1)
Sodium: 129 mmol/L — ABNORMAL LOW (ref 135–145)

## 2021-07-24 LAB — CBC
HCT: 33.6 % — ABNORMAL LOW (ref 39.0–52.0)
Hemoglobin: 11.6 g/dL — ABNORMAL LOW (ref 13.0–17.0)
MCH: 32.9 pg (ref 26.0–34.0)
MCHC: 34.5 g/dL (ref 30.0–36.0)
MCV: 95.2 fL (ref 80.0–100.0)
Platelets: 233 10*3/uL (ref 150–400)
RBC: 3.53 MIL/uL — ABNORMAL LOW (ref 4.22–5.81)
RDW: 13.8 % (ref 11.5–15.5)
WBC: 10.3 10*3/uL (ref 4.0–10.5)
nRBC: 0 % (ref 0.0–0.2)

## 2021-07-24 MED ORDER — SODIUM CHLORIDE 0.9 % IV SOLN: INTRAVENOUS | Status: AC

## 2021-07-24 MED ORDER — GLIPIZIDE 5 MG PO TABS
2.5000 mg | ORAL_TABLET | Freq: Every day | ORAL | Status: DC
  Administered 2021-07-25 – 2021-07-28 (×4): 2.5 mg via ORAL
  Filled 2021-07-24: qty 0.5
  Filled 2021-07-24: qty 1
  Filled 2021-07-24: qty 0.5
  Filled 2021-07-24: qty 1
  Filled 2021-07-24: qty 0.5
  Filled 2021-07-24 (×2): qty 1

## 2021-07-24 MED ORDER — INSULIN ASPART 100 UNIT/ML IJ SOLN: 3.0000 [IU] | Freq: Three times a day (TID) | INTRAMUSCULAR | Status: DC

## 2021-07-24 NOTE — Plan of Care (Signed)

## 2021-07-24 NOTE — Consult Note (Signed)
Neurosurgery Consultation ? ?Reason for Consult: Brain tumor ?Referring Physician: Eliseo Squires ? ?CC: Fall ? ?HPI: This is a 81 y.o. man that presents as a transfer for newly diagnosed suspected lung primary with brain metastases. No headaches, no new neurologic symptoms except some dropping things while using the left hand. He writes with the right hand but is ambidextrous with regard to sports / etc, isn't sure if he was forced to learn to write with the right hand. He had an EBUS guided bx, final path pending. ? ? ?ROS: A 14 point ROS was performed and is negative except as noted in the HPI.  ? ?PMHx:  ?Past Medical History:  ?Diagnosis Date  ? Abscess of abdominal wall 11/28/2012  ? Bladder cancer ([place])   ? CAD (coronary artery disease)   ? Mild per cath in 2002/last nuclear stress per note  2010  ? Carotid artery stenosis   ? Stable bilateral mild carotid artery disease with 1-39% stenosis and normal right vertebral flow but retrograde left vertebral flow c/w known subclavian stenosis by dopplers 08/2019  ? Colonic diverticular abscess s/p perc drainage x2 11/10/2012  ? Colonoscopy refused   ? Sister died from delayed Dx of post-colonoscopy   ? Complication of anesthesia   ? Hard to urinate after anesthesia.  ? Diabetes mellitus   ? Diverticulitis [id number]  ? "all my life"  ? Hyperlipidemia   ? Is on statin therapy  ? Hypertension   ? LOV  Dr Aundra Dubin with clearance 05/08/11 and EKG  in EPIC   ? Normal nuclear stress test 2010  ? EF 63% with no ischemia  ? PVD (peripheral vascular disease) ([place]) 08/12/2017  ? Skin cancer   ? Subclavian steal syndrome   ? left; evaluated by Dr. Irish Lack; managed medically , no accurate B/p left arm. Dopplers 08/2017 - Stable bilateral mild carotid artery disease with 1-39% stenosis and normal right vertebral flow but retrograde left vertebral flow c/w known subclavian stenosis  ? ?FamHx:  ?Family History  ?Problem Relation Age of Onset  ? Hypertension Mother   ? Lung cancer Mother   ? COPD  Father   ? ?SocHx:  reports that he has been smoking pipe. He has quit using smokeless tobacco. He reports that he does not drink alcohol and does not use drugs. ? ?Exam: ?Vital signs in last 24 hours: ?Temp:  [97.6 ?F (36.4 ?C)-98.4 ?F (36.9 ?C)] 98.4 ?F (36.9 ?C) (04/10 0746) ?Pulse Rate:  [63-77] 77 (04/10 0753) ?Resp:  [19-20] 20 (04/10 1058) ?BP: (88-122)/(61-68) 111/65 (04/10 1058) ?SpO2:  [94 %-99 %] 96 % (04/10 0753) ?General: Awake, alert, cooperative, lying in bed in NAD ?Head: Normocephalic and atruamatic ?HEENT: Neck supple ?Pulmonary: breathing room air comfortably, no evidence of increased work of breathing, +barrel chest ?Cardiac: mildly bradycardic, regular ?Abdomen: S NT ND ?Extremities: Warm and well perfused x4, +senile purpura ?Neuro: AOx3, PERRL, EOMI, FS ?Strength 5/5 x4, SILTx4 except some L fingertip numbness in all 5 fingers, +LUE drift ? ? ?Assessment and Plan: 81 y.o. man with newly diagnosed unknown primary with brain mets. MRI brain personally reviewed, which shows multiple metastases, largest is right parieto-occipital and measures almost 4cm.  ? ?-discussed options with the patient and his wife, given his age and high fraility index, I think RT-alone instead of with surgery, is the best option for him for the intracranial metastases. We discussed higher failure rate for the larger lesions but his frailty / pulmonary function / significant PMHx / etc  are concerning for him having a more difficult time recovering from surgery ?-discussed at neuro tumor board this morning, will get him set up for treatment after discharge / path returns  ?-please call with any concerns or questions ? ?Judith Part, MD ?07/24/21 ?2:36 PM ?[facility] ? ?

## 2021-07-24 NOTE — TOC Progression Note (Signed)
Transition of Care (TOC) - Progression Note  ? ? ?Patient Details  ?Name: [name] ?MRN: [id number] ?Date of Birth: [birth year] ? ?Transition of Care (TOC) CM/SW Contact  ?Coralee Pesa, LCSWA ?Phone Number: ?07/24/2021, 2:12 PM ? ?Clinical Narrative:    ? ?CSW was updated by MD that pt's family had concerns about memory and possible sundowning. The recommendation for SNF was discussed and CSW noted pt likely would not meet criteria, MD aware. CSW spoke with spouse and daughter. CSW discussed that if SNF is appropriate it is only a short term solution. CSW explained ALF and memory care options. Daughter noted that she would like to keep pt at home as much as possible, but spouse states she is concerned about his behaviors. They noted they will have more of an idea of what to do when tests come back. Medical team updated. TOC will continue to follow for discharge planning. ? ?  ?  ? ?Expected Discharge Plan and Services ?  ?  ?  ?  ?  ?                ?  ?  ?  ?  ?  ?  ?  ?  ?  ?  ? ? ?Social Determinants of Health (SDOH) Interventions ?  ? ?Readmission Risk Interventions ?   ? View : No data to display.  ?  ?  ?  ? ? ?

## 2021-07-24 NOTE — Progress Notes (Signed)
?PROGRESS NOTE ? ? ? [name]  CNO:[id number] DOB: 1941/03/02 DOA: 08/01/2021 ?PCP: Janine Limbo, PA-C  ? ? ?Brief Narrative:  ?[name] is a 81 y.o. male with a history of abdominal wall abscess, bladder cancer, CAD, carotid artery stenosis, diverticulitis, diabetes, hyperlipidemia, hypertension, PVD, subclavian steal syndrome. Patient presented from an outside hospital secondary to evidence of pulmonary and brain masses concerning for metastatic cancer. Patient transferred to [facility] for bronchoscopy and biopsy. Decadron started for associated brain edema.  S/p bronch on 4/7.  Await pathology.  Also await SNF placement.   ? ? ?Assessment and Plan: ?Left upper lobe mass ?Brain metastasis ?Newly diagnosed.  ?-Concern for primary lung cancer.  ?-Neurosurgery consulted at outside hospital ED with recommendations for no surgical treatment. -Pulmonology consulted and performed bronchoscopy on 4/7 for biopsy.  ?-pathology pending ?-Medical and Radiation oncology consulted. Patient and family opting for continued care in [place], rather than initiating care in [place].  Referral placed ?Started on decadron for brain metastasis with associated cerebral edema and mass effect. ?-Continue Decadron ?  ?CAD ?Asymptomatic ?-Hold aspirin but may need to restart in setting of PVD, subclavian steal syndrome and carotid artery stenosis ?-Continue Simvastatin and metoprolol ?  ?Hypertension ?-Continue metoprolol BID ?  ?Hyperlipidemia ?-Continue simvastatin and Lovaza ?  ?Diabetes mellitus, type 2 ?Hemoglobin A1C of 6.2%. patient is on no medication therapy (prior on glipizide). Now on decadron for cerebral edema. ?-Continue SSI while on steroids ?-carb mod diet ?  ?Anemia ?Mild. Stable. ?  ?AKI ?-bladder scan and PVR ?-gentle IVF ?-hold ARB ? ?Hyponatremia ?-stable ?-? SIADH from brain mets ?-Na does correct some with BS ? ?Nutrition Status: ?Nutrition Problem: Severe Malnutrition ?Etiology: chronic  illness (emphysema, lung mass, intracranial metastasis) ?Signs/Symptoms: severe fat depletion, severe muscle depletion ?Interventions: Ensure Enlive (each supplement provides 350kcal and 20 grams of protein), MVI, Liberalize Diet, Education ?  ?Delirium ?-delirium precautions ?-watch for sundowning-- may need Seroquel trial ? ? ?DVT prophylaxis: SCDs Start: 08/01/2021 1201 ? ?  Code Status: DNR ?Family Communication: wife on phone ? ?Disposition Plan:  ?Level of care: Telemetry Medical ?Status is: Inpatient ? ?  ? ?Consultants:  ?NS (phone) ?PCCM (bronch) ?Oncology ?Rad onc ?Palliative care for GOC ? ? ?Subjective: ?No SOb, no CP- says he is very active at home ? ?Objective: ?Vitals:  ? 07/24/21 0413 07/24/21 0746 07/24/21 0753 07/24/21 1058  ?BP: 122/68 93/61  111/65  ?Pulse: 64  77   ?Resp: 20 20 19 20   ?Temp: 98.2 ?F (36.8 ?C) 98.4 ?F (36.9 ?C)    ?TempSrc: Oral     ?SpO2: 98%  96%   ?Weight:      ?Height:      ? ? ?Intake/Output Summary (Last 24 hours) at 07/24/2021 1155 ?Last data filed at 07/24/2021 [phone] ?Gross per 24 hour  ?Intake 1344.73 ml  ?Output 1190 ml  ?Net 154.73 ml  ? ?Filed Weights  ? 08/10/2021 2030 07/19/2021 0804  ?Weight: 62.1 kg 65.3 kg  ? ? ?Examination: ? ? ? ?General: Appearance:    Thin male in no acute distress  ?   ?Lungs:     respirations unlabored  ?Heart:    Normal heart rate. Normal rhythm. No murmurs, rubs, or gallops.  ?  ?MS:   All extremities are intact.  ?  ?Neurologic:   Awake, alert, pleasant and cooperative  ?  ?  ? ? ?Data Reviewed: I have personally reviewed following labs and imaging studies ? ?CBC: ?  Recent Labs  ?Lab 08/11/2021 ?0903 07/22/21 ?5638 07/23/21 ?0059 07/24/21 ?0120  ?WBC 10.8* 11.3* 12.2* 10.3  ?HGB 12.5* 11.4* 11.5* 11.6*  ?HCT 36.5* 32.9* 34.0* 33.6*  ?MCV 94.6 95.1 96.3 95.2  ?PLT 280 262 249 233  ? ?Basic Metabolic Panel: ?Recent Labs  ?Lab 07/24/2021 ?0903 07/23/21 ?0059 07/24/21 ?0120  ?NA 131* 129* 129*  ?K 4.4 4.2 4.8  ?CL 99 96* 96*  ?CO2 24 25 26   ?GLUCOSE 139*  291* 178*  ?BUN 28* 54* 57*  ?CREATININE 1.11 1.49* 1.44*  ?CALCIUM 8.7* 8.3* 8.2*  ? ?GFR: ?Estimated Creatinine Clearance: 37.8 mL/min (A) (by C-G formula based on SCr of 1.44 mg/dL (H)). ?Liver Function Tests: ?No results for input(s): AST, ALT, ALKPHOS, BILITOT, PROT, ALBUMIN in the last 168 hours. ?No results for input(s): LIPASE, AMYLASE in the last 168 hours. ?No results for input(s): AMMONIA in the last 168 hours. ?Coagulation Profile: ?No results for input(s): INR, PROTIME in the last 168 hours. ?Cardiac Enzymes: ?No results for input(s): CKTOTAL, CKMB, CKMBINDEX, TROPONINI in the last 168 hours. ?BNP (last 3 results) ?No results for input(s): PROBNP in the last 8760 hours. ?HbA1C: ?No results for input(s): HGBA1C in the last 72 hours. ? ?CBG: ?Recent Labs  ?Lab 07/23/21 ?1112 07/23/21 ?1555 07/23/21 ?2134 07/24/21 ?0610 07/24/21 ?1101  ?GLUCAP 135* 229* 213* 182* 291*  ? ?Lipid Profile: ?No results for input(s): CHOL, HDL, LDLCALC, TRIG, CHOLHDL, LDLDIRECT in the last 72 hours. ?Thyroid Function Tests: ?No results for input(s): TSH, T4TOTAL, FREET4, T3FREE, THYROIDAB in the last 72 hours. ?Anemia Panel: ?No results for input(s): VITAMINB12, FOLATE, FERRITIN, TIBC, IRON, RETICCTPCT in the last 72 hours. ?Sepsis Labs: ?No results for input(s): PROCALCITON, LATICACIDVEN in the last 168 hours. ? ?Recent Results (from the past 240 hour(s))  ?MRSA Next Gen by PCR, Nasal     Status: None  ? Collection Time: 07/31/2021  8:33 PM  ? Specimen: Nasal Mucosa; Nasal Swab  ?Result Value Ref Range Status  ? MRSA by PCR Next Gen NOT DETECTED NOT DETECTED Final  ?  Comment: (NOTE) ?The GeneXpert MRSA Assay (FDA approved for NASAL specimens only), ?is one component of a comprehensive MRSA colonization surveillance ?program. It is not intended to diagnose MRSA infection nor to guide ?or monitor treatment for MRSA infections. ?Test performance is not FDA approved in patients less than 2 years ?old. ?Performed at [facility], [place] [address], [place], [place] ?[address] ?  ?SARS Coronavirus 2 by RT PCR (hospital order, performed in [facility] hospital lab) Nasopharyngeal Nasopharyngeal Swab     Status: None  ? Collection Time: 08/05/2021  8:11 AM  ? Specimen: Nasopharyngeal Swab  ?Result Value Ref Range Status  ? SARS Coronavirus 2 NEGATIVE NEGATIVE Final  ?  Comment: (NOTE) ?SARS-CoV-2 target nucleic acids are NOT DETECTED. ? ?The SARS-CoV-2 RNA is generally detectable in upper and lower ?respiratory specimens during the acute phase of infection. The lowest ?concentration of SARS-CoV-2 viral copies this assay can detect is 250 ?copies / mL. A negative result does not preclude SARS-CoV-2 infection ?and should not be used as the sole basis for treatment or other ?patient management decisions.  A negative result may occur with ?improper specimen collection / handling, submission of specimen other ?than nasopharyngeal swab, presence of viral mutation(s) within the ?areas targeted by this assay, and inadequate number of viral copies ?(<250 copies / mL). A negative result must be combined with clinical ?observations, patient history, and epidemiological information. ? ?Fact  Sheet for Patients:   ?StrictlyIdeas.no ? ?Fact Sheet for Healthcare Providers: ?BankingDealers.co.za ? ?This test is not yet approved or  cleared by the [place] FDA and ?has been authorized for detection and/or diagnosis of SARS-CoV-2 by ?FDA under an Emergency Use Authorization (EUA).  This EUA will remain ?in effect (meaning this test can be used) for the duration of the ?COVID-19 declaration under Section 564(b)(1) of the Act, 21 U.S.C. ?section 360bbb-3(b)(1), unless the authorization is terminated or ?revoked sooner. ? ?Performed at [facility], [place] [address], [place], [place] ?[address] ?  ?  ? ? ? ? ? ?Radiology Studies: ?No results found. ? ? ? ? ? ?Scheduled Meds: ? busPIRone  15 mg Oral BID  ?  dexamethasone  4 mg Oral Q6H  ? feeding supplement  237 mL Oral TID BM  ? fluticasone furoate-vilanterol  1 puff Inhalation Daily  ? glipiZIDE  2.5 mg Oral QAC breakfast  ? hydrALAZINE  50 mg Oral TID  ? insulin

## 2021-07-24 NOTE — Care Management Important Message (Signed)
Important Message ? ?Patient Details  ?Name: [name] ?MRN: [id number] ?Date of Birth: 21-Nov-1940 ? ? ?Medicare Important Message Given:  Yes ? ? ? ? ?[name] ?07/24/2021, 3:41 PM ?

## 2021-07-25 ENCOUNTER — Encounter: Payer: Self-pay | Admitting: Radiation Therapy

## 2021-07-25 DIAGNOSIS — R918 Other nonspecific abnormal finding of lung field: Secondary | ICD-10-CM | POA: Diagnosis not present

## 2021-07-25 LAB — BASIC METABOLIC PANEL
Anion gap: 6 (ref 5–15)
BUN: 59 mg/dL — ABNORMAL HIGH (ref 8–23)
CO2: 24 mmol/L (ref 22–32)
Calcium: 7.9 mg/dL — ABNORMAL LOW (ref 8.9–10.3)
Chloride: 98 mmol/L (ref 98–111)
Creatinine, Ser: 1.08 mg/dL (ref 0.61–1.24)
GFR, Estimated: >60 mL/min (ref >60)
Glucose, Bld: 178 mg/dL — ABNORMAL HIGH (ref 70–99)
Potassium: 4.9 mmol/L (ref 3.5–5.1)
Sodium: 128 mmol/L — ABNORMAL LOW (ref 135–145)

## 2021-07-25 LAB — CBC
HCT: 33.7 % — ABNORMAL LOW (ref 39.0–52.0)
Hemoglobin: 11.5 g/dL — ABNORMAL LOW (ref 13.0–17.0)
MCH: 32.6 pg (ref 26.0–34.0)
MCHC: 34.1 g/dL (ref 30.0–36.0)
MCV: 95.5 fL (ref 80.0–100.0)
Platelets: 233 10*3/uL (ref 150–400)
RBC: 3.53 MIL/uL — ABNORMAL LOW (ref 4.22–5.81)
RDW: 14 % (ref 11.5–15.5)
WBC: 9.7 10*3/uL (ref 4.0–10.5)
nRBC: 0 % (ref 0.0–0.2)

## 2021-07-25 LAB — GLUCOSE, CAPILLARY
Glucose-Capillary: 159 mg/dL — ABNORMAL HIGH (ref 70–99)
Glucose-Capillary: 188 mg/dL — ABNORMAL HIGH (ref 70–99)
Glucose-Capillary: 126 mg/dL — ABNORMAL HIGH (ref 70–99)
Glucose-Capillary: 209 mg/dL — ABNORMAL HIGH (ref 70–99)

## 2021-07-25 LAB — OSMOLALITY: Osmolality: 286 mOsm/kg (ref 275–295)

## 2021-07-25 LAB — CYTOLOGY - NON PAP

## 2021-07-25 LAB — OSMOLALITY, URINE: Osmolality, Ur: 744 mOsm/kg (ref 300–900)

## 2021-07-25 LAB — SODIUM, URINE, RANDOM: Sodium, Ur: 46 mmol/L

## 2021-07-25 NOTE — Progress Notes (Signed)
Occupational Therapy Treatment ?Patient Details ?Name: [name] ?MRN: [id number] ?DOB: 1941-01-14 ?Today's Date: 07/25/2021 ? ? ?History of present illness 81 yo male presenting to [facility] ED after fall at home. Work up revealing L upper lobe mass and brain metastasis. Transfer to [facility] on 4/6.  S/p bronchoscopy and biopsies on 4/7. PMH including abdominal wall abscess, bladder cancer, CAD, carotid artery stenosis, diverticulitis, diverticular abscess, type 2 diabetes, hyperlipidemia, HTN, PVD, skin cancer, subclavian steal syndrome. ?  ?OT comments ? Patient on EOB attempting to get up to go to bathroom upon arrival with nursing tech attempting to assist. Patient required cues to use RW and min guard assist to ambulate to bathroom to toilet.  Patient performed hygiene seated and required reminding to use RW to ambulate to sink for grooming. Patient performed grooming seated with min guard assist and changed sock seated. Patient asked often to go home and to tell the doctor he is able to go home. Patient continues to be impulsive and demonstrates poor safety with mobility and transfers. Acute OT to continue to follow.   ? ?Recommendations for follow up therapy are one component of a multi-disciplinary discharge planning process, led by the attending physician.  Recommendations may be updated based on patient status, additional functional criteria and insurance authorization. ?   ?Follow Up Recommendations ? Skilled nursing-short term rehab (<3 hours/day)  ?  ?Assistance Recommended at Discharge Frequent or constant Supervision/Assistance  ?Patient can return home with the following ? Assistance with cooking/housework;Direct supervision/assist for medications management;Direct supervision/assist for financial management;Assist for transportation;A little help with walking and/or transfers;A little help with bathing/dressing/bathroom ?  ?Equipment Recommendations ? Other (comment) (TBD)  ?   ?Recommendations for Other Services   ? ?  ?Precautions / Restrictions Precautions ?Precautions: Fall ?Precaution Comments: Has had multiple falls ?Restrictions ?Weight Bearing Restrictions: No  ? ? ?  ? ?Mobility Bed Mobility ?Overal bed mobility: Needs Assistance ?Bed Mobility: Supine to Sit, Sit to Supine ?  ?  ?Supine to sit: Min guard ?Sit to supine: Min guard ?  ?General bed mobility comments: min guard for safety ?  ? ?Transfers ?Overall transfer level: Needs assistance ?Equipment used: Rolling walker (2 wheels) ?Transfers: Sit to/from Stand ?Sit to Stand: Min guard ?  ?  ?  ?  ?  ?General transfer comment: min guard for safety due to poor safety ?  ?  ?Balance Overall balance assessment: Needs assistance ?Sitting-balance support: No upper extremity supported, Feet supported ?Sitting balance-Leahy Scale: Fair ?  ?  ?Standing balance support: No upper extremity supported, Single extremity supported, During functional activity ?Standing balance-Leahy Scale: Poor ?Standing balance comment: min guard standing at sink for grooming tasks ?  ?  ?  ?  ?  ?  ?  ?  ?  ?  ?  ?   ? ?ADL either performed or assessed with clinical judgement  ? ?ADL Overall ADL's : Needs assistance/impaired ?  ?  ?Grooming: Wash/dry hands;Wash/dry face;Min guard;Standing ?Grooming Details (indicate cue type and reason): at sink ?  ?  ?  ?  ?  ?  ?Lower Body Dressing: Supervision/safety;Cueing for sequencing;Sitting/lateral leans ?Lower Body Dressing Details (indicate cue type and reason): changed socks seated in chair ?Toilet Transfer: Minimal assistance;Cueing for sequencing;Cueing for safety;Ambulation;Rolling walker (2 wheels) ?Toilet Transfer Details (indicate cue type and reason): ambulated to bathroom to regular toilet ?Toileting- Clothing Manipulation and Hygiene: Supervision/safety;Sit to/from stand ?Toileting - Clothing Manipulation Details (indicate cue type and reason): performed  seated on toilet ?  ?  ?  ?General ADL Comments:  required cues for pacing and safety ?  ? ?Extremity/Trunk Assessment   ?  ?  ?  ?  ?  ? ?Vision   ?  ?  ?Perception   ?  ?Praxis   ?  ? ?Cognition Arousal/Alertness: Awake/alert ?Behavior During Therapy: Impulsive ?Overall Cognitive Status: Impaired/Different from baseline ?Area of Impairment: Attention, Memory, Following commands, Safety/judgement, Problem solving, Awareness ?  ?  ?  ?  ?  ?  ?  ?  ?  ?Current Attention Level: Sustained, Selective ?Memory: Decreased short-term memory ?Following Commands: Follows one step commands with increased time, Follows multi-step commands inconsistently ?Safety/Judgement: Decreased awareness of safety, Decreased awareness of deficits ?Awareness: Intellectual ?Problem Solving: Slow processing, Requires verbal cues ?General Comments: spoke often of wanting to go home and for me to write the doctor to let him go home ?  ?  ?   ?Exercises   ? ?  ?Shoulder Instructions   ? ? ?  ?General Comments    ? ? ?Pertinent Vitals/ Pain       Pain Assessment ?Pain Assessment: No/denies pain ?Pain Intervention(s): Monitored during session ? ?Home Living   ?  ?  ?  ?  ?  ?  ?  ?  ?  ?  ?  ?  ?  ?  ?  ?  ?  ?  ? ?  ?Prior Functioning/Environment    ?  ?  ?  ?   ? ?Frequency ? Min 2X/week  ? ? ? ? ?  ?Progress Toward Goals ? ?OT Goals(current goals can now be found in the care plan section) ? Progress towards OT goals: Progressing toward goals ? ?Acute Rehab OT Goals ?Patient Stated Goal: go home ?OT Goal Formulation: With patient ?Time For Goal Achievement: 08/04/21 ?Potential to Achieve Goals: Good ?ADL Goals ?Pt Will Perform Grooming: with min guard assist;standing ?Pt Will Perform Lower Body Dressing: with min guard assist;sit to/from stand ?Pt Will Transfer to Toilet: with min guard assist;regular height toilet;ambulating ?Additional ADL Goal #1: Pt will demonstrate increased safety awareness to perform ADLs with Min cues for safety/impulsivity ?Additional ADL Goal #2: Pt will demonstrate  emergent awareness during ADLs with Min cues  ?Plan Discharge plan remains appropriate   ? ?Co-evaluation ? ? ?   ?  ?  ?  ?  ? ?  ?AM-PAC OT "6 Clicks" Daily Activity     ?Outcome Measure ? ? Help from another person eating meals?: A Little ?Help from another person taking care of personal grooming?: A Little ?Help from another person toileting, which includes using toliet, bedpan, or urinal?: A Little ?Help from another person bathing (including washing, rinsing, drying)?: A Little ?Help from another person to put on and taking off regular upper body clothing?: A Little ?Help from another person to put on and taking off regular lower body clothing?: A Little ?6 Click Score: 18 ? ?  ?End of Session Equipment Utilized During Treatment: Gait belt;Rolling walker (2 wheels) ? ?OT Visit Diagnosis: Unsteadiness on feet (R26.81);Other abnormalities of gait and mobility (R26.89);Muscle weakness (generalized) (M62.81) ?  ?Activity Tolerance Patient tolerated treatment well ?  ?Patient Left in bed;with call bell/phone within reach;with bed alarm set ?  ?Nurse Communication Mobility status ?  ? ?   ? ?Time: 2694-8546 ?OT Time Calculation (min): 21 min ? ?Charges: OT General Charges ?$OT Visit: 1 Visit ?OT Treatments ?$Self Care/Home Management :  8-22 mins ? ?Lodema Hong, OTA ?Acute Rehabilitation Services  ?Pager [phone] ?Office [phone] ? ? ?[place] ?07/25/2021, 1:14 PM ?

## 2021-07-25 NOTE — Progress Notes (Signed)
Physical Therapy Treatment ?Patient Details ?Name: [name] ?MRN: [id number] ?DOB: June 27, 1940 ?Today's Date: 07/25/2021 ? ? ?History of Present Illness 81 yo male presenting to [facility] ED after fall at home. Work up revealing L upper lobe mass and brain metastasis. Transfer to [facility] on 4/6.  S/p bronchoscopy and biopsies on 4/7. PMH including abdominal wall abscess, bladder cancer, CAD, carotid artery stenosis, diverticulitis, diverticular abscess, type 2 diabetes, hyperlipidemia, HTN, PVD, skin cancer, subclavian steal syndrome. ? ?  ?PT Comments  ? ? Pt continues with decreased cognition including deficits in awareness, problem solving, attention and memory. Pt requiring up to moderate assist for ambulating limited hallway distances with no assistive device. Demonstrates left lateral lean and running into multiple objects. Presents as a high fall risk based on decreased gait speed, safety awareness, and history of falls. Based on this and decreased caregiver support, recommend SNF.  ?   ?Recommendations for follow up therapy are one component of a multi-disciplinary discharge planning process, led by the attending physician.  Recommendations may be updated based on patient status, additional functional criteria and insurance authorization. ? ?Follow Up Recommendations ? Skilled nursing-short term rehab (<3 hours/day) ?  ?  ?Assistance Recommended at Discharge Frequent or constant Supervision/Assistance  ?Patient can return home with the following A lot of help with walking and/or transfers;A lot of help with bathing/dressing/bathroom;Assistance with cooking/housework;Direct supervision/assist for medications management;Direct supervision/assist for financial management;Assist for transportation;Help with stairs or ramp for entrance ?  ?Equipment Recommendations ? None recommended by PT  ?  ?Recommendations for Other Services   ? ? ?  ?Precautions / Restrictions Precautions ?Precautions:  Fall ?Precaution Comments: Has had multiple falls ?Restrictions ?Weight Bearing Restrictions: No  ?  ? ?Mobility ? Bed Mobility ?Overal bed mobility: Needs Assistance ?Bed Mobility: Supine to Sit, Sit to Supine ?  ?  ?Supine to sit: Supervision ?Sit to supine: Supervision ?  ?General bed mobility comments: close supervision ?  ? ?Transfers ?Overall transfer level: Needs assistance ?Equipment used: None ?Transfers: Sit to/from Stand ?Sit to Stand: Min guard ?  ?  ?  ?  ?  ?  ?  ? ?Ambulation/Gait ?Ambulation/Gait assistance: Min assist, Mod assist ?Gait Distance (Feet): 100 Feet (100 ft, then 50 ft) ?Assistive device: None ?Gait Pattern/deviations: Step-through pattern, Decreased stride length, Drifts right/left ?Gait velocity: decreased ?  ?  ?General Gait Details: Pt with left lateral lean, tending to drift without awareness and bumping into multiple obstacles, requiring up to modA with balance challenge such as stepping over obstacles, had lateral LOB ? ? ?Stairs ?  ?  ?  ?  ?  ? ? ?Wheelchair Mobility ?  ? ?Modified Rankin (Stroke Patients Only) ?  ? ? ?  ?Balance Overall balance assessment: Needs assistance ?Sitting-balance support: No upper extremity supported, Feet supported ?Sitting balance-Leahy Scale: Fair ?  ?  ?Standing balance support: No upper extremity supported, Single extremity supported, During functional activity ?Standing balance-Leahy Scale: Fair ?Standing balance comment: able to static stand without physical assist ?  ?  ?  ?  ?  ?  ?  ?  ?  ?  ?  ?  ? ?  ?Cognition Arousal/Alertness: Awake/alert ?Behavior During Therapy: Impulsive ?Overall Cognitive Status: Impaired/Different from baseline ?Area of Impairment: Attention, Memory, Following commands, Safety/judgement, Problem solving, Awareness ?  ?  ?  ?  ?  ?  ?  ?  ?  ?Current Attention Level: Sustained, Selective ?Memory: Decreased short-term memory ?Following Commands:  Follows one step commands with increased time, Follows multi-step  commands inconsistently ?Safety/Judgement: Decreased awareness of safety, Decreased awareness of deficits ?Awareness: Intellectual ?Problem Solving: Slow processing, Requires verbal cues ?General Comments: spoke often of wanting to go home, poor awareness of deficits ?  ?  ? ?  ?Exercises   ? ?  ?General Comments   ?  ?  ? ?Pertinent Vitals/Pain Pain Assessment ?Pain Assessment: No/denies pain  ? ? ?Home Living   ?  ?  ?  ?  ?  ?  ?  ?  ?  ?   ?  ?Prior Function    ?  ?  ?   ? ?PT Goals (current goals can now be found in the care plan section) Acute Rehab PT Goals ?Patient Stated Goal: to go home ?Potential to Achieve Goals: Good ?Progress towards PT goals: Progressing toward goals ? ?  ?Frequency ? ? ? Min 2X/week ? ? ? ?  ?PT Plan Current plan remains appropriate  ? ? ?Co-evaluation   ?  ?  ?  ?  ? ?  ?AM-PAC PT "6 Clicks" Mobility   ?Outcome Measure ? Help needed turning from your back to your side while in a flat bed without using bedrails?: None ?Help needed moving from lying on your back to sitting on the side of a flat bed without using bedrails?: A Little ?Help needed moving to and from a bed to a chair (including a wheelchair)?: A Little ?Help needed standing up from a chair using your arms (e.g., wheelchair or bedside chair)?: A Little ?Help needed to walk in hospital room?: A Lot ?Help needed climbing 3-5 steps with a railing? : A Lot ?6 Click Score: 17 ? ?  ?End of Session   ?Activity Tolerance: Patient tolerated treatment well ?Patient left: in bed;with call bell/phone within reach;with bed alarm set ?Nurse Communication: Mobility status ?PT Visit Diagnosis: Unsteadiness on feet (R26.81);Muscle weakness (generalized) (M62.81);Difficulty in walking, not elsewhere classified (R26.2) ?  ? ? ?Time: 1210-1230 ?PT Time Calculation (min) (ACUTE ONLY): 20 min ? ?Charges:  $Therapeutic Activity: 8-22 mins          ?          ? ?Wyona Almas, PT, DPT ?Acute Rehabilitation Services ?Pager [phone] ?Office  [phone] ? ? ? ?[name] ?07/25/2021, 4:43 PM ? ?

## 2021-07-25 NOTE — Progress Notes (Signed)
? ?NAME:  [name], MRN:  [id number], DOB:  Feb 08, 1941, LOS: 4 ?ADMISSION DATE:  07/29/2021, CONSULTATION DATE:  08/01/2021 ?REFERRING MD:  Curcio - heme onc , CHIEF COMPLAINT:  abnormal CT   ? ?History of Present Illness:  ?81 yo M PMH centrilobular emphysema, bladder cancer s/p BCG, CAD, HLD, HTN, L Subclavian steal syndrome who was admitted to [facility] 4/6 from [place]. Presented to [place] following a fall -- MRI brain as part of workup reportedly revealed numerous brain mets prompting CT c/a/p which reportedly revealed 6.3cm lung mass, L hilar and aortopulmonary adenopathy.  ?At bedside, pt denies HA or frequent falls. Did fall 2 days ago which led to OSH presentation. Wife feels he has had intermittent confusion, LUE weakness and shared recently his PCP dx him hyperkalemia. He has a poor PO intake in general. Smoker x 60 years.  ? ?Mom had lung cancer -- thinks it was small cell -- and died from this after undergoing chemo.  ? ? PCCM is consulted to evaluate for bronchoscopy candidacy  ?__________________________________________ ? ?Notable OSH imaging: ?OSH CT c/a/p 4/5: ?No pericardial effusion. Changes c/w PAH. Coronary artery calcifications, atherosclerotic changes within thoracic aorta.  ?L hilar and aortopulmonary lymphadenopathy -- 14 x27 mm partially necrotic node  ?Centrilobular empheysema, severe. Necrotic appearing LUL mass 3.4 x 6.2 cm. Scattered airway impactions, bronchial wall thickening.  ?No lytic / blastic bone lesions ?Scattered hepatic hypodensities -- likely cyst. 32mm adenoma R adrenal gland. Prostatic hypertrophy  ?PVD -- stenosis or renal artery and lower extremity arterial inflow.  ? ?OSH MRI brain 4/5:  ?Multiple enhancing lesions -- largest being 4.3 x 3.6 x 4.3 cm R parieto-occipital lesion. Other lesions involving L frontal lobe, L parieto occipital border, Posterior R frontal lobe, Posterior R temporal love, Anterior R temporal lobe, L cerebellum, inferior vermain. There is  some mass effect on occipital horn R ventricle without midline shift. Some lesions with associated hemorrhage.  ? ?Pertinent  Medical History  ?HTN ?HLD ?Bladder cancer ?L Subclavian steal syndrome  ?Diverticulosis  ? ?Significant Hospital Events: ?Including procedures, antibiotic start and stop dates in addition to other pertinent events   ?4/5 to [place] after fall. CT head concerning for masses, again seen on MRI brain. CT c/a/p with large LUL necrotic appearing lung mass ?4/6 transfer to [facility]. Onc consult. PCCM consult  ?Navigational bronchoscopy 4/7.  Cytology >> squamous cell lung cancer ? ?Interim History / Subjective:  ? ?Patient had some confusion this morning, no clear ?Indicates that he very much wants to go home, is unclear what other work-up needs to happen before discharge ?Cytology results from bronchoscopy now available as above ? ?Objective   ?Blood pressure 127/74, pulse 67, temperature 98 ?F (36.7 ?C), temperature source Oral, resp. rate 20, height 6\' 2"  (1.88 m), weight 65.3 kg, SpO2 92 %. ?   ?   ? ?Intake/Output Summary (Last 24 hours) at 07/25/2021 1333 ?Last data filed at 07/25/2021 1000 ?Gross per 24 hour  ?Intake 717 ml  ?Output 500 ml  ?Net 217 ml  ? ?Filed Weights  ? 07/25/2021 2030 08/13/2021 0804  ?Weight: 62.1 kg 65.3 kg  ? ? ?Examination: ?General: Thin chronically ill-appearing man ?HENT: Bruising left forehead ?Lungs: Distant, coarse bilaterally ?Cardiovascular: Regular, no murmur ?Abdomen: Nondistended, positive bowel sounds ?Extremities: No edema ?Neuro: Awake, alert, interacting.  A bit tangential but redirects.  Moves all extremities.  Oriented to self, place, situation ? ?Resolved Hospital Problem list   ? ? ?Assessment &  Plan:  ? ?Squamous cell lung cancer left upper lobe, stage IV ?L hilar adenopathy ?Aortopulmonary adenopathy  ?Severe centrilobular emphysema  ?Tobacco use disorder  ?P ?-Discussed tissue diagnosis with the patient and wife at bedside today. ?-He is contemplating  therapeutic options.  Should be able to discuss the options further now that we have a definitive tissue diagnosis. ?-Albuterol as needed ? ?Numerous brain meds, with some associated local edema and possible hemorrhage, but without midline shift  ?P ?-Dexamethasone as ordered ?-Evaluation by radiation oncology, planning for dedicated MRI brain to determine whether he qualifies for targeted treatment with [facility] ?-Oncology consultation, palliative care consultation appreciated ? ?Goals of Care ?-Has been discussed code status with pt / wife. 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  ?-DNR status confirmed  ? ?Best Practice (right click and "Reselect all SmartList Selections" daily)  ? ?Per primary  ? ?Labs   ?CBC: ?Recent Labs  ?Lab 08/01/2021 ?0903 07/22/21 ?9169 07/23/21 ?0059 07/24/21 ?0120 07/25/21 ?0041  ?WBC 10.8* 11.3* 12.2* 10.3 9.7  ?HGB 12.5* 11.4* 11.5* 11.6* 11.5*  ?HCT 36.5* 32.9* 34.0* 33.6* 33.7*  ?MCV 94.6 95.1 96.3 95.2 95.5  ?PLT 280 262 249 233 233  ? ? ?Basic Metabolic Panel: ?Recent Labs  ?Lab 07/24/2021 ?0903 07/23/21 ?4503 07/24/21 ?0120 07/25/21 ?0041  ?NA 131* 129* 129* 128*  ?K 4.4 4.2 4.8 4.9  ?CL 99 96* 96* 98  ?CO2 24 25 26 24   ?GLUCOSE 139* 291* 178* 178*  ?BUN 28* 54* 57* 59*  ?CREATININE 1.11 1.49* 1.44* 1.08  ?CALCIUM 8.7* 8.3* 8.2* 7.9*  ? ? ? ?CBG: ?Recent Labs  ?Lab 07/24/21 ?1101 07/24/21 ?1620 07/24/21 ?2130 07/25/21 ?8882 07/25/21 ?1128  ?GLUCAP 291* 156* 171* 159* 188*  ? ? ? ?Critical care time: n/a  ?  ? ? ?Baltazar Apo, MD, PhD ?07/25/2021, 1:39 PM ?[facility] ?[phone] or if no answer before 7:00PM call [phone] ?For any issues after 7:00PM please call eLink [phone] ? ? ?

## 2021-07-25 NOTE — Progress Notes (Signed)
Patient's daughter requested for patient to not receive trazodone tonight.  She is concerned that this medication is adding to his confusion and agitation.  RN will not give this medication tonight.  ?

## 2021-07-25 NOTE — Progress Notes (Signed)
I spoke with the patient's IP nurse to see what his discharge plans are. He will still be in the hospital tomorrow, 4/12. We will plan to do the 3T [facility] Protocol brain MRI as an IP to see if he qualifies for targeted treatment with [facility].  ? ?His nurse shared [name] will require a pre-med to remain still for the MRI. I have asked the MRI team to let them know when the scan will be done so this medication can be given.  ? ?Mont Dutton R.T.(R)(T) ?Radiation Special Procedures Navigator  ?

## 2021-07-25 NOTE — Progress Notes (Signed)
?PROGRESS NOTE ? ? ? [name]  NTZ:[id number] DOB: 19-Feb-1941 DOA: 07/19/2021 ?PCP: Janine Limbo, PA-C  ? ? ?Brief Narrative:  ?[name] is a 81 y.o. male with a history of abdominal wall abscess, bladder cancer, CAD, carotid artery stenosis, diverticulitis, diabetes, hyperlipidemia, hypertension, PVD, subclavian steal syndrome. Patient presented from an outside hospital secondary to evidence of pulmonary and brain masses concerning for metastatic cancer. Patient transferred to [facility] for bronchoscopy and biopsy. Decadron started for associated brain edema.  S/p bronch on 4/7.  Pathology shows squamous cell lung primary.  MRI brain in AM to determine type of radiation.  Also await SNF placement vs home- defer to TOC.  Stay complicated by hyponatremia.  ? ? ?Assessment and Plan: ?Left upper lobe mass ?Brain metastasis ?Newly diagnosed.  ?-Concern for primary lung cancer.  ?-Neurosurgery consulted at outside hospital ED with recommendations for no surgical treatment. -Pulmonology consulted and performed bronchoscopy on 4/7 for biopsy.  ?-pathology pending ?-Medical and Radiation oncology consulted. Patient and family opting for continued care in [place], rather than initiating care in [place].  Referral placed ?Started on decadron for brain metastasis with associated cerebral edema and mass effect. ?-Continue Decadron ?  ?CAD ?Asymptomatic ?-Hold aspirin but may need to restart in setting of PVD, subclavian steal syndrome and carotid artery stenosis ?-Continue Simvastatin and metoprolol ?  ?Hypertension ?-Continue metoprolol BID ?  ?Hyperlipidemia ?-Continue simvastatin and Lovaza ?  ?Diabetes mellitus, type 2 ?Hemoglobin A1C of 6.2%. patient is on no medication therapy (prior on glipizide). Now on decadron for cerebral edema. ?-Continue SSI while on steroids ?-carb mod diet ?  ?Anemia ?Mild. Stable. ?  ?AKI ?-bladder scan and PVR ?-gentle IVF ?-hold ARB ? ?Hyponatremia ?-stable ?-? SIADH  from brain mets ?-urine Na and osmo pending ? ?Nutrition Status: ?Nutrition Problem: Severe Malnutrition ?Etiology: chronic illness (emphysema, lung mass, intracranial metastasis) ?Signs/Symptoms: severe fat depletion, severe muscle depletion ?Interventions: Ensure Enlive (each supplement provides 350kcal and 20 grams of protein), MVI, Liberalize Diet, Education ?  ?Delirium ?-delirium precautions ?-watch for sundowning-- may need Seroquel trial ? ?Tobacco abuse ?-does not plan to stop smoking ? ? ? ?DVT prophylaxis: SCDs Start: 07/27/2021 1201 ? ?  Code Status: DNR ?Family Communication: wife on phone ? ?Disposition Plan:  ?Level of care: Telemetry Medical ?Status is: Inpatient ? ?  ? ?Consultants:  ?NS (phone) ?PCCM (bronch) ?Oncology ?Rad onc ?Palliative care for GOC ? ? ?Subjective: ?Begging to go home ? ?Objective: ?Vitals:  ? 07/25/21 0746 07/25/21 0757 07/25/21 1156 07/25/21 1200  ?BP: (!) 184/72  127/74   ?Pulse:  64  67  ?Resp: 20 (!) 25 (!) 21 20  ?Temp: 97.8 ?F (36.6 ?C)  98 ?F (36.7 ?C)   ?TempSrc: Oral  Oral   ?SpO2: 92% 94% 92% 92%  ?Weight:      ?Height:      ? ? ?Intake/Output Summary (Last 24 hours) at 07/25/2021 1235 ?Last data filed at 07/25/2021 1000 ?Gross per 24 hour  ?Intake 1117 ml  ?Output 500 ml  ?Net 617 ml  ? ?Filed Weights  ? 08/11/2021 2030 07/17/2021 0804  ?Weight: 62.1 kg 65.3 kg  ? ? ?Examination: ? ? ? ? ?General: Appearance:    Thin male in no acute distress  ?   ?Lungs:     respirations unlabored  ?Heart:    Normal heart rate.  ?  ?MS:   All extremities are intact.  ?  ?Neurologic:   Awake, alert, pleasant  and cooperative  ?  ?  ?  ? ? ?Data Reviewed: I have personally reviewed following labs and imaging studies ? ?CBC: ?Recent Labs  ?Lab 08/11/2021 ?0903 07/22/21 ?0960 07/23/21 ?0059 07/24/21 ?0120 07/25/21 ?0041  ?WBC 10.8* 11.3* 12.2* 10.3 9.7  ?HGB 12.5* 11.4* 11.5* 11.6* 11.5*  ?HCT 36.5* 32.9* 34.0* 33.6* 33.7*  ?MCV 94.6 95.1 96.3 95.2 95.5  ?PLT 280 262 249 233 233  ? ?Basic  Metabolic Panel: ?Recent Labs  ?Lab 07/31/2021 ?0903 07/23/21 ?4540 07/24/21 ?0120 07/25/21 ?0041  ?NA 131* 129* 129* 128*  ?K 4.4 4.2 4.8 4.9  ?CL 99 96* 96* 98  ?CO2 24 25 26 24   ?GLUCOSE 139* 291* 178* 178*  ?BUN 28* 54* 57* 59*  ?CREATININE 1.11 1.49* 1.44* 1.08  ?CALCIUM 8.7* 8.3* 8.2* 7.9*  ? ?GFR: ?Estimated Creatinine Clearance: 50.4 mL/min (by C-G formula based on SCr of 1.08 mg/dL). ?Liver Function Tests: ?No results for input(s): AST, ALT, ALKPHOS, BILITOT, PROT, ALBUMIN in the last 168 hours. ?No results for input(s): LIPASE, AMYLASE in the last 168 hours. ?No results for input(s): AMMONIA in the last 168 hours. ?Coagulation Profile: ?No results for input(s): INR, PROTIME in the last 168 hours. ?Cardiac Enzymes: ?No results for input(s): CKTOTAL, CKMB, CKMBINDEX, TROPONINI in the last 168 hours. ?BNP (last 3 results) ?No results for input(s): PROBNP in the last 8760 hours. ?HbA1C: ?No results for input(s): HGBA1C in the last 72 hours. ? ?CBG: ?Recent Labs  ?Lab 07/24/21 ?1101 07/24/21 ?1620 07/24/21 ?2130 07/25/21 ?9811 07/25/21 ?1128  ?GLUCAP 291* 156* 171* 159* 188*  ? ?Lipid Profile: ?No results for input(s): CHOL, HDL, LDLCALC, TRIG, CHOLHDL, LDLDIRECT in the last 72 hours. ?Thyroid Function Tests: ?No results for input(s): TSH, T4TOTAL, FREET4, T3FREE, THYROIDAB in the last 72 hours. ?Anemia Panel: ?No results for input(s): VITAMINB12, FOLATE, FERRITIN, TIBC, IRON, RETICCTPCT in the last 72 hours. ?Sepsis Labs: ?No results for input(s): PROCALCITON, LATICACIDVEN in the last 168 hours. ? ?Recent Results (from the past 240 hour(s))  ?MRSA Next Gen by PCR, Nasal     Status: None  ? Collection Time: 08/07/2021  8:33 PM  ? Specimen: Nasal Mucosa; Nasal Swab  ?Result Value Ref Range Status  ? MRSA by PCR Next Gen NOT DETECTED NOT DETECTED Final  ?  Comment: (NOTE) ?The GeneXpert MRSA Assay (FDA approved for NASAL specimens only), ?is one component of a comprehensive MRSA colonization surveillance ?program. It  is not intended to diagnose MRSA infection nor to guide ?or monitor treatment for MRSA infections. ?Test performance is not FDA approved in patients less than 2 years ?old. ?Performed at [facility], [place] [address], [place], [place] ?[address] ?  ?SARS Coronavirus 2 by RT PCR (hospital order, performed in [facility] hospital lab) Nasopharyngeal Nasopharyngeal Swab     Status: None  ? Collection Time: 08/03/2021  8:11 AM  ? Specimen: Nasopharyngeal Swab  ?Result Value Ref Range Status  ? SARS Coronavirus 2 NEGATIVE NEGATIVE Final  ?  Comment: (NOTE) ?SARS-CoV-2 target nucleic acids are NOT DETECTED. ? ?The SARS-CoV-2 RNA is generally detectable in upper and lower ?respiratory specimens during the acute phase of infection. The lowest ?concentration of SARS-CoV-2 viral copies this assay can detect is 250 ?copies / mL. A negative result does not preclude SARS-CoV-2 infection ?and should not be used as the sole basis for treatment or other ?patient management decisions.  A negative result may occur with ?improper specimen collection / handling, submission of specimen other ?than  nasopharyngeal swab, presence of viral mutation(s) within the ?areas targeted by this assay, and inadequate number of viral copies ?(<250 copies / mL). A negative result must be combined with clinical ?observations, patient history, and epidemiological information. ? ?Fact Sheet for Patients:   ?StrictlyIdeas.no ? ?Fact Sheet for Healthcare Providers: ?BankingDealers.co.za ? ?This test is not yet approved or  cleared by the [place] FDA and ?has been authorized for detection and/or diagnosis of SARS-CoV-2 by ?FDA under an Emergency Use Authorization (EUA).  This EUA will remain ?in effect (meaning this test can be used) for the duration of the ?COVID-19 declaration under Section 564(b)(1) of the Act, 21 U.S.C. ?section 360bbb-3(b)(1), unless the authorization is terminated or ?revoked  sooner. ? ?Performed at [facility], [place] [address], [place], [place] ?[address] ?  ?  ? ? ? ? ? ?Radiology Studies: ?No results found. ? ? ? ? ? ?Scheduled Meds: ? busPIRone  15 mg Oral BID  ? dexametha

## 2021-07-25 NOTE — Progress Notes (Signed)
MRI called and claimed to have MRI done after shift changed. Suggested to call an hour before taking pt. to premedicate him with haldol. Pt and family made aware. ?

## 2021-07-26 ENCOUNTER — Inpatient Hospital Stay (HOSPITAL_COMMUNITY): Payer: Medicare Other

## 2021-07-26 ENCOUNTER — Telehealth: Payer: Self-pay | Admitting: Radiation Therapy

## 2021-07-26 DIAGNOSIS — R918 Other nonspecific abnormal finding of lung field: Secondary | ICD-10-CM | POA: Diagnosis not present

## 2021-07-26 LAB — GLUCOSE, CAPILLARY
Glucose-Capillary: 75 mg/dL (ref 70–99)
Glucose-Capillary: 193 mg/dL — ABNORMAL HIGH (ref 70–99)
Glucose-Capillary: 176 mg/dL — ABNORMAL HIGH (ref 70–99)
Glucose-Capillary: 194 mg/dL — ABNORMAL HIGH (ref 70–99)

## 2021-07-26 LAB — BASIC METABOLIC PANEL
Anion gap: 7 (ref 5–15)
BUN: 56 mg/dL — ABNORMAL HIGH (ref 8–23)
CO2: 27 mmol/L (ref 22–32)
Calcium: 8 mg/dL — ABNORMAL LOW (ref 8.9–10.3)
Chloride: 92 mmol/L — ABNORMAL LOW (ref 98–111)
Creatinine, Ser: 1.13 mg/dL (ref 0.61–1.24)
GFR, Estimated: >60 mL/min (ref >60)
Glucose, Bld: 193 mg/dL — ABNORMAL HIGH (ref 70–99)
Potassium: 4.6 mmol/L (ref 3.5–5.1)
Sodium: 126 mmol/L — ABNORMAL LOW (ref 135–145)

## 2021-07-26 MED ORDER — LORAZEPAM 2 MG/ML IJ SOLN: 0.5000 mg | Freq: Once | INTRAMUSCULAR | Status: DC | PRN

## 2021-07-26 MED ORDER — SODIUM CHLORIDE 1 G PO TABS
1.0000 g | ORAL_TABLET | Freq: Three times a day (TID) | ORAL | Status: DC
  Administered 2021-07-26 – 2021-07-27 (×5): 1 g via ORAL
  Filled 2021-07-26 (×13): qty 1

## 2021-07-26 MED ORDER — LORAZEPAM 2 MG/ML IJ SOLN
0.2500 mg | Freq: Once | INTRAMUSCULAR | Status: AC
  Administered 2021-07-26: 0.25 mg via INTRAVENOUS
  Filled 2021-07-26: qty 1

## 2021-07-26 NOTE — Telephone Encounter (Signed)
Called [name]' daughter regarding his medications. [name] is concerned that the Haldol, Ativan and sleeping medication is causing her father to be more agitated and confused. She has requested that these be reduced in order to see how her father responds and if there is an improvement in his situational awareness. The daughter and wife are concerned that [name] is unable to make an informed decision regarding his treatment options while being medicated, and has requested a visit from his oncology provider to discuss the treatment options while the patient is aware and able to understand what is being offered.  ? ?The hospitalist has requested the patient not be given any anti-anxiety or sleeping  medications beginning Wednesday afternoon, 4/12 and will reassess the patient Thursday 4/13. He has requested that the patient's radiation oncologist, [name], weigh in for what medication may be necessary to help the patient complete the brain MRI.  ? ?I will route this message to [name] and his PA, [name].  ? ? ?[name] R.T.(R)(T) ?Radiation Special Procedures Navigator  ? ? ?

## 2021-07-26 NOTE — TOC Progression Note (Addendum)
Transition of Care (TOC) - Progression Note  ? ? ?Patient Details  ?Name: [name] ?MRN: [id number] ?Date of Birth: [birth year] ? ?Transition of Care (TOC) CM/SW Contact  ?Inger, LCSW ?Phone Number: ?07/26/2021, 11:19 AM ? ?Clinical Narrative:    ? ?CSW called pt spouse to discuss disposition. She expresses she feels she will likely not be able to support pt at home. CSW explained SNF would be a short term stay and that family would need to arrange care after SNF stay. She consents to [facility] starting SNF workup. CSW explained that pt's behaviors and possible cancer treatments being a barrier to SNF. Spouse explains that after rehab, family will likely either plan for an ALF or private paying to have caregivers in the home. CSW will complete FL2 and fax bed requests in hub.  ? ?1230: CSW received voicemail from pt daughter requesting return call. CSW called back. Daughter explained she was confused about disposition. Stated that she was previously told he wouldn't be appropriate for rehab. CSW explained that PT recommendation currently recommending SNF for short term rehab. CSW explained potential barriers to SNF and clarified Short term rehab vs LTC. She is understanding of this. CSW inquired about what plans family has for pt after potential short term SNF stay. Daughter explained that the family is still waiting on MRI results to determine what he may need moving forward. She indicates that pt's cognitive function may be related to medications and if that is the case then it would influence long term plans for instance, return home vs memory care. She stated the family will come up with a long term plan but the plan will be dependent on MRI and further medical workup. She is agreeable to SNF workup but states she does not want pt going to [facility] in [place]. She expresses interest in [facility] and [facility] in [place]. Daughter inquired if MRI had been done; CSW explained it had  been attempted without success. Daughter seemed upset that he had been given Haldol and ativan for the MRI. She states she wants to talk to a doctor regarding meds though does not wish to discuss with RN or Hospitalist; states she wants to speak with Oncology. Attending notified.  ? ?Expected Discharge Plan: [place] ?Barriers to Discharge: Continued Medical Work up ? ?Expected Discharge Plan and Services ?Expected Discharge Plan: [place] ?  ?  ?  ?Living arrangements for the past 2 months: [place] ?                ?  ?  ?  ?  ?  ?  ?  ?  ?  ?  ? ? ?Social Determinants of Health (SDOH) Interventions ?  ? ?Readmission Risk Interventions ?   ? View : No data to display.  ?  ?  ?  ? ? ?

## 2021-07-26 NOTE — Progress Notes (Addendum)
Becoming more confused and agitated, threatened to hit undersigned while holding his arm  while standing to prevent him from falling.  Assisted to the chair with chair alarm on.  Discusssed with charge nurse  , provided with sitter at bedside. Haldol po 2mg  given. Continue to monitor. ?

## 2021-07-26 NOTE — Progress Notes (Signed)
Pt presented to MRI dept w/ meds but was unable to hold still. We called pt's RN, who was able to get more meds and came down and administered them. Pt was placed back into magnet but reduction to motion was negligible. Unable to complete exam. Pt was sent back to floor.  ?

## 2021-07-26 NOTE — Progress Notes (Signed)
?PROGRESS NOTE ?[name]  TIR:[id number] DOB: 05-12-1940 DOA: 08/06/2021 ?PCP: Janine Limbo, PA-C  ? ?Brief Narrative/Hospital Course: ?[name] is a 81 y.o. male with a history of abdominal wall abscess, bladder cancer, CAD, carotid artery stenosis, diverticulitis, diabetes, hyperlipidemia, hypertension, PVD, subclavian steal syndrome. Patient presented from an outside hospital secondary to evidence of pulmonary and brain masses concerning for metastatic cancer. Patient transferred to [facility] for bronchoscopy and biopsy. Decadron started for associated brain edema.  S/p bronch on 4/7.  Pathology shows squamous cell lung primary.  MRI brain ordered to determine type of radiation.  Also await SNF placement vs home- defer to TOC.  Stay complicated by hyponatremia  ?  ?Subjective: ?Seen and examined this morning.  Resting comfortably he is alert and oriented to self, current place current president ?Overnight afebrile blood pressure stable on room air ?Labs showed sodium further downtrending 128> 126 ?This morning unable to complete MRI brain due to his constant moving  despite Haldol and Ativan IV 0.25. ?He denies headache double vision numbness tingling focal weakness ? ?Assessment and Plan: ?Principal Problem: ?  Mass of upper lobe of left lung ?Active Problems: ?  CAD (coronary artery disease) ?  Hyperlipidemia ?  HTN (hypertension) ?  Type 2 diabetes mellitus ([place]) ?  Normocytic anemia ?  Hyponatremia ?  Metastasis to brain [facility]) ?  Protein-calorie malnutrition, severe ? ?Left upper lobe squamous cell carcinoma of lung  ?Brain metastasis: ?New diagnosis concern for primary lung cancer.Neurosurgery consulted at outside hospital ED with recommendations for no surgical treatment.  Status post bronchoscopy by pulmonary/7 pulmonology consulted and performed bronchoscopy on 4/7-biopsy shows malignant cells consistent with poorly differentiated squamous cell carcinoma.  Heme-onc and rad oncology  on consult.Patient and family opting for continued care in [place], rather than initiating care in [place]. Referral placed ?Started Decadron for brain metastasis with associated cerebral edema and mass effect.  MRI brain pending-ordered Ativan IV 0.5 mg for MRI prn. ?  ?CAD: No chest pain.  Home aspirin , will need to restart soon in the setting of PVD supplemention symptomatic carotid stenosis, cont on simvastatin and metoprolol ?  ?Hypertension: BP controlled on metoprolol BID ?  ?Hyperlipidemia- cont simvastatin and Lovaza ?  ?T2DM: HbA1C of 6.2%. on no medication therapy (prior on glipizide).  Now on Decadron was started glipizide, on SSI.  Monitor  ?Recent Labs  ?Lab 07/29/2021 ?0050 07/26/2021 ?0602 07/25/21 ?6761 07/25/21 ?1128 07/25/21 ?1626 07/25/21 ?2108 07/26/21 ?9509  ?GLUCAP  --    < > 159* 188* 126* 209* 75  ?HGBA1C 6.2*  --   --   --   --   --   --   ? < > = values in this interval not displayed.  ?   ?Anemia of chronic disease mildly stable  ? ?Delirium: In the setting of acute illness brain mass, possible sundowning, on Haldol as needed, may need Seroqueltrial ?  ?Tobacco abuse:does not plan to stop smoking ?  ?AKI: Holding ARB.  Creatinine improved. ?Recent Labs  ?Lab 07/18/2021 ?0903 07/23/21 ?3267 07/24/21 ?0120 07/25/21 ?0041 07/26/21 ?0101  ?BUN 28* 54* 57* 59* 56*  ?CREATININE 1.11 1.49* 1.44* 1.08 1.13  ?  ?Hyponatremia:?SIADH from brain mets.  Sodium further downtrending.urine Na 46 and osmo  744.Urine osmole 744 start fluid restriction and salt tablet ?Recent Labs  ?Lab 08/13/2021 ?0903 07/23/21 ?1245 07/24/21 ?0120 07/25/21 ?0041 07/26/21 ?0101  ?NA 131* 129* 129* 128* 126*  ?  ?Severe malnutrition continue  to augment nutrition status as tolerated ?Nutrition Status: ?Nutrition Problem: Severe Malnutrition ?Etiology: chronic illness (emphysema, lung mass, intracranial metastasis) ?Signs/Symptoms: severe fat depletion, severe muscle depletion ?Interventions: Ensure Enlive (each supplement  provides 350kcal and 20 grams of protein), MVI, Liberalize Diet, Education ?  ?DVT prophylaxis: SCDs Start: 07/29/2021 1201 ?Code Status:   Code Status: DNR ?Family Communication: plan of care discussed with patient at bedside. ?Patient status is: Inpatient level of care: Telemetry Medical  ?Remains inpatient because: Ongoing management of hyponatremia, further work-up with brain MRI ?Patient currently not stable ? ?Dispo: The patient is from: Home ?           Anticipated disposition: Skilled nursing facility advised  ? ?Mobility Assessment (last 72 hours)   ? ? Mobility Assessment   ? ? [place] Name 07/26/21 0813 07/25/21 1637 07/25/21 1300 07/25/21 0800 07/24/21 0800  ? Does patient have an order for bedrest or is patient medically unstable No - Continue assessment -- -- No - Continue assessment No - Continue assessment  ? What is the highest level of mobility based on the progressive mobility assessment? Level 4 (Walks with assist in room) - Balance while marching in place and cannot step forward and back - Complete Level 4 (Walks with assist in room) - Balance while marching in place and cannot step forward and back - Complete Level 4 (Walks with assist in room) - Balance while marching in place and cannot step forward and back - Complete Level 4 (Walks with assist in room) - Balance while marching in place and cannot step forward and back - Complete Level 4 (Walks with assist in room) - Balance while marching in place and cannot step forward and back - Complete  ? ?  ?  ? ?  ?  ? ?Objective: ?Vitals last 24 hrs: ?Vitals:  ? 07/26/21 0300 07/26/21 0813 07/26/21 0837 07/26/21 0926  ?BP: (!) 112/59   (!) 143/93  ?Pulse: (!) 57 (!) 58 (!) 102 62  ?Resp: 20 20 16    ?Temp: 98.1 ?F (36.7 ?C) 98.3 ?F (36.8 ?C)    ?TempSrc: Oral Oral    ?SpO2: 95% 95%    ?Weight:      ?Height:      ? ?Weight change:  ? ?Physical Examination: ?General exam: Aa0x2-3,older than stated age, weak appearing. ?HEENT:Oral mucosa moist, Ear/Nose WNL  grossly, dentition normal. ?Respiratory system: bilaterally clear BS, no use of accessory muscle ?Cardiovascular system: S1 & S2 +, No JVD,. ?Gastrointestinal system: Abdomen soft,NT,ND, BS+ ?Nervous System:Alert, awake, moving extremities and grossly nonfocal ?Extremities: LE edema none,distal peripheral pulses palpable.  ?Skin: No rashes,no icterus. ?MSK: Normal muscle bulk,tone, power ? ?Medications reviewed:  ?Scheduled Meds: ? busPIRone  15 mg Oral BID  ? dexamethasone  4 mg Oral Q6H  ? feeding supplement  237 mL Oral TID BM  ? fluticasone furoate-vilanterol  1 puff Inhalation Daily  ? glipiZIDE  2.5 mg Oral QAC breakfast  ? hydrALAZINE  50 mg Oral TID  ? insulin aspart  0-15 Units Subcutaneous TID WC  ? metoprolol tartrate  25 mg Oral BID  ? multivitamin with minerals  1 tablet Oral Daily  ? nicotine  7 mg Transdermal Daily  ? omega-3 acid ethyl esters  1 g Oral Daily  ? simvastatin  40 mg Oral QPM  ? sodium chloride  1 g Oral TID WC  ? tamsulosin  0.4 mg Oral Daily  ? ?Continuous Infusions: ? ?  ?Diet Order   ? ?       ?  Diet Carb Modified Fluid consistency: Thin; Room service appropriate? Yes; Fluid restriction: 1200 mL Fluid  Diet effective now       ?  ? ?  ?  ? ?  ?  ? ?Nutrition Problem: Severe Malnutrition ?Etiology: chronic illness (emphysema, lung mass, intracranial metastasis) ?Signs/Symptoms: severe fat depletion, severe muscle depletion ?Interventions: Ensure Enlive (each supplement provides 350kcal and 20 grams of protein), MVI, Liberalize Diet, Education ? ? ?Intake/Output Summary (Last 24 hours) at 07/26/2021 0935 ?Last data filed at 07/26/2021 0457 ?Gross per 24 hour  ?Intake 597 ml  ?Output 925 ml  ?Net -328 ml  ? ?Net IO Since Admission: 651.73 mL [07/26/21 0935]  ?Wt Readings from Last 3 Encounters:  ?08/08/2021 65.3 kg  ?08/09/20 68.3 kg  ?08/13/19 70 kg  ?  ? ?Unresulted Labs (From admission, onward)  ? ? None  ? ?  ?Data Reviewed: I have personally reviewed following labs and imaging  studies ?CBC: ?Recent Labs  ?Lab 08-Aug-2021 ?0903 07/22/21 ?4268 07/23/21 ?0059 07/24/21 ?0120 07/25/21 ?0041  ?WBC 10.8* 11.3* 12.2* 10.3 9.7  ?HGB 12.5* 11.4* 11.5* 11.6* 11.5*  ?HCT 36.5* 32.9* 34.0* 33.6* 33.7*

## 2021-07-26 NOTE — Progress Notes (Signed)
Kept getting out of bed,very unsteady on his feet. No amount of explanation would stop him from getting out of bed . Bed  and chair alarms . Continue to monitor. ?

## 2021-07-26 NOTE — [place] FL2 (Signed)
?[place] MEDICAID FL2 LEVEL OF CARE SCREENING TOOL  ?  ? ?IDENTIFICATION  ?Patient Name: ?[name] AGE Birthdate: 09/28/1940 Sex: male Admission Date (Current Location): ?08/09/2021  ?[place] and [place] Number: ? [place] ?  Facility and Address:  ?[facility], [place] [address] [place], [place] [address] ?     Provider Number: ?[id number]  ?Attending Physician Name and Address:  ?Antonieta Pert, MD ? Relative Name and Phone Number:  ?Chivas, Notz (Spouse)   [phone] [facility]) ?   ?Current Level of Care: ?Hospital Recommended Level of Care: ?[place] Prior Approval Number: ?  ? ?Date Approved/Denied: ?  PASRR Number: ?[id number] A ? ?Discharge Plan: ?SNF ?  ? ?Current Diagnoses: ?Patient Active Problem List  ? Diagnosis Date Noted  ? Protein-calorie malnutrition, severe 07/22/2021  ? Mass of upper lobe of left lung 08/01/2021  ? Normocytic anemia 07/25/2021  ? Hyponatremia 07/26/2021  ? Metastasis to brain [facility]) 07/17/2021  ? Carotid artery stenosis   ? PVD (peripheral vascular disease) ([place]) 08/12/2017  ? Hernia, inguinal, left 07/18/2015  ? Groin pain 07/18/2015  ? Claudication ([place]) 01/02/2015  ? Colovesical fistula - diverticular s/p lap colectomy/repair 01/30/13 12/24/2012  ? Colocutaneous fistula - diverticular - s/p lap colectomy/repair 01/30/13 12/24/2012  ? Diverticulitis - recurrent 12/08/2012  ? Type 2 diabetes mellitus ([place]) 11/10/2012  ? Smoking 11/25/2011  ? Pre-operative clearance 05/08/2011  ? Bradycardia 11/27/2010  ? CAD (coronary artery disease) 11/27/2010  ? Hyperlipidemia 11/27/2010  ? HTN (hypertension) 11/27/2010  ? Subclavian steal syndrome 11/27/2010  ? ? ?Orientation RESPIRATION BLADDER Height & Weight   ?  ?Self ? Normal Continent Weight: 144 lb (65.3 kg) ?Height:  6\' 2"  (188 cm)  ?BEHAVIORAL SYMPTOMS/MOOD NEUROLOGICAL BOWEL NUTRITION STATUS  ?Other (Comment) (Lung cancer with mets to the brain; Pt is confused; MRI pending)   Continent Diet (See  d/c summary)  ?AMBULATORY STATUS COMMUNICATION OF NEEDS Skin   ?Extensive Assist Verbally Other (Comment) (Skin tear, left, head) ?  ?  ?  ?    ?     ?     ? ? ?Personal Care Assistance Level of Assistance  ?Bathing, Feeding, Dressing Bathing Assistance: Limited assistance ?Feeding assistance: Independent ?Dressing Assistance: Limited assistance ?   ? ?Functional Limitations Info  ?Sight, Hearing, Speech Sight Info: Impaired ?Hearing Info: Impaired ?Speech Info: Adequate  ? ? ?SPECIAL CARE FACTORS FREQUENCY  ?PT (By licensed PT), OT (By licensed OT)   ?  ?PT Frequency: 5x/week ?OT Frequency: 5x/week ?  ?  ?  ?   ? ? ?Contractures Contractures Info: Not present  ? ? ?Additional Factors Info  ?Code Status, Allergies Code Status Info: DNR ?Allergies Info: Dilaudid (hydromorphone Hcl), wasp venom ?  ?  ?  ?   ? ?Current Medications (07/26/2021):  This is the current hospital active medication list ?Current Facility-Administered Medications  ?Medication Dose Route Frequency Provider Last Rate Last Admin  ? acetaminophen (TYLENOL) tablet 650 mg  650 mg Oral Q6H PRN Reubin Milan, MD   650 mg at 07/22/21 2232  ? Or  ? acetaminophen (TYLENOL) suppository 650 mg  650 mg Rectal Q6H PRN Reubin Milan, MD      ? albuterol (PROVENTIL) (2.5 MG/3ML) 0.083% nebulizer solution 2.5 mg  2.5 mg Nebulization Q2H PRN Reubin Milan, MD   2.5 mg at 07/25/21 2158  ? busPIRone (BUSPAR) tablet 15 mg  15 mg Oral BID Reubin Milan, MD   15 mg at  07/26/21 0927  ? dexamethasone (DECADRON) tablet 4 mg  4 mg Oral Q6H Reubin Milan, MD   4 mg at 07/26/21 0545  ? feeding supplement (ENSURE ENLIVE / ENSURE PLUS) liquid 237 mL  237 mL Oral TID BM Mariel Aloe, MD   237 mL at 07/26/21 0927  ? fluticasone furoate-vilanterol (BREO ELLIPTA) 200-25 MCG/ACT 1 puff  1 puff Inhalation Daily Mariel Aloe, MD   1 puff at 07/26/21 9675  ? glipiZIDE (GLUCOTROL) tablet 2.5 mg  2.5 mg Oral QAC breakfast Eulogio Bear U, DO   2.5 mg  at 07/26/21 0545  ? haloperidol (HALDOL) tablet 2 mg  2 mg Oral Q6H PRN Eulogio Bear U, DO   2 mg at 07/26/21 0545  ? hydrALAZINE (APRESOLINE) tablet 50 mg  50 mg Oral TID Reubin Milan, MD   50 mg at 07/26/21 9163  ? insulin aspart (novoLOG) injection 0-15 Units  0-15 Units Subcutaneous TID WC Reubin Milan, MD   2 Units at 07/25/21 1726  ? LORazepam (ATIVAN) injection 0.5 mg  0.5 mg Intravenous Once PRN Kc, Maren [place], MD      ? metoprolol tartrate (LOPRESSOR) tablet 25 mg  25 mg Oral BID Vann, Jessica U, DO   25 mg at 07/26/21 8466  ? multivitamin with minerals tablet 1 tablet  1 tablet Oral Daily Mariel Aloe, MD   1 tablet at 07/26/21 5993  ? nicotine (NICODERM CQ - dosed in mg/24 hr) patch 7 mg  7 mg Transdermal Daily Mariel Aloe, MD   7 mg at 07/26/21 5701  ? omega-3 acid ethyl esters (LOVAZA) capsule 1 g  1 g Oral Daily Reubin Milan, MD   1 g at 07/26/21 7793  ? ondansetron (ZOFRAN) tablet 4 mg  4 mg Oral Q6H PRN Reubin Milan, MD      ? Or  ? ondansetron [facility]) injection 4 mg  4 mg Intravenous Q6H PRN Reubin Milan, MD      ? simvastatin (ZOCOR) tablet 40 mg  40 mg Oral QPM Reubin Milan, MD   40 mg at 07/25/21 1724  ? sodium chloride tablet 1 g  1 g Oral TID WC Kc, Ramesh, MD   1 g at 07/26/21 0926  ? tamsulosin (FLOMAX) capsule 0.4 mg  0.4 mg Oral Daily Reubin Milan, MD   0.4 mg at 07/26/21 9030  ? traZODone (DESYREL) tablet 50 mg  50 mg Oral QHS PRN Reubin Milan, MD   50 mg at 07/24/21 2140  ? ? ? ?Discharge Medications: ?Please see discharge summary for a list of discharge medications. ? ?Relevant Imaging Results: ? ?Relevant Lab Results: ? ? ?Additional Information ?SSN [id number] ? ?Carroll, LCSW ? ? ? ? ?

## 2021-07-26 NOTE — Progress Notes (Signed)
Back from MRI by bed awake and alert. CBG_ 75. Breakfast served tolerated  100 %. ?

## 2021-07-27 ENCOUNTER — Inpatient Hospital Stay (HOSPITAL_COMMUNITY): Payer: Medicare Other

## 2021-07-27 ENCOUNTER — Ambulatory Visit: Payer: Medicare Other

## 2021-07-27 ENCOUNTER — Inpatient Hospital Stay: Payer: Medicare Other | Admitting: Hematology

## 2021-07-27 ENCOUNTER — Inpatient Hospital Stay: Admit: 2021-07-27 | Payer: Medicare Other | Admitting: Student

## 2021-07-27 ENCOUNTER — Ambulatory Visit
Admission: RE | Admit: 2021-07-27 | Discharge: 2021-07-27 | Disposition: A | Discharge status: Home / Self Care | Payer: Medicare Other | Source: Ambulatory Visit | Attending: Radiation Oncology | Admitting: Radiation Oncology

## 2021-07-27 DIAGNOSIS — R918 Other nonspecific abnormal finding of lung field: Secondary | ICD-10-CM | POA: Diagnosis not present

## 2021-07-27 DIAGNOSIS — C7931 Secondary malignant neoplasm of brain: Secondary | ICD-10-CM

## 2021-07-27 LAB — BASIC METABOLIC PANEL
Anion gap: 9 (ref 5–15)
BUN: 47 mg/dL — ABNORMAL HIGH (ref 8–23)
CO2: 27 mmol/L (ref 22–32)
Calcium: 8.6 mg/dL — ABNORMAL LOW (ref 8.9–10.3)
Chloride: 95 mmol/L — ABNORMAL LOW (ref 98–111)
Creatinine, Ser: 1.16 mg/dL (ref 0.61–1.24)
GFR, Estimated: >60 mL/min (ref >60)
Glucose, Bld: 148 mg/dL — ABNORMAL HIGH (ref 70–99)
Potassium: 4.5 mmol/L (ref 3.5–5.1)
Sodium: 131 mmol/L — ABNORMAL LOW (ref 135–145)

## 2021-07-27 LAB — TROPONIN I (HIGH SENSITIVITY)
Troponin I (High Sensitivity): 40 ng/L — ABNORMAL HIGH (ref <18)
Troponin I (High Sensitivity): 45 ng/L — ABNORMAL HIGH (ref <18)

## 2021-07-27 LAB — GLUCOSE, CAPILLARY
Glucose-Capillary: 154 mg/dL — ABNORMAL HIGH (ref 70–99)
Glucose-Capillary: 160 mg/dL — ABNORMAL HIGH (ref 70–99)
Glucose-Capillary: 93 mg/dL (ref 70–99)

## 2021-07-27 LAB — CBC
HCT: 38.4 % — ABNORMAL LOW (ref 39.0–52.0)
Hemoglobin: 13.8 g/dL (ref 13.0–17.0)
MCH: 33.3 pg (ref 26.0–34.0)
MCHC: 35.9 g/dL (ref 30.0–36.0)
MCV: 92.5 fL (ref 80.0–100.0)
Platelets: 257 10*3/uL (ref 150–400)
RBC: 4.15 MIL/uL — ABNORMAL LOW (ref 4.22–5.81)
RDW: 13.8 % (ref 11.5–15.5)
WBC: 11.9 10*3/uL — ABNORMAL HIGH (ref 4.0–10.5)
nRBC: 0 % (ref 0.0–0.2)

## 2021-07-27 MED ORDER — LORAZEPAM 2 MG/ML IJ SOLN
0.2500 mg | Freq: Once | INTRAMUSCULAR | Status: AC
  Administered 2021-07-27: 0.25 mg via INTRAVENOUS
  Filled 2021-07-27: qty 1

## 2021-07-27 MED ORDER — LORAZEPAM 1 MG PO TABS
1.0000 mg | ORAL_TABLET | Freq: Once | ORAL | Status: AC
  Administered 2021-07-27: 1 mg via ORAL
  Filled 2021-07-27: qty 1

## 2021-07-27 MED ORDER — MELATONIN 3 MG PO TABS
3.0000 mg | ORAL_TABLET | Freq: Every day | ORAL | Status: DC
  Administered 2021-07-27: 3 mg via ORAL
  Filled 2021-07-27: qty 1

## 2021-07-27 MED ORDER — LORAZEPAM 2 MG/ML IJ SOLN
0.2500 mg | Freq: Once | INTRAMUSCULAR | Status: AC
Start: 2021-07-27 — End: 2021-07-27
  Administered 2021-07-27: 0.25 mg via INTRAVENOUS
  Filled 2021-07-27: qty 1

## 2021-07-27 NOTE — Progress Notes (Signed)
?PROGRESS NOTE ?[name]  WUJ:[id number] DOB: 1940-12-21 DOA: 08/04/2021 ?PCP: Antonio Limbo, PA-C  ? ?Brief Narrative/Hospital Course: ?[name] is a 81 y.o. male with a history of abdominal wall abscess, bladder cancer, CAD, carotid artery stenosis, diverticulitis, diabetes, hyperlipidemia, hypertension, PVD, subclavian steal syndrome. Patient presented from an outside hospital secondary to evidence of pulmonary and brain masses concerning for metastatic cancer. Patient transferred to [facility] for bronchoscopy and biopsy. Decadron started for associated brain edema.  S/p bronch on 4/7.  Pathology shows squamous cell lung primary.  MRI brain ordered to determine type of radiation.  Also await SNF placement vs home- defer to TOC.  Stay complicated by hyponatremia  ?  ?Subjective: ?Seen and examined this morning.  Patient was alert awake oriented to self current place month year unable to tell me the current president-but answered with prompt ?Overnight afebrile ?Bp stable 1302-160s ?He is being transferred to [facility] for further treatment and radiation ? ?Assessment and Plan: ?Principal Problem: ?  Mass of upper lobe of left lung ?Active Problems: ?  CAD (coronary artery disease) ?  Hyperlipidemia ?  HTN (hypertension) ?  Type 2 diabetes mellitus ([place]) ?  Normocytic anemia ?  Hyponatremia ?  Metastasis to brain [facility]) ?  Protein-calorie malnutrition, severe ? ?Left upper lobe from poorly differentiated squamous cell carcinoma of lung  ?S/p bronchoscopy and biopsy by pulmonary 4/7  ?Brain metastasis: ?New diagnosis.Neurosurgery, rad onc and hem-onc were consulted.no surgical treatment and likely needs XRT SRS- awaiting dedicated MRI-but daughter is concerned about giving sedative, awaiting for radiation oncology to evaluate discuss with patient and family, Antonio. Tammi Dawson aware-informed by radiation oncology team.  he is being transferred to [facility] for further treatment and radiation-signed  out to [name]. ?Patient and family opting for continued care in [place], rather than initiating care in [place]. Referral placed.Started on Decadron for brain metastasis with associated cerebral edema and mass effect.  ?  ?CAD: No chest pain.  Holding aspirin , will need to restart soon in the setting of PVD, symptomatic carotid stenosis, cont on simvastatin and metoprolol ?  ?Hypertension: BP  stable on metoprolol BID ?  ?Hyperlipidemia: simvastatin and Lovaza ?  ?T2DM: HbA1C of 6.2%. on no medication therapy (prior on glipizide).  Now on Decadron was started glipizide, on SSI.  Monitor CBG. ?Recent Labs  ?Lab 07/28/2021 ?0050 07/17/2021 ?0602 07/26/21 ?9562 07/26/21 ?1138 07/26/21 ?1657 07/26/21 ?2129 07/27/21 ?1308  ?GLUCAP  --    < > 75 193* 176* 194* 154*  ?HGBA1C 6.2*  --   --   --   --   --   --   ? < > = values in this interval not displayed.  ?   ?Anemia of chronic disease mildly stable  ? ?Delirium: In the setting of acute illness brain mass, possible sundowning.  Due to family's concern discontinued trazodone and also not giving Haldol since yesterday.  Continue one-to-one supportive care fall precaution ?  ?Tobacco abuse:does not plan to stop smoking ?  ?AKI: Creatinine improved.  ARB on hold ?Recent Labs  ?Lab 07/28/2021 ?0903 07/23/21 ?6578 07/24/21 ?0120 07/25/21 ?0041 07/26/21 ?0101  ?BUN 28* 54* 57* 59* 56*  ?CREATININE 1.11 1.49* 1.44* 1.08 1.13  ? ?Hyponatremia:?SIADH from brain metsurine Na 46 and osmo  744.U- cont 1200 fluid restriction and 1 gm tid salt tablet, fu bmp pending this morning.  Correct sodium for hyperglycemia. ?Recent Labs  ?Lab 08/02/2021 ?0903 07/23/21 ?4696 07/24/21 ?0120 07/25/21 ?  0041 07/26/21 ?0101  ?NA 131* 129* 129* 128* 126*  ?  ?Severe malnutrition continue to augment nutrition status as tolerated ?Nutrition Status: ?Nutrition Problem: Severe Malnutrition ?Etiology: chronic illness (emphysema, lung mass, intracranial metastasis) ?Signs/Symptoms: severe fat depletion,  severe muscle depletion ?Interventions: Ensure Enlive (each supplement provides 350kcal and 20 grams of protein), MVI, Liberalize Diet, Education ?  ?DVT prophylaxis: SCDs Start: 08/13/2021 1201 ?Code Status:   Code Status: DNR ?Family Communication: plan of care discussed with patient at bedside. ?Patient status is: Inpatient level of care: Telemetry  ?Remains inpatient because: Ongoing management of hyponatremia, further work-up with brain MRI ?Patient currently not stable ? ?Dispo: The patient is from: Home.  Current activity level 4- walks with assist in room ?           Anticipated disposition: Skilled nursing facility.  Being transferred to [facility] for further radiation therapy. ? ?Mobility Assessment (last 72 hours)   ? ? Mobility Assessment   ? ? [place] Name 07/26/21 0813 07/25/21 1637 07/25/21 1300 07/25/21 0800  ?  ? Does patient have an order for bedrest or is patient medically unstable No - Continue assessment -- -- No - Continue assessment   ? What is the highest level of mobility based on the progressive mobility assessment? Level 4 (Walks with assist in room) - Balance while marching in place and cannot step forward and back - Complete Level 4 (Walks with assist in room) - Balance while marching in place and cannot step forward and back - Complete Level 4 (Walks with assist in room) - Balance while marching in place and cannot step forward and back - Complete Level 4 (Walks with assist in room) - Balance while marching in place and cannot step forward and back - Complete   ? ?  ?  ? ?  ?  ? ?Objective: ?Vitals last 24 hrs: ?Vitals:  ? 07/26/21 1911 07/26/21 2316 07/27/21 0307 07/27/21 0809  ?BP: 137/86 (!) 141/77 (!) 141/87 (!) 154/74  ?Pulse: 68 70 70 60  ?Resp: 14 19 (!) 21 19  ?Temp: 98.1 ?F (36.7 ?C) (!) 97.3 ?F (36.3 ?C) 97.6 ?F (36.4 ?C) 98.2 ?F (36.8 ?C)  ?TempSrc: Oral Oral Oral Oral  ?SpO2: 98% 94% 96% 92%  ?Weight:      ?Height:      ? ?Weight change:  ? ?Physical Examination: ?General exam:  AAOX2-3, elderly gentleman, ill looking frail.   ?HEENT:Oral mucosa moist, Ear/Nose WNL grossly, dentition normal. ?Respiratory system: bilaterally clear,no use of accessory muscle ?Cardiovascular system: S1 & S2 +, No JVD,. ?Gastrointestinal system: Abdomen soft,NT,ND, BS+ ?Nervous System:Alert, awake, moving extremities and grossly nonfocal ?Extremities: edema neg,distal peripheral pulses palpable.  ?Skin: No rashes,no icterus. ?MSK: Normal muscle bulk,tone, power ? ?Medications reviewed:  ?Scheduled Meds: ? busPIRone  15 mg Oral BID  ? dexamethasone  4 mg Oral Q6H  ? feeding supplement  237 mL Oral TID BM  ? fluticasone furoate-vilanterol  1 puff Inhalation Daily  ? glipiZIDE  2.5 mg Oral QAC breakfast  ? hydrALAZINE  50 mg Oral TID  ? insulin aspart  0-15 Units Subcutaneous TID WC  ? metoprolol tartrate  25 mg Oral BID  ? multivitamin with minerals  1 tablet Oral Daily  ? nicotine  7 mg Transdermal Daily  ? omega-3 acid ethyl esters  1 g Oral Daily  ? simvastatin  40 mg Oral QPM  ? sodium chloride  1 g Oral TID WC  ? tamsulosin  0.4 mg  Oral Daily  ? ?Continuous Infusions: ? ?  ?Diet Order   ? ?       ?  Diet Carb Modified Fluid consistency: Thin; Room service appropriate? Yes; Fluid restriction: 1200 mL Fluid  Diet effective now       ?  ? ?  ?  ? ?  ?  ? ?Nutrition Problem: Severe Malnutrition ?Etiology: chronic illness (emphysema, lung mass, intracranial metastasis) ?Signs/Symptoms: severe fat depletion, severe muscle depletion ?Interventions: Ensure Enlive (each supplement provides 350kcal and 20 grams of protein), MVI, Liberalize Diet, Education ? ? ?Intake/Output Summary (Last 24 hours) at 07/27/2021 1100 ?Last data filed at 07/27/2021 0100 ?Gross per 24 hour  ?Intake 600 ml  ?Output 700 ml  ?Net -100 ml  ? ?Net IO Since Admission: 351.73 mL [07/27/21 1100]  ?Wt Readings from Last 3 Encounters:  ?08/06/2021 65.3 kg  ?08/09/20 68.3 kg  ?08/13/19 70 kg  ?  ? ?Unresulted Labs (From admission, onward)  ? ?  Start      Ordered  ? 07/28/21 0500  CBC  Daily,   R     ?Question:  Specimen collection method  Answer:  Lab=Lab collect  ? 07/27/21 0626  ? 07/28/21 0500  Comprehensive metabolic panel  Daily,   R     ?Question:

## 2021-07-27 NOTE — Progress Notes (Signed)
[name]   DOB:1940-10-30   GM#:[id number]   UYQ#:[id number] ? ?Oncology follow up  ? ?Subjective: I was called by Dr. Tammi Klippel to see this pt today to discuss treatment options and GOC, because pt's wife and daughter are not sure if he should proceed with brain radiation or not.  Patient was initially seen by my partner Dr. Lorenso Courier last week, I saw him today in the radiation oncology department.  Patient was slightly confused when I saw him, did not participate the conversation much.  I spoke with his wife and the daughter at bedside. Per them, pt has been very anxious and agitated some time, his appetite has improved with steroids in the hospital.  ? ? ?Objective:  ?Vitals:  ? 07/27/21 1728 07/27/21 1926  ?BP: 138/80 (!) 139/94  ?Pulse: 79 95  ?Resp:  16  ?Temp:  98.1 ?F (36.7 ?C)  ?SpO2: 94% 95%  ?  Body mass index is 18.49 kg/m?. ? ?Intake/Output Summary (Last 24 hours) at 07/27/2021 2251 ?Last data filed at 07/27/2021 1100 ?Gross per 24 hour  ?Intake 240 ml  ?Output 750 ml  ?Net -510 ml  ? ? ? Sclerae unicteric ? No peripheral adenopathy ? Lungs clear -- no rales or rhonchi ? Heart regular rate and rhythm ? Abdomen benign ? MSK no focal spinal tenderness, no peripheral edema ? Neuro nonfocal ?  ? ?CBG (last 3)  ?Recent Labs  ?  07/27/21 ?0613 07/27/21 ?1705 07/27/21 ?2139  ?GLUCAP 154* 160* 93  ? ? ? ?Labs:  ?Urine Studies ?No results for input(s): UHGB, CRYS in the last 72 hours. ? ?Invalid input(s): UACOL, UAPR, USPG, UPH, UTP, UGL, UKET, UBIL, UNIT, UROB, ULEU, UEPI, UWBC, URBC, UBAC, CAST, UCOM, BILUA ? ?Basic Metabolic Panel: ?Recent Labs  ?Lab 07/23/21 ?6387 07/24/21 ?0120 07/25/21 ?0041 07/26/21 ?0101 07/27/21 ?5643  ?NA 129* 129* 128* 126* 131*  ?K 4.2 4.8 4.9 4.6 4.5  ?CL 96* 96* 98 92* 95*  ?CO2 25 26 24 27 27   ?GLUCOSE 291* 178* 178* 193* 148*  ?BUN 54* 57* 59* 56* 47*  ?CREATININE 1.49* 1.44* 1.08 1.13 1.16  ?CALCIUM 8.3* 8.2* 7.9* 8.0* 8.6*  ? ?GFR ?Estimated Creatinine Clearance: 46.9 mL/min (by  C-G formula based on SCr of 1.16 mg/dL). ?Liver Function Tests: ?No results for input(s): AST, ALT, ALKPHOS, BILITOT, PROT, ALBUMIN in the last 168 hours. ?No results for input(s): LIPASE, AMYLASE in the last 168 hours. ?No results for input(s): AMMONIA in the last 168 hours. ?Coagulation profile ?No results for input(s): INR, PROTIME in the last 168 hours. ? ?CBC: ?Recent Labs  ?Lab 07/22/21 ?3295 07/23/21 ?0059 07/24/21 ?0120 07/25/21 ?0041 07/27/21 ?1884  ?WBC 11.3* 12.2* 10.3 9.7 11.9*  ?HGB 11.4* 11.5* 11.6* 11.5* 13.8  ?HCT 32.9* 34.0* 33.6* 33.7* 38.4*  ?MCV 95.1 96.3 95.2 95.5 92.5  ?PLT 262 249 233 233 257  ? ?Cardiac Enzymes: ?No results for input(s): CKTOTAL, CKMB, CKMBINDEX, TROPONINI in the last 168 hours. ?BNP: ?Invalid input(s): POCBNP ?CBG: ?Recent Labs  ?Lab 07/26/21 ?1657 07/26/21 ?2129 07/27/21 ?1660 07/27/21 ?1705 07/27/21 ?2139  ?GLUCAP 176* 194* 154* 160* 93  ? ?D-Dimer ?No results for input(s): DDIMER in the last 72 hours. ?Hgb A1c ?No results for input(s): HGBA1C in the last 72 hours. ?Lipid Profile ?No results for input(s): CHOL, HDL, LDLCALC, TRIG, CHOLHDL, LDLDIRECT in the last 72 hours. ?Thyroid function studies ?No results for input(s): TSH, T4TOTAL, T3FREE, THYROIDAB in the last 72 hours. ? ?Invalid input(s): FREET3 ?Anemia work  up ?No results for input(s): VITAMINB12, FOLATE, FERRITIN, TIBC, IRON, RETICCTPCT in the last 72 hours. ?Microbiology ?Recent Results (from the past 240 hour(s))  ?MRSA Next Gen by PCR, Nasal     Status: None  ? Collection Time: 08/02/2021  8:33 PM  ? Specimen: Nasal Mucosa; Nasal Swab  ?Result Value Ref Range Status  ? MRSA by PCR Next Gen NOT DETECTED NOT DETECTED Final  ?  Comment: (NOTE) ?The GeneXpert MRSA Assay (FDA approved for NASAL specimens only), ?is one component of a comprehensive MRSA colonization surveillance ?program. It is not intended to diagnose MRSA infection nor to guide ?or monitor treatment for MRSA infections. ?Test performance is not FDA  approved in patients less than 2 years ?old. ?Performed at [facility], [place] [address], [place], [place] ?[address] ?  ?SARS Coronavirus 2 by RT PCR (hospital order, performed in [facility] hospital lab) Nasopharyngeal Nasopharyngeal Swab     Status: None  ? Collection Time: 07/26/2021  8:11 AM  ? Specimen: Nasopharyngeal Swab  ?Result Value Ref Range Status  ? SARS Coronavirus 2 NEGATIVE NEGATIVE Final  ?  Comment: (NOTE) ?SARS-CoV-2 target nucleic acids are NOT DETECTED. ? ?The SARS-CoV-2 RNA is generally detectable in upper and lower ?respiratory specimens during the acute phase of infection. The lowest ?concentration of SARS-CoV-2 viral copies this assay can detect is 250 ?copies / mL. A negative result does not preclude SARS-CoV-2 infection ?and should not be used as the sole basis for treatment or other ?patient management decisions.  A negative result may occur with ?improper specimen collection / handling, submission of specimen other ?than nasopharyngeal swab, presence of viral mutation(s) within the ?areas targeted by this assay, and inadequate number of viral copies ?(<250 copies / mL). A negative result must be combined with clinical ?observations, patient history, and epidemiological information. ? ?Fact Sheet for Patients:   ?StrictlyIdeas.no ? ?Fact Sheet for Healthcare Providers: ?BankingDealers.co.za ? ?This test is not yet approved or  cleared by the [place] FDA and ?has been authorized for detection and/or diagnosis of SARS-CoV-2 by ?FDA under an Emergency Use Authorization (EUA).  This EUA will remain ?in effect (meaning this test can be used) for the duration of the ?COVID-19 declaration under Section 564(b)(1) of the Act, 21 U.S.C. ?section 360bbb-3(b)(1), unless the authorization is terminated or ?revoked sooner. ? ?Performed at [facility], [place] [address], [place], [place] ?[address] ?  ? ? ? ? ?Studies:  ?DG Chest Port 1  View ? ?Result Date: 07/27/2021 ?CLINICAL DATA:  Chest pain. EXAM: PORTABLE CHEST 1 VIEW COMPARISON:  Chest x-ray 08/03/2021.  CT of the chest 07/31/2021. FINDINGS: Left upper lobe mass has not significantly changed. No new focal lung infiltrate. Cardiomediastinal silhouette within normal limits. No acute fractures. IMPRESSION: 1. Left upper lobe mass has not significantly changed. 2. No other focal lung infiltrate. Electronically Signed   By: Ronney Asters M.D.   On: 07/27/2021 18:36   ? ?Assessment: 81 y.o. male with significant smoking history, bladder cancer, coronary artery disease, PVD, hypertension, presented with paresthesia, gait instability and fall, with progressive weakness and anorexia. ? ?Metastatic LUL squamous cell carcinoma of lung to brain, stage IV,newly diagnosed ?HTN, CAD, PVD ?Delirium ?Moderate protein and calorie malnutrition ?Hyponatremia ?Deconditioning ? ?Plan:  ?-Patient has not been very cooperative with brain MRI, not feasible for SBRT, Dr. Tammi Klippel has discussed whole brain radiation with patient and his family.  ?-The biopsy showed poorly differentiated squamous cell carcinoma, with the  diffuse brain metastasis, this is aggressive SCC, his prognosis is poor, I think his life expectancy is likely several weeks to a few months ?-His wife and daughter understand the goal of care is palliative.  Due to his advanced age and medical comorbidities, he is not a candidate for chemotherapy.  Given his neurological symptoms and confusion, overall very poor performance status, I recommend hospice.  I discussed the hospice service in detail, including home hospice, hospice with SNF replacement, or residential hospice.  ?-Patient's wife told me that patient's mother had metastatic lung cancer.  Patient has told his wife that he does not want to prolong his life if he has incurable cancer. ?-Pt lives alone, has limited social support. His wife does not live with him, only daughter works time.  ?-pt  would benefit form palliative care consult, Dr. Tammi Klippel has placed the referral  ?-Dr. Lorenso Courier will follow up next week if needed.  ? ?I spent a total of 40 minutes for the visit today. ? ?Truitt Merle, MD ?4/13/202

## 2021-07-27 NOTE — Progress Notes (Signed)
Pt came to MRI w/ meds more agitated than yesterday. Took time to try to calm pt and he angrily said let's get it done. After one sequence pt started yelling to get him out but I was able to talk him into trying again. Once again as sequences were running pt continued to yell and when pt started violently throwing his legs way up in the air in an attempt to extricate himself from the magnet I had to stop due to concerns for pt safety. Pt  refused to continue and was sent back to floor. Second attempt with worse results.  ?

## 2021-07-27 NOTE — Progress Notes (Signed)
Nutrition Follow-up ? ?DOCUMENTATION CODES:  ? ?Underweight, Severe malnutrition in context of chronic illness ? ?INTERVENTION:  ? ?- Continue Ensure Enlive po TID, each supplement provides 350 kcal and 20 grams of protein ?  ?- Continue MVI with minerals daily ? ?NUTRITION DIAGNOSIS:  ? ?Severe Malnutrition related to chronic illness (emphysema, lung mass, intracranial metastasis) as evidenced by severe fat depletion, severe muscle depletion. ? ?Ongoing, being addressed via oral nutrition supplements ? ?GOAL:  ? ?Patient will meet greater than or equal to 90% of their needs ? ?Progressing ? ?MONITOR:  ? ?PO intake, Supplement acceptance, Labs, Weight trends ? ?REASON FOR ASSESSMENT:  ? ?Consult ?Assessment of nutrition requirement/status ? ?ASSESSMENT:  ? ?81 year old male who presented for evaluation of neurological symptoms after a fall who was found to have intracranial metastasis and a lung mass. PMH of centrilobular emphysema, HLD, HTN, CAD, T2DM, PVD, diverticulitis s/p sigmoid colectomy, bladder cancer treated with BCG. ? ?04/07 - s/p bronchoscopy with cytology showing stage IV squamous cell lung cancer ? ?RD was unable to meet with pt prior to transfer to [facility]. Per meal completions, pt's PO intake has greatly improved. Pt accepting most Ensure oral nutrition supplements. Will continue with current regimen at this time. ? ?Meal Completion: 80-100% ? ?Medications reviewed and include: decadron, Ensure Enlive TID, glipizide, SSI, MVI with minerals, lovaza, sodium chloride 1 gram TID ? ?Labs reviewed: sodium 126, BUN 56, WBC 11.9, hemoglobin A1C 6.2 ?CBG's: 154-194 x 24 hours ? ?UOP: 700 ml x 24 hours ? ?Diet Order:   ?Diet Order   ? ?       ?  Diet Carb Modified Fluid consistency: Thin; Room service appropriate? Yes; Fluid restriction: 1200 mL Fluid  Diet effective now       ?  ? ?  ?  ? ?  ? ? ?EDUCATION NEEDS:  ? ?Education needs have been addressed ? ?Skin:  Skin Assessment: Skin Integrity Issues: (skin tear  head) ? ?Last BM:  07/24/21 ? ?Height:  ? ?Ht Readings from Last 1 Encounters:  ?07/19/2021 6\' 2"  (1.88 m)  ? ? ?Weight:  ? ?Wt Readings from Last 1 Encounters:  ?08/13/2021 65.3 kg  ? ? ?BMI:  Body mass index is 18.49 kg/m?. ? ?Estimated Nutritional Needs:  ? ?Kcal:  2000-2200 ? ?Protein:  100-115 grams ? ?Fluid:  >/= 2.0 L ? ? ? ?Gustavus Bryant, MS, RD, LDN ?Inpatient Clinical Dietitian ?Please see AMiON for contact information. ? ?

## 2021-07-28 ENCOUNTER — Ambulatory Visit: Payer: Medicare Other

## 2021-07-28 DIAGNOSIS — R410 Disorientation, unspecified: Secondary | ICD-10-CM | POA: Diagnosis not present

## 2021-07-28 DIAGNOSIS — R918 Other nonspecific abnormal finding of lung field: Secondary | ICD-10-CM | POA: Diagnosis not present

## 2021-07-28 DIAGNOSIS — Z7189 Other specified counseling: Secondary | ICD-10-CM | POA: Diagnosis not present

## 2021-07-28 DIAGNOSIS — C7931 Secondary malignant neoplasm of brain: Secondary | ICD-10-CM | POA: Diagnosis not present

## 2021-07-28 LAB — COMPREHENSIVE METABOLIC PANEL
ALT: 23 U/L (ref 0–44)
AST: 27 U/L (ref 15–41)
Albumin: 3 g/dL — ABNORMAL LOW (ref 3.5–5.0)
Alkaline Phosphatase: 44 U/L (ref 38–126)
Anion gap: 7 (ref 5–15)
BUN: 44 mg/dL — ABNORMAL HIGH (ref 8–23)
CO2: 30 mmol/L (ref 22–32)
Calcium: 8.2 mg/dL — ABNORMAL LOW (ref 8.9–10.3)
Chloride: 94 mmol/L — ABNORMAL LOW (ref 98–111)
Creatinine, Ser: 0.89 mg/dL (ref 0.61–1.24)
GFR, Estimated: >60 mL/min (ref >60)
Glucose, Bld: 85 mg/dL (ref 70–99)
Potassium: 3.9 mmol/L (ref 3.5–5.1)
Sodium: 131 mmol/L — ABNORMAL LOW (ref 135–145)
Total Bilirubin: 0.7 mg/dL (ref 0.3–1.2)
Total Protein: 5.7 g/dL — ABNORMAL LOW (ref 6.5–8.1)

## 2021-07-28 LAB — GLUCOSE, CAPILLARY
Glucose-Capillary: 127 mg/dL — ABNORMAL HIGH (ref 70–99)
Glucose-Capillary: 79 mg/dL (ref 70–99)
Glucose-Capillary: 103 mg/dL — ABNORMAL HIGH (ref 70–99)
Glucose-Capillary: 244 mg/dL — ABNORMAL HIGH (ref 70–99)

## 2021-07-28 LAB — CBC
HCT: 36.9 % — ABNORMAL LOW (ref 39.0–52.0)
Hemoglobin: 13.2 g/dL (ref 13.0–17.0)
MCH: 33.5 pg (ref 26.0–34.0)
MCHC: 35.8 g/dL (ref 30.0–36.0)
MCV: 93.7 fL (ref 80.0–100.0)
Platelets: 237 10*3/uL (ref 150–400)
RBC: 3.94 MIL/uL — ABNORMAL LOW (ref 4.22–5.81)
RDW: 14 % (ref 11.5–15.5)
WBC: 13.3 10*3/uL — ABNORMAL HIGH (ref 4.0–10.5)
nRBC: 0 % (ref 0.0–0.2)

## 2021-07-28 MED ORDER — QUETIAPINE FUMARATE 50 MG PO TABS
50.0000 mg | ORAL_TABLET | Freq: Every day | ORAL | Status: DC
  Administered 2021-07-28: 50 mg via ORAL
  Filled 2021-07-28: qty 1

## 2021-07-28 MED ORDER — LORAZEPAM 2 MG/ML IJ SOLN
1.0000 mg | Freq: Once | INTRAMUSCULAR | Status: AC
  Administered 2021-07-28: 1 mg via INTRAVENOUS
  Filled 2021-07-28: qty 1

## 2021-07-28 MED ORDER — MORPHINE SULFATE (PF) 2 MG/ML IV SOLN
2.0000 mg | INTRAVENOUS | Status: DC | PRN
  Administered 2021-07-28 – 2021-07-30 (×12): 2 mg via INTRAVENOUS
  Filled 2021-07-28 (×12): qty 1

## 2021-07-28 MED ORDER — LORAZEPAM 2 MG/ML IJ SOLN
2.0000 mg | INTRAMUSCULAR | Status: DC | PRN
  Administered 2021-07-28 – 2021-07-29 (×4): 2 mg via INTRAVENOUS
  Filled 2021-07-28 (×4): qty 1

## 2021-07-28 MED ORDER — HOME MED STORE IN PYXIS: 2.0000 | Freq: Two times a day (BID) | Status: DC

## 2021-07-28 MED ORDER — OLANZAPINE 10 MG IM SOLR
5.0000 mg | Freq: Once | INTRAMUSCULAR | Status: AC
  Administered 2021-07-28: 5 mg via INTRAMUSCULAR
  Filled 2021-07-28: qty 10

## 2021-07-28 MED ORDER — SODIUM CHLORIDE 0.9 % IV SOLN: 250.0000 mL | INTRAVENOUS | Status: DC | PRN

## 2021-07-28 MED ORDER — SODIUM CHLORIDE 0.9% FLUSH: 3.0000 mL | INTRAVENOUS | Status: DC | PRN

## 2021-07-28 MED ORDER — BUDESONIDE-FORMOTEROL FUMARATE 160-4.5 MCG/ACT IN AERO
2.0000 | INHALATION_SPRAY | Freq: Two times a day (BID) | RESPIRATORY_TRACT | Status: DC
  Administered 2021-07-28 – 2021-07-29 (×2): 2 via RESPIRATORY_TRACT

## 2021-07-28 MED ORDER — MORPHINE SULFATE (PF) 2 MG/ML IV SOLN: 1.0000 mg | INTRAVENOUS | Status: DC | PRN

## 2021-07-28 MED ORDER — QUETIAPINE FUMARATE 50 MG PO TABS: 50.0000 mg | ORAL_TABLET | Freq: Every day | ORAL | Status: DC

## 2021-07-28 MED ORDER — OLANZAPINE 10 MG IM SOLR
5.0000 mg | Freq: Three times a day (TID) | INTRAMUSCULAR | Status: DC | PRN
  Administered 2021-07-28: 5 mg via INTRAMUSCULAR
  Filled 2021-07-28 (×3): qty 10

## 2021-07-28 MED ORDER — SODIUM CHLORIDE 0.9% FLUSH
3.0000 mL | Freq: Two times a day (BID) | INTRAVENOUS | Status: DC
  Administered 2021-07-28 – 2021-07-29 (×3): 3 mL via INTRAVENOUS

## 2021-07-28 NOTE — Progress Notes (Signed)
?  Radiation Oncology         [phone] ?________________________________ ? ?Name: ARVELL PULSIFER MRN: [id number]  ?Date: 07/19/2021  DOB: 23-Aug-1940 ? ?Chart Note: ? ?I met with the patient and his wife and daughter yesterday.  We reviewed options ranging from stereotactic radiosurgery, whole brain radiation or comfort care.  The patient would like to meet with palliative care to discuss comfort care with likely hospice enrollment.  No radiation is planned at this time. ? ?________________________________ ? ?Sheral Apley Tammi Klippel, M.D. ? ? ? ?

## 2021-07-28 NOTE — Progress Notes (Signed)
?PROGRESS NOTE ? ? ? [name]  MEQ:[id number] DOB: 11/10/1940 DOA: 08/04/2021 ?PCP: Janine Limbo, PA-C  ? ?Brief Narrative:  ?81 y.o. male with a history of abdominal wall abscess, bladder cancer, CAD, carotid artery stenosis, diverticulitis, diabetes, hyperlipidemia, hypertension, PVD, subclavian steal syndrome. Patient presented from an outside hospital secondary to evidence of pulmonary and brain masses concerning for metastatic cancer. Patient transferred to [facility] for bronchoscopy and biopsy. Decadron started for associated brain edema.  S/p bronch on 4/7.  Pathology shows squamous cell lung primary.  MRI brain ordered to determine type of radiation.  Also await SNF placement vs home- defer to TOC.  Stay complicated by hyponatremia  ? ? ?Assessment & Plan: ? Principal Problem: ?  Mass of upper lobe of left lung ?Active Problems: ?  CAD (coronary artery disease) ?  Hyperlipidemia ?  HTN (hypertension) ?  Type 2 diabetes mellitus ([place]) ?  Normocytic anemia ?  Hyponatremia ?  Metastasis to brain [facility]) ?  Protein-calorie malnutrition, severe ?  ? ?Left upper lobe from poorly differentiated squamous cell carcinoma of lung  ?S/p bronchoscopy and biopsy by pulmonary 4/7  ?Brain metastasis ?New diagnosis.Neurosurgery, rad onc and hem-onc were consulted.no surgical treatment and likely needs XRT SRS- awaiting dedicated MRI.  But after discussing case with radiation oncology and family they would like to explore comfort care hospice option.  Palliative care team consulted. ?  ?CAD: No chest pain.  ?  ?Hypertension: BP  stable on metoprolol BID ?  ?Hyperlipidemia: simvastatin and Lovaza ?  ?T2DM: HbA1C of 6.2%. on no medication therapy (prior on glipizide).  Now on Decadron was started glipizide, on SSI.  Monitor CBG. ?   ?Anemia of chronic disease mildly stable  ?  ?Delirium: In the setting of acute illness brain mass, possible sundowning. Zyprexa has to be ordered this morning due to patient and staff  sfety.  ?  ?Tobacco abuse:does not plan to stop smoking ?  ?AKI: Creatinine improved.  ARB on hold ?  ?Hyponatremia:poor oral intake ?  ?Severe malnutrition continue to augment nutrition status as tolerated ?Nutrition Status: ?Nutrition Problem: Severe Malnutrition ?Etiology: chronic illness (emphysema, lung mass, intracranial metastasis) ?Signs/Symptoms: severe fat depletion, severe muscle depletion ?Interventions: Ensure Enlive (each supplement provides 350kcal and 20 grams of protein), MVI, Liberalize Diet, Education ? ? ?DVT prophylaxis: SCDs Start: 07/19/2021 1201 ?Code Status: DNR ?Family Communication:  Spouse and daughter updated.  ? ?Status is: Inpatient ?Remains inpatient appropriate because: Palliative consult.  ? ? ?Nutritional status ? ? ? ?Signs/Symptoms: severe fat depletion, severe muscle depletion ? ?Interventions: Ensure Enlive (each supplement provides 350kcal and 20 grams of protein), MVI, Liberalize Diet, Education ? ?Body mass index is 18.49 kg/m?. ? ?  ? ?Subjective: ?Very agitated this morning, get out of the bed this morning, hard to himself and other.  ? ?Examination: ? ?Constitutional: agitated ?Respiratory: Clear to auscultation bilaterally ?Cardiovascular: Normal sinus rhythm, no rubs ?Abdomen: Nontender nondistended good bowel sounds ?Musculoskeletal: No edema noted ?Skin: No rashes seen ?Neurologic: grossly moving all extremities.  ?Psychiatric: agitated  ? ?Objective: ?Vitals:  ? 07/27/21 1728 07/27/21 1926 07/27/21 2317 07/28/21 0356  ?BP: 138/80 (!) 139/94 (!) 160/83 (!) 188/69  ?Pulse: 79 95 64 76  ?Resp:  16 14 16   ?Temp:  98.1 ?F (36.7 ?C) (!) 97.5 ?F (36.4 ?C) (!) 97.5 ?F (36.4 ?C)  ?TempSrc:  Oral Oral Oral  ?SpO2: 94% 95% 96% 96%  ?Weight:      ?Height:      ? ? ?  Intake/Output Summary (Last 24 hours) at 07/28/2021 1607 ?Last data filed at 07/27/2021 1100 ?Gross per 24 hour  ?Intake --  ?Output 400 ml  ?Net -400 ml  ? ?Filed Weights  ? 08/08/2021 2030 08/01/2021 0804  ?Weight: 62.1 kg  65.3 kg  ? ? ? ?Data Reviewed:  ? ?CBC: ?Recent Labs  ?Lab 07/23/21 ?3710 07/24/21 ?0120 07/25/21 ?0041 07/27/21 ?6269 07/28/21 ?0600  ?WBC 12.2* 10.3 9.7 11.9* 13.3*  ?HGB 11.5* 11.6* 11.5* 13.8 13.2  ?HCT 34.0* 33.6* 33.7* 38.4* 36.9*  ?MCV 96.3 95.2 95.5 92.5 93.7  ?PLT 249 233 233 257 237  ? ?Basic Metabolic Panel: ?Recent Labs  ?Lab 07/24/21 ?0120 07/25/21 ?0041 07/26/21 ?0101 07/27/21 ?4854 07/28/21 ?0600  ?NA 129* 128* 126* 131* 131*  ?K 4.8 4.9 4.6 4.5 3.9  ?CL 96* 98 92* 95* 94*  ?CO2 26 24 27 27 30   ?GLUCOSE 178* 178* 193* 148* 85  ?BUN 57* 59* 56* 47* 44*  ?CREATININE 1.44* 1.08 1.13 1.16 0.89  ?CALCIUM 8.2* 7.9* 8.0* 8.6* 8.2*  ? ?GFR: ?Estimated Creatinine Clearance: 61.1 mL/min (by C-G formula based on SCr of 0.89 mg/dL). ?Liver Function Tests: ?Recent Labs  ?Lab 07/28/21 ?0600  ?AST 27  ?ALT 23  ?ALKPHOS 44  ?BILITOT 0.7  ?PROT 5.7*  ?ALBUMIN 3.0*  ? ?No results for input(s): LIPASE, AMYLASE in the last 168 hours. ?No results for input(s): AMMONIA in the last 168 hours. ?Coagulation Profile: ?No results for input(s): INR, PROTIME in the last 168 hours. ?Cardiac Enzymes: ?No results for input(s): CKTOTAL, CKMB, CKMBINDEX, TROPONINI in the last 168 hours. ?BNP (last 3 results) ?No results for input(s): PROBNP in the last 8760 hours. ?HbA1C: ?No results for input(s): HGBA1C in the last 72 hours. ?CBG: ?Recent Labs  ?Lab 07/27/21 ?6270 07/27/21 ?1705 07/27/21 ?2139 07/28/21 ?3500 07/28/21 ?9381  ?GLUCAP 154* 160* 93 127* 79  ? ?Lipid Profile: ?No results for input(s): CHOL, HDL, LDLCALC, TRIG, CHOLHDL, LDLDIRECT in the last 72 hours. ?Thyroid Function Tests: ?No results for input(s): TSH, T4TOTAL, FREET4, T3FREE, THYROIDAB in the last 72 hours. ?Anemia Panel: ?No results for input(s): VITAMINB12, FOLATE, FERRITIN, TIBC, IRON, RETICCTPCT in the last 72 hours. ?Sepsis Labs: ?No results for input(s): PROCALCITON, LATICACIDVEN in the last 168 hours. ? ?Recent Results (from the past 240 hour(s))  ?MRSA Next  Gen by PCR, Nasal     Status: None  ? Collection Time: 08/13/2021  8:33 PM  ? Specimen: Nasal Mucosa; Nasal Swab  ?Result Value Ref Range Status  ? MRSA by PCR Next Gen NOT DETECTED NOT DETECTED Final  ?  Comment: (NOTE) ?The GeneXpert MRSA Assay (FDA approved for NASAL specimens only), ?is one component of a comprehensive MRSA colonization surveillance ?program. It is not intended to diagnose MRSA infection nor to guide ?or monitor treatment for MRSA infections. ?Test performance is not FDA approved in patients less than 2 years ?old. ?Performed at [facility], [place] [address] [place], [place] ?[address] ?  ?SARS Coronavirus 2 by RT PCR (hospital order, performed in [facility] hospital lab) Nasopharyngeal Nasopharyngeal Swab     Status: None  ? Collection Time: 08/06/2021  8:11 AM  ? Specimen: Nasopharyngeal Swab  ?Result Value Ref Range Status  ? SARS Coronavirus 2 NEGATIVE NEGATIVE Final  ?  Comment: (NOTE) ?SARS-CoV-2 target nucleic acids are NOT DETECTED. ? ?The SARS-CoV-2 RNA is generally detectable in upper and lower ?respiratory specimens during the acute phase of infection. The lowest ?concentration of SARS-CoV-2 viral copies  this assay can detect is 250 ?copies / mL. A negative result does not preclude SARS-CoV-2 infection ?and should not be used as the sole basis for treatment or other ?patient management decisions.  A negative result may occur with ?improper specimen collection / handling, submission of specimen other ?than nasopharyngeal swab, presence of viral mutation(s) within the ?areas targeted by this assay, and inadequate number of viral copies ?(<250 copies / mL). A negative result must be combined with clinical ?observations, patient history, and epidemiological information. ? ?Fact Sheet for Patients:   ?StrictlyIdeas.no ? ?Fact Sheet for Healthcare Providers: ?BankingDealers.co.za ? ?This test is not yet approved or  cleared by the [place] FDA and ?has been authorized for detection and/or diagnosis of SARS-CoV-2 by ?FDA under an Emergency Use Authorization (EUA).  This EUA will remain ?in effect (meaning this test can be used) for the

## 2021-07-28 NOTE — Care Management Important Message (Signed)
Important Message ? ?Patient Details IM Letter given to the Patient. ?Name: [name] ?MRN: [id number] ?Date of Birth: 08-03-1940 ? ? ?Medicare Important Message Given:  Yes ? ? ? ? ?Kerin Salen ?07/28/2021, 9:51 AM ?

## 2021-07-28 NOTE — Progress Notes (Signed)
? ?                                                                                                                                                     ?                                                   ?Daily Progress Note  ? ?Patient Name: [name]       Date: 07/28/2021 ?DOB: [birth year]  Age: 81 y.o. MRN#: [id number] ?Attending Physician: Damita Lack, MD ?Primary Care Physician: Janine Limbo, PA-C ?Admit Date: 07/16/2021 ? ?Reason for Consultation/Follow-up: Establishing goals of care ? ?Patient Profile/HPI:  [name] is a 81 y.o. male with a history of abdominal wall abscess, bladder cancer, CAD, carotid artery stenosis, diverticulitis, diabetes, hyperlipidemia, hypertension, PVD, subclavian steal syndrome. Patient presented from an outside hospital secondary to evidence of pulmonary and brain masses concerning for metastatic cancer. Patient transferred to [facility] for bronchoscopy and biopsy. Decadron started for associated brain edema.  S/p bronch on 4/7.  Await pathology.  Also await SNF placement.   ?  ?Palliative care has been asked to get involved to further aid in goals of care conversations in the setting of metastatic lung cancer. ?  ?Pathology has resulted with squamous cell lung cancer. Radiation and medical oncology were consulted. Attempts made to obtain MRI for possible SRS tx, or whole brain radiation however, unable to obtain due to patient unable to lie still. He is not a candidate for chemotherapy.  ? ?Subjective:\ ?Chart reviewed including labs, progress notes, imaging. Patient confused, states he just wants to be left alone. He is unable to participate in goals of care discussion. ?I met with patient's daughter and son in law. Patient's wife joined [place] later into the conference.  ?Daughter expressed that she had hoped patient's mental status would clear to the point that he could make his own decisions. ?We discussed what transition to comfort measures and Hospice  would look like. Managing symptoms of pain, agitation, anxiety, allowing for natural dying process.  ?Hospice philosophy and services were discussed.  ?He is currently on dexamethasone- decision made to stop the steroid and this may be adding to his agitation.  ?We discussed disposition- currently he is eating, able to ambulate and would not likely be candidate for residential hospice. However, in his current agitated state he would not be able to be adequately cared for in his home.  ?Plan made to stop steroid and attempt more aggressive management of his agitation- I suspect that with stopping the steroid he may become calmer and easier to manage at home- or he may have accelerated decompensation  due to brain swelling and will become eligible for hospice house placement.  ? ? ?Review of Systems  ?Unable to perform ROS: Mental status change  ? ? ?Physical Exam ?Vitals and nursing note reviewed.  ?Pulmonary:  ?   Effort: Pulmonary effort is normal.  ?         ? ?Vital Signs: BP 123/87 (BP Location: Left Arm)   Pulse 62   Temp 98.6 ?F (37 ?C) (Oral)   Resp 18   Ht _0  (1.88 m)   Wt 65.3 kg   SpO2 96%   BMI 18.49 kg/m?  ?SpO2: SpO2: 96 % ?O2 Device: O2 Device: Room Air ?O2 Flow Rate:   ? ?Intake/output summary:  ?Intake/Output Summary (Last 24 hours) at 07/28/2021 1709 ?Last data filed at 07/28/2021 1549 ?Gross per 24 hour  ?Intake 480 ml  ?Output 1000 ml  ?Net -520 ml  ? ?LBM: Last BM Date : 07/24/21 ?Baseline Weight: Weight: 62.1 kg ?Most recent weight: Weight: 65.3 kg ? ?     ?Palliative Assessment/Data: PPS: 20% ? ? ? ? ? ?Patient Active Problem List  ? Diagnosis Date Noted  ? Protein-calorie malnutrition, severe 07/22/2021  ? Mass of upper lobe of left lung 07/23/2021  ? Normocytic anemia 07/29/2021  ? Hyponatremia 07/24/2021  ? Metastasis to brain [facility]) 08/01/2021  ? Carotid artery stenosis   ? PVD (peripheral vascular disease) ([place]) 08/12/2017  ? Hernia, inguinal, left 07/18/2015  ? Groin pain 07/18/2015   ? Claudication ([place]) 01/02/2015  ? Colovesical fistula - diverticular s/p lap colectomy/repair 01/30/13 12/24/2012  ? Colocutaneous fistula - diverticular - s/p lap colectomy/repair 01/30/13 12/24/2012  ? Diverticulitis - recurrent 12/08/2012  ? Type 2 diabetes mellitus ([place]) 11/10/2012  ? Smoking 11/25/2011  ? Pre-operative clearance 05/08/2011  ? Bradycardia 11/27/2010  ? CAD (coronary artery disease) 11/27/2010  ? Hyperlipidemia 11/27/2010  ? HTN (hypertension) 11/27/2010  ? Subclavian steal syndrome 11/27/2010  ? ? ?Palliative Care Assessment & Plan  ? ? ?Assessment/Recommendations/Plan ? ?Transition to comfort measures ?D/C dexamethasone ?Morphine 29m IV q15 min prn for pain or SOB ?Seroquel QHS for agitation, sleep ?Lorazepam 271mIV for anxiety, agitation ?Safety sitter at bedside ?Patient may use symbicort from home to decrease his agitation ?Will eval for symptom management and possible dispositions in next 24-48 hours ?TOC consult to refer for [facility] inpatient facility is unclear at this point- see discussion above ? ? ?Code Status: ?DNR ? ?Prognosis: ? Unable to determine ? ?Discharge Planning: ?To Be Determined ? ?Care plan was discussed with patient and care team ? ?Thank you for allowing the Palliative Medicine Team to assist in the care of this patient. ? ?Total time 180 minutes ?KaMariana KaufmanAGNP-C ?Palliative Medicine ? ? ?Please contact Palliative Medicine Team phone at [phone] questions and concerns.  ? ? ? ? ? ? ?

## 2021-07-28 NOTE — Progress Notes (Signed)
While walking by the patient's room, this RN observed the patient trying to climb out of the bed, over the siderails. This RN entered the room to attempt to reorient that patient and get him to remain in bed, the patient became frustrated and pushed this RN's hand away. In an attempt to deescalate the situation, this RN called the NT Kennyth Lose to assist. NT attempted to reconnect suction tubing to primafit external male catheter, and the pt pushed the NT.  ? ?The patient continued to try to climb out of bed and pull the suction tubing loose. This RN provided redirection, but the patient continued to try to climb out of bed. The patient became frustrated again, and hit this RN on the hand.  ?

## 2021-07-29 DIAGNOSIS — Z515 Encounter for palliative care: Secondary | ICD-10-CM | POA: Diagnosis not present

## 2021-07-29 DIAGNOSIS — R918 Other nonspecific abnormal finding of lung field: Secondary | ICD-10-CM | POA: Diagnosis not present

## 2021-07-29 DIAGNOSIS — E43 Unspecified severe protein-calorie malnutrition: Secondary | ICD-10-CM

## 2021-07-29 DIAGNOSIS — Z7189 Other specified counseling: Secondary | ICD-10-CM | POA: Diagnosis not present

## 2021-07-29 MED ORDER — LORAZEPAM 2 MG/ML IJ SOLN
INTRAMUSCULAR | Status: AC
  Filled 2021-07-29: qty 1

## 2021-07-29 MED ORDER — LORAZEPAM 2 MG/ML IJ SOLN
2.0000 mg | INTRAMUSCULAR | Status: DC | PRN
  Administered 2021-07-29 (×2): 2 mg via INTRAVENOUS
  Filled 2021-07-29: qty 1

## 2021-07-29 MED ORDER — ORAL CARE MOUTH RINSE
15.0000 mL | Freq: Two times a day (BID) | OROMUCOSAL | Status: DC
  Administered 2021-07-29: 15 mL via OROMUCOSAL

## 2021-07-29 NOTE — Progress Notes (Signed)
Pt very agitated and made multiple attempts to get out of bed and remove primo fit.  He put both legs over the side rails multiple times and staff needed to be 1:1 with him until he fell asleep.  Soft belt restraint and mittens maintained.  Pt received Tylenol  with HS meds (included Buspar 15mg  and Seroquel 50mg ) at 2015.  He also received 1 dose of Morphine at 2130 until PRN dose of Ativan could be given at 2245.  He finally turned on his left side and has been sleeping since 2300.  He has made occasional adjustments in position through the night.  VS just checked and are WNL.  He continues to rest with easy respirations and no apparent distress noted. ?Ayesha Mohair BSN RN CMSRN ?07/29/2021, 6:15 AM ? ?

## 2021-07-29 NOTE — TOC Progression Note (Addendum)
Transition of Care (TOC) - Progression Note  ? ? ?Patient Details  ?Name: [name] ?MRN: [id number] ?Date of Birth: 03/09/1941 ? ?Transition of Care (TOC) CM/SW Contact  ?Tawanna Cooler, RN ?Phone Number: ?07/29/2021, 12:17 PM ? ?Clinical Narrative:    ? ?Received consult that patient/family have chosen [facility] [place], and are interested in inpatient residential hospice if available.  ?CM called [facility] [place], spoke with Fraser Din.  Gave referral and the request for possible residential.  Fraser Din states the facility in [facility] is full, but they may have availability in [place].  She has 2 referrals to complete ahead of this one, but patient referral given and in progress.   ? ? ?1538 Addendum: Spoke with hospice rep Pat.  She has spoken to the wife, who was very tearful.  Unable to send to the [place] location today, but could possibly transport there tomorrow if patient stable enough for transport.  States the reps tomorrow will be Warehouse manager.  Will touch base then.  ? ? ?Expected Discharge Plan: [place] ?Barriers to Discharge: Continued Medical Work up ? ?Expected Discharge Plan and Services ?Expected Discharge Plan: [place] ?  ?  ?  ?Living arrangements for the past 2 months: [place] ?                ?  ?

## 2021-07-29 NOTE — Discharge Summary (Signed)
Physician Discharge Summary  ?JAYDEE CONRAN DUK:[id number] DOB: 1940/06/24 DOA: 07/29/2021 ? ?PCP: Janine Limbo, PA-C ? ?Admit date: 07/19/2021 ?Discharge date: 07/29/2021 ? ?Admitted From: Home ?Disposition: Residential hospice ? ?Discharge Condition: Stable ?CODE STATUS: DNR ?Diet recommendation: Comfort feeding ? ?Brief/Interim Summary: ?81 y.o. male with a history of abdominal wall abscess, bladder cancer, CAD, carotid artery stenosis, diverticulitis, diabetes, hyperlipidemia, hypertension, PVD, subclavian steal syndrome. Patient presented from an outside hospital secondary to evidence of pulmonary and brain masses concerning for metastatic cancer. Patient transferred to [facility] for bronchoscopy and biopsy. Decadron started for associated brain edema.  S/p bronch on 4/7.  Pathology shows squamous cell lung primary.  MRI brain ordered to determine type of radiation.  ?Given poor prognosis and after discussion with palliative care service patient was transition to hospice care.  Due to severe agitation will require to be in hospice facility. ? ? ?  ?  ?Assessment & Plan: ? Principal Problem: ?  Mass of upper lobe of left lung ?Active Problems: ?  CAD (coronary artery disease) ?  Hyperlipidemia ?  HTN (hypertension) ?  Type 2 diabetes mellitus ([place]) ?  Normocytic anemia ?  Hyponatremia ?  Metastasis to brain [facility]) ?  Protein-calorie malnutrition, severe ?  ?  ?Left upper lobe from poorly differentiated squamous cell carcinoma of lung  ?S/p bronchoscopy and biopsy by pulmonary 4/7  ?Brain metastasis ?New diagnosis, after extensive discussion with multiple specialist including neurosurgery, oncology, radiation oncology and palliative care service patient was transitioned to comfort care. ?  ?CAD: No chest pain.  ?  ?Hypertension: Stop all home meds ?  ?Hyperlipidemia: Stop simvastatin and Lovaza ?  ?T2DM: We will stop all home meds ?   ?Anemia of chronic disease mildly stable  ?  ?Delirium: Stop all home  meds.  Can get as needed medications if necessary ?  ?Tobacco abuse:does not plan to stop smoking ?  ?AKI: Stop home meds ?  ?Hyponatremia:poor oral intake ?  ?Severe malnutrition continue to augment nutrition status as tolerated ?Nutrition Status: ?Nutrition Problem: Severe Malnutrition ?Etiology: chronic illness (emphysema, lung mass, intracranial metastasis) ?Signs/Symptoms: severe fat depletion, severe muscle depletion ?Interventions: Ensure Enlive (each supplement provides 350kcal and 20 grams of protein), MVI, Liberalize Diet, Education ?  ?Appreciate input from palliative care services.  Patient has very poor prognosis.  Transition to hospice facility ? ? ? ? ?Consultations: ?Neurosurgery ?Oncology ?Radiation oncology ?Palliative care service ? ?Subjective: ?Laying comfortably.  He received Ativan this morning for agitation. ? ?Discharge Exam: ?Vitals:  ? 07/28/21 2013 07/29/21 3762  ?BP: 119/81 (!) 129/46  ?Pulse: 79 (!) 51  ?Resp: 18 14  ?Temp: 98.5 ?F (36.9 ?C) (!) 97.5 ?F (36.4 ?C)  ?SpO2: 100% 99%  ? ?Vitals:  ? 07/28/21 0356 07/28/21 1320 07/28/21 2013 07/29/21 0607  ?BP: (!) 188/69 123/87 119/81 (!) 129/46  ?Pulse: 76 62 79 (!) 51  ?Resp: 16 18 18 14   ?Temp: (!) 97.5 ?F (36.4 ?C) 98.6 ?F (37 ?C) 98.5 ?F (36.9 ?C) (!) 97.5 ?F (36.4 ?C)  ?TempSrc: Oral Oral Oral Oral  ?SpO2: 96% 96% 100% 99%  ?Weight:      ?Height:      ? ? ? ?Discharge Instructions ? ? ?Allergies as of 07/29/2021   ? ?   Reactions  ? Dilaudid [hydromorphone Hcl] Other (See Comments)  ? Very sedated with Dilaudid.  Tolerates fentanyl better  ? Hydromorphone   ? Other reaction(s): Other (See Comments) ?Very sedated with Dilaudid.  Tolerates fentanyl better  ? Other Other (See Comments)  ? Wasp Venom Other (See Comments)  ? ?  ? ?  ?Medication List  ?  ? ?STOP taking these medications   ? ?aspirin 81 MG tablet ?  ?busPIRone 15 MG tablet ?Commonly known as: BUSPAR ?  ?fish oil-omega-3 fatty acids 1000 MG capsule ?  ?hydrALAZINE 50 MG  tablet ?Commonly known as: APRESOLINE ?  ?losartan 100 MG tablet ?Commonly known as: COZAAR ?  ?metoprolol tartrate 50 MG tablet ?Commonly known as: LOPRESSOR ?  ?simvastatin 40 MG tablet ?Commonly known as: ZOCOR ?  ?Symbicort 160-4.5 MCG/ACT inhaler ?Generic drug: budesonide-formoterol ?  ?tamsulosin 0.4 MG Caps capsule ?Commonly known as: FLOMAX ?  ? ?  ? ? ?Allergies  ?Allergen Reactions  ? Dilaudid [Hydromorphone Hcl] Other (See Comments)  ?  Very sedated with Dilaudid.  Tolerates fentanyl better  ? Hydromorphone   ?  Other reaction(s): Other (See Comments) ?Very sedated with Dilaudid.  Tolerates fentanyl better  ? Other Other (See Comments)  ? Wasp Venom Other (See Comments)  ? ? ?You were cared for by a hospitalist during your hospital stay. If you have any questions about your discharge medications or the care you received while you were in the hospital after you are discharged, you can call the unit and asked to speak with the hospitalist on call if the hospitalist that took care of you is not available. Once you are discharged, your primary care physician will handle any further medical issues. Please note that no refills for any discharge medications will be authorized once you are discharged, as it is imperative that you return to your primary care physician (or establish a relationship with a primary care physician if you do not have one) for your aftercare needs so that they can reassess your need for medications and monitor your lab values. ? ? ?Procedures/Studies: ?DG Chest Port 1 View ? ?Result Date: 07/27/2021 ?CLINICAL DATA:  Chest pain. EXAM: PORTABLE CHEST 1 VIEW COMPARISON:  Chest x-ray 07/16/2021.  CT of the chest 08/12/2021. FINDINGS: Left upper lobe mass has not significantly changed. No new focal lung infiltrate. Cardiomediastinal silhouette within normal limits. No acute fractures. IMPRESSION: 1. Left upper lobe mass has not significantly changed. 2. No other focal lung infiltrate.  Electronically Signed   By: Ronney Asters M.D.   On: 07/27/2021 18:36  ? ?DG CHEST PORT 1 VIEW ? ?Result Date: 08/11/2021 ?CLINICAL DATA:  Left upper lobe mass, preop for bronchoscopy EXAM: PORTABLE CHEST 1 VIEW COMPARISON:  Chest radiograph 07/19/2021 FINDINGS: Cardiomediastinal silhouette is stable. The approximately 4.2 cm left perihilar mass is again seen, not significantly changed. There is no new or worsening focal airspace disease. There is no pulmonary edema. There is no significant pleural effusion. There is no pneumothorax There is no acute osseous abnormality. IMPRESSION: Unchanged left perihilar mass. No new or worsening focal airspace disease. Electronically Signed   By: Valetta Mole M.D.   On: 08/02/2021 11:35  ? ?CT Super D Chest Wo Contrast ? ?Result Date: 07/23/2021 ?CLINICAL DATA:  History of left upper lobe mass in an 81 year old male EXAM: CT CHEST WITHOUT CONTRAST TECHNIQUE: Multidetector CT imaging of the chest was performed using thin slice collimation for electromagnetic bronchoscopy planning purposes, without intravenous contrast. RADIATION DOSE REDUCTION: This exam was performed according to the departmental dose-optimization program which includes automated exposure control, adjustment of the mA and/or kV according to patient size and/or use of iterative reconstruction technique. COMPARISON:  July 19, 2021. FINDINGS: Cardiovascular: Calcified aortic atherosclerosis. No aneurysmal dilation of the thoracic aorta. Mild dilation of central pulmonary vessels. Calcified coronary artery disease, three-vessel coronary artery disease. Limited assessment of cardiovascular structures given lack of intravenous contrast. Heart size is normal without pericardial effusion. Mediastinum/Nodes: No thoracic inlet adenopathy. No axillary adenopathy. Enlarged AP window lymph node measuring 16 mm (image 74/3) fullness of LEFT hilar nodal tissue is contiguous with LEFT upper lobe mass. No RIGHT hilar nodal  enlargement grossly on noncontrast imaging. No subcarinal adenopathy or RIGHT paratracheal lymphadenopathy. No internal mammary adenopathy. Lungs/Pleura: LEFT upper lobe mass measuring 6.5 x 3.3 cm showing low-density. LEFT hila

## 2021-07-29 NOTE — Progress Notes (Signed)
Pt's respirations about 40/min with occasional irregularity.  PRN Morphine given with some improvement, but respirations remain 30-40.  Pt's wife arrived to see pt and provided update on pt's status.  I discussed with her the change in pt's breathing and decrease in pt's responsiveness.  She stated she wanted to be with him when he passed away.  I encouraged her to stay with him tonight, due to the changes in his condition, but did discuss that there is no way to give an exact time frame.  Emotional support given.   ?Ayesha Mohair BSN RN CMSRN ?07/29/2021, 10:03 PM ? ?

## 2021-07-29 NOTE — Care Plan (Signed)
Called patients daughter, Suanne Marker, gave update regarding patient status. ? ?Ethlyn Daniels, RN, BSN ? ?

## 2021-07-29 NOTE — Progress Notes (Signed)
? ?                                                                                                                                                     ?                                                   ?Daily Progress Note  ? ?Patient Name: [name]       Date: 07/29/2021 ?DOB: 08-Apr-1941  Age: 81 y.o. MRN#: [id number] ?Attending Physician: Damita Lack, MD ?Primary Care Physician: Janine Limbo, PA-C ?Admit Date: 08/02/2021 ? ?Reason for Consultation/Follow-up: Establishing goals of care ? ?Patient Profile/HPI:  [name] is a 81 y.o. male with a history of abdominal wall abscess, bladder cancer, CAD, carotid artery stenosis, diverticulitis, diabetes, hyperlipidemia, hypertension, PVD, subclavian steal syndrome. Patient presented from an outside hospital secondary to evidence of pulmonary and brain masses concerning for metastatic cancer. Patient transferred to [facility] for bronchoscopy and biopsy. Decadron started for associated brain edema.  S/p bronch on 4/7.  Await pathology.  initially was awaiting SNF placement.   ?  ?Palliative care has been asked to get involved to further aid in goals of care conversations in the setting of metastatic lung cancer. ?  ?Pathology has resulted with squamous cell lung cancer. Radiation and medical oncology were consulted. Attempts made to obtain MRI for possible SRS tx, or whole brain radiation however, unable to obtain due to patient unable to lie still. He is not a candidate for chemotherapy.  ? ?Subjective: ?Patient is restless, attempts to move around in bed. Mild distress noted.  ?  ? ?Review of Systems  ?Unable to perform ROS: Mental status change  ? ? ?Physical Exam ?Vitals and nursing note reviewed.  ?Pulmonary:  ?   Effort: Pulmonary effort is normal.  ? Coarse breath sounds ?Not awake not alert ?Mild distress        ? ?Vital Signs: BP (!) 129/46 (BP Location: Right Arm)   Pulse (!) 51   Temp (!) 97.5 ?F (36.4 ?C) (Oral)   Resp 14   Ht  6\' 2"  (1.88 m)   Wt 65.3 kg   SpO2 99%   BMI 18.49 kg/m?  ?SpO2: SpO2: 99 % ?O2 Device: O2 Device: Room Air ?O2 Flow Rate:   ? ?Intake/output summary:  ?Intake/Output Summary (Last 24 hours) at 07/29/2021 1201 ?Last data filed at 07/28/2021 2015 ?Gross per 24 hour  ?Intake 840 ml  ?Output 1050 ml  ?Net -210 ml  ? ? ?LBM: Last BM Date : 07/24/21 ?Baseline Weight: Weight: 62.1 kg ?Most recent weight: Weight: 65.3 kg ? ?     ?  Palliative Assessment/Data: PPS: 20% ? ? ? ? ? ?Patient Active Problem List  ? Diagnosis Date Noted  ? Protein-calorie malnutrition, severe 07/22/2021  ? Mass of upper lobe of left lung 07/15/2021  ? Normocytic anemia 07/31/2021  ? Hyponatremia 08/09/2021  ? Metastasis to brain [facility]) 07/24/2021  ? Carotid artery stenosis   ? PVD (peripheral vascular disease) ([place]) 08/12/2017  ? Hernia, inguinal, left 07/18/2015  ? Groin pain 07/18/2015  ? Claudication ([place]) 01/02/2015  ? Colovesical fistula - diverticular s/p lap colectomy/repair 01/30/13 12/24/2012  ? Colocutaneous fistula - diverticular - s/p lap colectomy/repair 01/30/13 12/24/2012  ? Diverticulitis - recurrent 12/08/2012  ? Type 2 diabetes mellitus ([place]) 11/10/2012  ? Smoking 11/25/2011  ? Pre-operative clearance 05/08/2011  ? Bradycardia 11/27/2010  ? CAD (coronary artery disease) 11/27/2010  ? Hyperlipidemia 11/27/2010  ? HTN (hypertension) 11/27/2010  ? Subclavian steal syndrome 11/27/2010  ? ? ?Palliative Care Assessment & Plan  ? ? ?Assessment/Recommendations/Plan ? ? comfort measures ?Now off dexamethasone ?Morphine 2mg  IV q15 min prn for pain or SOB ?Seroquel QHS for agitation, sleep ?Lorazepam 2mg  IV for anxiety, agitation ?Safety sitter at bedside ?Agree with [facility] [place], as per discussions with [facility] MD colleague Dr Reesa Chew.  ? ?Code Status: ?DNR ? ?Prognosis: ? Hours to days ? ?Discharge Planning: ?Residential hospice.  ? ?Care plan was discussed with [facility] MD and care team ? ?Thank you for allowing the Palliative  Medicine Team to assist in the care of this patient. ? ?15 minutes ?  ?Greater than 50%  of this time was spent counseling and coordinating care related to the above assessment and plan. ?  ?Loistine Chance MD ?Palliative Medicine ? ? ?Please contact Palliative Medicine Team phone at [phone] for questions and concerns.  ? ? ? ? ? ? ?

## 2021-07-30 DIAGNOSIS — R918 Other nonspecific abnormal finding of lung field: Secondary | ICD-10-CM | POA: Diagnosis not present

## 2021-07-30 MED ORDER — NALOXONE HCL 0.4 MG/ML IJ SOLN: 0.4000 mg | INTRAMUSCULAR | Status: DC | PRN

## 2021-07-30 MED ORDER — DIPHENHYDRAMINE HCL 12.5 MG/5ML PO ELIX: 12.5000 mg | ORAL_SOLUTION | Freq: Four times a day (QID) | ORAL | Status: DC | PRN

## 2021-07-30 MED ORDER — MORPHINE SULFATE 1 MG/ML IV SOLN PCA: INTRAVENOUS | Status: DC

## 2021-07-30 MED ORDER — DIPHENHYDRAMINE HCL 50 MG/ML IJ SOLN: 12.5000 mg | Freq: Four times a day (QID) | INTRAMUSCULAR | Status: DC | PRN

## 2021-07-30 MED ORDER — SODIUM CHLORIDE 0.9% FLUSH: 9.0000 mL | INTRAVENOUS | Status: DC | PRN

## 2021-07-30 MED ORDER — ONDANSETRON HCL 4 MG/2ML IJ SOLN: 4.0000 mg | Freq: Four times a day (QID) | INTRAMUSCULAR | Status: DC | PRN

## 2021-07-31 ENCOUNTER — Ambulatory Visit: Payer: Medicare Other

## 2021-08-01 ENCOUNTER — Ambulatory Visit: Payer: Medicare Other

## 2021-08-02 ENCOUNTER — Ambulatory Visit: Payer: Medicare Other

## 2021-08-03 ENCOUNTER — Ambulatory Visit: Payer: Medicare Other

## 2021-08-04 ENCOUNTER — Ambulatory Visit: Payer: Medicare Other

## 2021-08-07 ENCOUNTER — Ambulatory Visit: Payer: Medicare Other

## 2021-08-08 ENCOUNTER — Ambulatory Visit: Payer: Medicare Other

## 2021-08-09 ENCOUNTER — Ambulatory Visit: Payer: Medicare Other

## 2021-08-14 NOTE — Progress Notes (Signed)
PMT no charge note. ? ?Chart reviewed, discussed with [facility] MD colleague about initiating continuous opioid infusion for comfort care and that the patient might no longer be appropriate for transfer to residential hospice.  ? ?I arrived at bedside, patient was pronounced dead at 1015 on 07-Aug-2021. I met with his wife and offered my condolences. They have been married for over 55 years. Wife is tearful. Offered supportive presence and compassionate care.  ? ?No charge.  ?Loistine Chance MD ?[facility] palliative team ?[phone]  ? ?

## 2021-08-14 NOTE — TOC Progression Note (Signed)
Transition of Care (TOC) - Progression Note  ? ? ?Patient Details  ?Name: [name] ?MRN: [id number] ?Date of Birth: 1941-02-06 ? ?Transition of Care (TOC) CM/SW Contact  ?Tawanna Cooler, RN ?Phone Number: ?Aug 14, 2021, 10:48 AM ? ?Clinical Narrative:    ? ?Spoke with [facility].  She states she will reach out to the patient's wife and let me know the plan.   ?

## 2021-08-14 NOTE — Death Summary Note (Signed)
? ?DEATH SUMMARY  ? ?Patient Details  ?Name: [name] ?MRN: [id number] ?DOB: October 10, 1940 ?PCP:O'Buch, Alvis Lemmings, PA-C ?Admission/Discharge Information  ? ?Admit Date:  08-08-21  ?Date of Death: Date of Death: Aug 18, 2021  ?Time of Death: Time of Death: 1941  ?Length of Stay: 9  ? ?Principle Cause of death: Metastatic lung cancer to the brain ? ?Hospital Diagnoses: ?Principal Problem: ?  Mass of upper lobe of left lung ?Active Problems: ?  CAD (coronary artery disease) ?  Hyperlipidemia ?  HTN (hypertension) ?  Type 2 diabetes mellitus ([place]) ?  Normocytic anemia ?  Hyponatremia ?  Metastasis to brain [facility]) ?  Protein-calorie malnutrition, severe ? ? ?Hospital Course: ?81 y.o. male with a history of abdominal wall abscess, bladder cancer, CAD, carotid artery stenosis, diverticulitis, diabetes, hyperlipidemia, hypertension, PVD, subclavian steal syndrome. Patient presented from an outside hospital secondary to evidence of pulmonary and brain masses concerning for metastatic cancer. Patient transferred to [facility] for bronchoscopy and biopsy. Decadron started for associated brain edema.  S/p bronch on 4/7.  Pathology shows squamous cell lung primary.  MRI brain ordered to determine type of radiation.  ?Given poor prognosis and after discussion with palliative care service patient was transition to hospice care.  Hospice facility was arranged but due to worsening of his condition he was placed on morphine drip.  Soon after patient passed away.  Wife was present at bedside. ?  ?  ?  ?Assessment & Plan: ? Principal Problem: ?  Mass of upper lobe of left lung ?Active Problems: ?  CAD (coronary artery disease) ?  Hyperlipidemia ?  HTN (hypertension) ?  Type 2 diabetes mellitus ([place]) ?  Normocytic anemia ?  Hyponatremia ?  Metastasis to brain [facility]) ?  Protein-calorie malnutrition, severe ?  ?  ?Left upper lobe from poorly differentiated squamous cell carcinoma of lung  ?S/p bronchoscopy and biopsy by pulmonary 4/7   ?Brain metastasis ?New diagnosis, after extensive discussion with multiple specialist including neurosurgery, oncology, radiation oncology and palliative care service patient was transitioned to comfort care. ?  ?CAD: No chest pain.  ?  ?Hypertension: Stop all home meds ?  ?Hyperlipidemia: Stop simvastatin and Lovaza ?  ?T2DM: We will stop all home meds ?   ?Anemia of chronic disease mildly stable  ?  ?Delirium: Stop all home meds.  Can get as needed medications if necessary ?  ?Tobacco abuse:does not plan to stop smoking ?  ?AKI: Stop home meds ?  ?Hyponatremia:poor oral intake ?  ?Severe malnutrition continue to augment nutrition status as tolerated ?Nutrition Status: ?Nutrition Problem: Severe Malnutrition ?Etiology: chronic illness (emphysema, lung mass, intracranial metastasis) ?Signs/Symptoms: severe fat depletion, severe muscle depletion ?Interventions: Ensure Enlive (each supplement provides 350kcal and 20 grams of protein), MVI, Liberalize Diet, Education ?  ?  ?  ? ? ?The results of significant diagnostics from this hospitalization (including imaging, microbiology, ancillary and laboratory) are listed below for reference.  ? ?Significant Diagnostic Studies: ?DG Chest Port 1 View ? ?Result Date: 07/27/2021 ?CLINICAL DATA:  Chest pain. EXAM: PORTABLE CHEST 1 VIEW COMPARISON:  Chest x-ray 07/27/2021.  CT of the chest 08/06/2021. FINDINGS: Left upper lobe mass has not significantly changed. No new focal lung infiltrate. Cardiomediastinal silhouette within normal limits. No acute fractures. IMPRESSION: 1. Left upper lobe mass has not significantly changed. 2. No other focal lung infiltrate. Electronically Signed   By: Ronney Asters M.D.   On: 07/27/2021 18:36  ? ?DG CHEST PORT 1 VIEW ? ?  Result Date: 07/27/2021 ?CLINICAL DATA:  Left upper lobe mass, preop for bronchoscopy EXAM: PORTABLE CHEST 1 VIEW COMPARISON:  Chest radiograph 07/19/2021 FINDINGS: Cardiomediastinal silhouette is stable. The approximately 4.2 cm  left perihilar mass is again seen, not significantly changed. There is no new or worsening focal airspace disease. There is no pulmonary edema. There is no significant pleural effusion. There is no pneumothorax There is no acute osseous abnormality. IMPRESSION: Unchanged left perihilar mass. No new or worsening focal airspace disease. Electronically Signed   By: Valetta Mole M.D.   On: 08/09/2021 11:35  ? ?CT Super D Chest Wo Contrast ? ?Result Date: 07/17/2021 ?CLINICAL DATA:  History of left upper lobe mass in an 81 year old male EXAM: CT CHEST WITHOUT CONTRAST TECHNIQUE: Multidetector CT imaging of the chest was performed using thin slice collimation for electromagnetic bronchoscopy planning purposes, without intravenous contrast. RADIATION DOSE REDUCTION: This exam was performed according to the departmental dose-optimization program which includes automated exposure control, adjustment of the mA and/or kV according to patient size and/or use of iterative reconstruction technique. COMPARISON:  July 19, 2021. FINDINGS: Cardiovascular: Calcified aortic atherosclerosis. No aneurysmal dilation of the thoracic aorta. Mild dilation of central pulmonary vessels. Calcified coronary artery disease, three-vessel coronary artery disease. Limited assessment of cardiovascular structures given lack of intravenous contrast. Heart size is normal without pericardial effusion. Mediastinum/Nodes: No thoracic inlet adenopathy. No axillary adenopathy. Enlarged AP window lymph node measuring 16 mm (image 74/3) fullness of LEFT hilar nodal tissue is contiguous with LEFT upper lobe mass. No RIGHT hilar nodal enlargement grossly on noncontrast imaging. No subcarinal adenopathy or RIGHT paratracheal lymphadenopathy. No internal mammary adenopathy. Lungs/Pleura: LEFT upper lobe mass measuring 6.5 x 3.3 cm showing low-density. LEFT hilar nodal tissue seen on previous CT acquired on April 5th is not as well seen as on the contrasted study  which is available for comparison. Peripheral tree in bud opacities are noted in the anterior LEFT upper lobe and along the periphery of the LEFT upper lobe tracking laterally in the chest these findings are seen on image 97 and images 84-273 showing no change. Basilar airspace disease is dependent in medial and there are signs of material in RIGHT and LEFT mainstem bronchi. Marked pulmonary emphysema. No pleural effusion. Upper Abdomen: Incidental imaging of upper abdominal contents without acute process. Cholelithiasis and or sludge in the gallbladder. Imaged portions of pancreas, spleen, adrenal glands and kidneys are unremarkable. No acute finding related to visualized gastrointestinal tract and no upper abdominal lymphadenopathy. Musculoskeletal: No acute musculoskeletal process. No destructive bone finding. Spinal degenerative changes. IMPRESSION: LEFT upper lobe mass with peripheral postobstructive changes and LEFT hilar as well as AP window adenopathy as seen on the recent chest CT remaining highly suspicious for a not diagnostic of bronchogenic neoplasm. Aortic atherosclerosis and coronary artery disease. Signs of aspiration related changes in the lung bases based on distribution and fluid in the bronchi. Aortic Atherosclerosis (ICD10-I70.0). Electronically Signed   By: Zetta Bills M.D.   On: 08/09/2021 09:57  ? ?DG C-ARM BRONCHOSCOPY ? ?Result Date: 07/24/2021 ?C-ARM BRONCHOSCOPY: Fluoroscopy was utilized by the requesting physician.  No radiographic interpretation.   ? ?Microbiology: ?Recent Results (from the past 240 hour(s))  ?MRSA Next Gen by PCR, Nasal     Status: None  ? Collection Time: 08/12/2021  8:33 PM  ? Specimen: Nasal Mucosa; Nasal Swab  ?Result Value Ref Range Status  ? MRSA by PCR Next Gen NOT DETECTED NOT DETECTED Final  ?  Comment: (NOTE) ?  The GeneXpert MRSA Assay (FDA approved for NASAL specimens only), ?is one component of a comprehensive MRSA colonization surveillance ?program. It is  not intended to diagnose MRSA infection nor to guide ?or monitor treatment for MRSA infections. ?Test performance is not FDA approved in patients less than 2 years ?old. ?Performed at [facility]

## 2021-08-14 DEATH — deceased

## 2021-08-24 ENCOUNTER — Ambulatory Visit: Payer: Medicare Other | Admitting: Cardiology

## 2021-08-30 ENCOUNTER — Encounter (HOSPITAL_COMMUNITY): Payer: Self-pay
